# Patient Record
Sex: Male | Born: 1955 | Race: White | Hispanic: No | Marital: Single | Smoking: Never smoker
Health system: Southern US, Community
[De-identification: ages and names within clinical notes are randomized; demographics above are authoritative.]

## PROBLEM LIST (undated history)

## (undated) DIAGNOSIS — M1990 Unspecified osteoarthritis, unspecified site: Secondary | ICD-10-CM

## (undated) DIAGNOSIS — H269 Unspecified cataract: Secondary | ICD-10-CM

## (undated) DIAGNOSIS — K219 Gastro-esophageal reflux disease without esophagitis: Secondary | ICD-10-CM

## (undated) DIAGNOSIS — I10 Essential (primary) hypertension: Secondary | ICD-10-CM

## (undated) DIAGNOSIS — F419 Anxiety disorder, unspecified: Secondary | ICD-10-CM

## (undated) DIAGNOSIS — J069 Acute upper respiratory infection, unspecified: Secondary | ICD-10-CM

## (undated) DIAGNOSIS — E785 Hyperlipidemia, unspecified: Secondary | ICD-10-CM

## (undated) DIAGNOSIS — J189 Pneumonia, unspecified organism: Secondary | ICD-10-CM

## (undated) DIAGNOSIS — C801 Malignant (primary) neoplasm, unspecified: Secondary | ICD-10-CM

## (undated) HISTORY — PX: SHOULDER ARTHROSCOPY: SHX128

## (undated) HISTORY — PX: ELBOW SURGERY: SHX618

## (undated) HISTORY — DX: Hyperlipidemia, unspecified: E78.5

## (undated) HISTORY — PX: COLONOSCOPY: SHX174

## (undated) HISTORY — DX: Unspecified cataract: H26.9

## (undated) HISTORY — DX: Malignant (primary) neoplasm, unspecified: C80.1

## (undated) HISTORY — DX: Essential (primary) hypertension: I10

## (undated) HISTORY — DX: Unspecified osteoarthritis, unspecified site: M19.90

## (undated) HISTORY — PX: KNEE ARTHROSCOPY: SUR90

## (undated) HISTORY — PX: CATARACT EXTRACTION, BILATERAL: SHX1313

---

## 2000-06-26 ENCOUNTER — Encounter: Payer: Self-pay | Admitting: Orthopedic Surgery

## 2000-06-26 ENCOUNTER — Encounter: Admission: RE | Admit: 2000-06-26 | Discharge: 2000-06-26 | Payer: Self-pay | Admitting: Orthopedic Surgery

## 2000-12-01 ENCOUNTER — Encounter: Admission: RE | Admit: 2000-12-01 | Discharge: 2000-12-01 | Payer: Self-pay | Admitting: Orthopedic Surgery

## 2000-12-01 ENCOUNTER — Encounter: Payer: Self-pay | Admitting: Orthopedic Surgery

## 2003-11-01 ENCOUNTER — Encounter: Admission: RE | Admit: 2003-11-01 | Discharge: 2003-11-01 | Payer: Self-pay | Admitting: Orthopedic Surgery

## 2004-01-30 ENCOUNTER — Ambulatory Visit (HOSPITAL_COMMUNITY): Admission: RE | Admit: 2004-01-30 | Discharge: 2004-01-30 | Payer: Self-pay | Admitting: Orthopedic Surgery

## 2004-01-30 ENCOUNTER — Ambulatory Visit (HOSPITAL_BASED_OUTPATIENT_CLINIC_OR_DEPARTMENT_OTHER): Admission: RE | Admit: 2004-01-30 | Discharge: 2004-01-30 | Payer: Self-pay | Admitting: Orthopedic Surgery

## 2005-12-24 ENCOUNTER — Encounter: Admission: RE | Admit: 2005-12-24 | Discharge: 2005-12-24 | Payer: Self-pay | Admitting: Family Medicine

## 2008-12-20 ENCOUNTER — Encounter: Admission: RE | Admit: 2008-12-20 | Discharge: 2008-12-20 | Payer: Self-pay | Admitting: Occupational Medicine

## 2008-12-21 ENCOUNTER — Encounter: Admission: RE | Admit: 2008-12-21 | Discharge: 2008-12-21 | Payer: Self-pay | Admitting: Internal Medicine

## 2009-06-16 ENCOUNTER — Emergency Department (HOSPITAL_COMMUNITY): Admission: EM | Admit: 2009-06-16 | Discharge: 2009-06-16 | Payer: Self-pay | Admitting: Emergency Medicine

## 2009-06-16 ENCOUNTER — Ambulatory Visit: Payer: Self-pay | Admitting: *Deleted

## 2009-06-16 ENCOUNTER — Inpatient Hospital Stay (HOSPITAL_COMMUNITY): Admission: EM | Admit: 2009-06-16 | Discharge: 2009-06-17 | Payer: Self-pay | Admitting: *Deleted

## 2010-12-15 ENCOUNTER — Emergency Department (HOSPITAL_COMMUNITY)
Admission: EM | Admit: 2010-12-15 | Discharge: 2010-12-15 | Disposition: A | Discharge status: Home / Self Care | Payer: 59 | Attending: Emergency Medicine | Admitting: Emergency Medicine

## 2010-12-15 DIAGNOSIS — N342 Other urethritis: Secondary | ICD-10-CM | POA: Insufficient documentation

## 2010-12-15 DIAGNOSIS — R369 Urethral discharge, unspecified: Secondary | ICD-10-CM | POA: Insufficient documentation

## 2010-12-15 DIAGNOSIS — I10 Essential (primary) hypertension: Secondary | ICD-10-CM | POA: Insufficient documentation

## 2010-12-15 DIAGNOSIS — R300 Dysuria: Secondary | ICD-10-CM | POA: Insufficient documentation

## 2010-12-15 LAB — URINALYSIS, ROUTINE W REFLEX MICROSCOPIC
Hgb urine dipstick: NEGATIVE
Protein, ur: NEGATIVE mg/dL
Urobilinogen, UA: 1 mg/dL (ref 0.0–1.0)

## 2010-12-18 LAB — GC/CHLAMYDIA PROBE AMP, GENITAL: GC Probe Amp, Genital: NEGATIVE

## 2011-01-17 LAB — DIFFERENTIAL
Basophils Relative: 0 % (ref 0–1)
Eosinophils Absolute: 0.1 10*3/uL (ref 0.0–0.7)
Eosinophils Relative: 2 % (ref 0–5)
Monocytes Relative: 9 % (ref 3–12)
Neutrophils Relative %: 63 % (ref 43–77)

## 2011-01-17 LAB — RAPID URINE DRUG SCREEN, HOSP PERFORMED
Amphetamines: NOT DETECTED
Barbiturates: NOT DETECTED
Cocaine: NOT DETECTED
Opiates: NOT DETECTED
Tetrahydrocannabinol: NOT DETECTED

## 2011-01-17 LAB — POCT I-STAT, CHEM 8
Calcium, Ion: 1.15 mmol/L (ref 1.12–1.32)
Glucose, Bld: 93 mg/dL (ref 70–99)
HCT: 51 % (ref 39.0–52.0)
Hemoglobin: 17.3 g/dL — ABNORMAL HIGH (ref 13.0–17.0)
Potassium: 4 mEq/L (ref 3.5–5.1)
TCO2: 27 mmol/L (ref 0–100)

## 2011-01-17 LAB — CBC
MCHC: 34.6 g/dL (ref 30.0–36.0)
MCV: 92 fL (ref 78.0–100.0)
RBC: 5.24 MIL/uL (ref 4.22–5.81)

## 2011-01-17 LAB — URINALYSIS, ROUTINE W REFLEX MICROSCOPIC
Bilirubin Urine: NEGATIVE
Glucose, UA: NEGATIVE mg/dL
Hgb urine dipstick: NEGATIVE
Specific Gravity, Urine: 1.012 (ref 1.005–1.030)
pH: 7 (ref 5.0–8.0)

## 2011-02-28 NOTE — Op Note (Signed)
NAME:  Wayne Rhodes, ALEN NO.:  [id number]   MEDICAL RECORD NO.:  [id number]                   PATIENT TYPE:  AMB   LOCATION:  [facility]                                 FACILITY:  [facility]   PHYSICIAN:  Almedia Balls. Ranell Patrick, M.D.              DATE OF BIRTH:  24-Nov-1955   DATE OF PROCEDURE:  01/30/2004  DATE OF DISCHARGE:                                 OPERATIVE REPORT   PREOPERATIVE DIAGNOSES:  Left knee medial meniscus tear.   POSTOPERATIVE DIAGNOSES:  Left knee medial meniscus tear.   PROCEDURE:  Left knee arthroscopy with partial medial meniscectomy.   SURGEON:  Almedia Balls. Ranell Patrick, M.D.   ASSISTANT:  None.   ANESTHESIA:  Local plus Mac.   ESTIMATED BLOOD LOSS:  Minimal.   FLUIDS REPLACED:  800 mL crystalloid.   INSTRUMENT COUNT:  Correct.   COMPLICATIONS:  None.   INDICATIONS FOR PROCEDURE:  The patient is a 55 year old male who complains  of persistent medial pain after twisting injury at work.  The patient has a  MRI demonstrating possible ACL tear and meniscus tear. The patient presents  with persistent complaints despite conservative management and desires  surgical management with arthroscopy.  Informed consent was obtained.   DESCRIPTION OF PROCEDURE:  After an adequate level of anesthesia was  obtained, the patient was positioned supine on the operating table.  The  left leg was placed in an arthroscopic leg holder, right leg placed in a  well leg holder. The left leg was sterilely prepped and draped.  Diagnostic  arthroscopy was carried out through standard arthroscopic portals.  Superolateral outflow, anterolateral scope, and anteromedial working portals  all created in a similar fashion.  With infiltration of the skin with 0.25%  Marcaine with epinephrine, followed by incision with an 11 blade scalpel,  intra-articular cannula, and using blunt obturators.  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 The medial and lateral gutter was free of loose  bodies. The medial compartment was inspected, there was noted to be grade 3  changes on the femoral condyle mostly posterior deflection and some grade 2  changes on the tibial condyle.  The medial meniscus was torn, this was  complex horizontal cleavage type tear.  This was debrided mostly the tibial  portion of that tear back to stable meniscal tear. There was still femoral  sided leaf remaining intact.  That is about 20-30% of the posterior horn of  the medial meniscus had to be removed. There was some slight abrasion  chondroplasty performed removing loose flaps of cartilage on the medial  femoral condyle. The ACL was noted to be completely intact going to its  normal over the top position under normal tension.  PCL  intact, lateral compartment pristine.  At this point,  after completion of  the partial medial meniscectomy the scope was concluded.  The wounds were  closed using 4-0 Monocryl subcuticular stitches followed by sterile dressing  and Ace wrap above the knee.  The patient tolerated the surgery well.                                               Almedia Balls. Ranell Patrick, M.D.    SRN/MEDQ  D:  01/30/2004  T:  01/31/2004  Job:  [id number]

## 2011-05-09 ENCOUNTER — Other Ambulatory Visit: Payer: Self-pay | Admitting: Orthopedic Surgery

## 2011-05-09 DIAGNOSIS — M542 Cervicalgia: Secondary | ICD-10-CM

## 2011-05-11 ENCOUNTER — Ambulatory Visit
Admission: RE | Admit: 2011-05-11 | Discharge: 2011-05-11 | Disposition: A | Discharge status: Home / Self Care | Payer: 59 | Source: Ambulatory Visit | Attending: Orthopedic Surgery | Admitting: Orthopedic Surgery

## 2011-05-11 DIAGNOSIS — M542 Cervicalgia: Secondary | ICD-10-CM

## 2011-06-18 ENCOUNTER — Encounter: Payer: Self-pay | Admitting: Family Medicine

## 2011-06-18 ENCOUNTER — Ambulatory Visit (INDEPENDENT_AMBULATORY_CARE_PROVIDER_SITE_OTHER): Payer: 59 | Admitting: Family Medicine

## 2011-06-18 DIAGNOSIS — F411 Generalized anxiety disorder: Secondary | ICD-10-CM

## 2011-06-18 DIAGNOSIS — I10 Essential (primary) hypertension: Secondary | ICD-10-CM | POA: Insufficient documentation

## 2011-06-18 DIAGNOSIS — M1990 Unspecified osteoarthritis, unspecified site: Secondary | ICD-10-CM

## 2011-06-18 DIAGNOSIS — E785 Hyperlipidemia, unspecified: Secondary | ICD-10-CM

## 2011-06-18 DIAGNOSIS — F419 Anxiety disorder, unspecified: Secondary | ICD-10-CM

## 2011-06-18 DIAGNOSIS — M549 Dorsalgia, unspecified: Secondary | ICD-10-CM | POA: Insufficient documentation

## 2011-06-18 MED ORDER — ALPRAZOLAM 1 MG PO TABS: 1.0000 mg | ORAL_TABLET | Freq: Three times a day (TID) | ORAL | Status: DC | PRN

## 2011-06-18 NOTE — Progress Notes (Signed)
  Subjective:    Patient ID: Wayne Rhodes, male    DOB: [birth year], 55 y.o.   MRN: [id number]  HPI New patient to establish care. No old records for review at this time. Recently dealing with neck and low back pain. Worker's Compensation injury. Recently had what sounds like epidural injection cervical spine. Followed still by orthopedist. Using pain medication with Percocet intermittently.  Chronic problems include history of hypertension, dyslipidemia with reported hypertriglyceridemia and osteoarthritis mostly involving left knee. Previous arthroscopic surgery left knee.  Increased recent stress with health issues and divorced within the past year. Has been out of work for 18 weeks secondary to injury. History of intermittent anxiety. Previously treated with when necessary rare use of alprazolam. Denies history of depression. Denies history of illicit drug use.   Family history reviewed. Father with prostate cancer otherwise unrevealing. No known drug allergies.  Patient recently divorced. Nonsmoker. Rare alcohol use. Works with [employer] company   Review of Systems  Constitutional: Negative for fever, chills, activity change, appetite change, fatigue and unexpected weight change.  HENT: Negative for trouble swallowing.   Respiratory: Negative for cough and shortness of breath.   Cardiovascular: Negative for chest pain, palpitations and leg swelling.  Gastrointestinal: Negative for abdominal pain.  Musculoskeletal: Positive for back pain and arthralgias. Negative for myalgias.  Skin: Negative for rash.  Neurological: Negative for dizziness and syncope.  Psychiatric/Behavioral: Positive for sleep disturbance. Negative for suicidal ideas, confusion, dysphoric mood and agitation. The patient is nervous/anxious.        Objective:   Physical Exam  Constitutional: He is oriented to person, place, and time. He appears well-developed and well-nourished. No distress.  HENT:  Right  Ear: External ear normal.  Left Ear: External ear normal.  Mouth/Throat: Oropharynx is clear and moist.  Neck: Neck supple. No thyromegaly present.  Cardiovascular: Normal rate, regular rhythm and normal heart sounds.   No murmur heard. Pulmonary/Chest: Effort normal and breath sounds normal. No respiratory distress. He has no wheezes. He has no rales.  Abdominal: Soft. There is no tenderness.  Musculoskeletal: He exhibits no edema.  Lymphadenopathy:    He has no cervical adenopathy.  Neurological: He is alert and oriented to person, place, and time. No cranial nerve deficit.  Psychiatric: He has a normal mood and affect. His behavior is normal.          Assessment & Plan:  #1 hypertension. Marginal control by today's reading. Work on weight loss. Continue to monitor #2 dyslipidemia with elevated triglycerides. Continue fenofibrate. Patient states labs done recently 2 months ago. Send for old records #3 osteoarthritis mostly involving the left knee.  #4 cervical and lumbar back pain. Continue followup with orthopedist. Patient requesting refill of pain medication and we explained that we cannot refill any control pain medications until records from orthopedist and will reconsider and address at follow up in one month. #5 anxiety which appears to be more situational. Discussed limitations of chronic benzodiazepine use. limited alprazolam 1 mg #30 with no refill to use very sparingly. Get old records

## 2011-06-30 ENCOUNTER — Telehealth: Payer: Self-pay | Admitting: Family Medicine

## 2011-06-30 NOTE — Telephone Encounter (Signed)
Last filled on 06/18/11, #30 with 0 refills, pt taking TID. I spoke with pt, he has return OV scheduled for this Wed. 9/19 Pt is "having trouble with his legs"

## 2011-06-30 NOTE — Telephone Encounter (Signed)
Discuss at follow up .  Should not be taking regularly.

## 2011-06-30 NOTE — Telephone Encounter (Signed)
Pt requesting refill on ALPRAZolam (XANAX) 1 MG tablet Please send to [facility]

## 2011-06-30 NOTE — Telephone Encounter (Signed)
Pt informed

## 2011-07-02 ENCOUNTER — Encounter: Payer: Self-pay | Admitting: Family Medicine

## 2011-07-02 ENCOUNTER — Ambulatory Visit (INDEPENDENT_AMBULATORY_CARE_PROVIDER_SITE_OTHER): Payer: 59 | Admitting: Family Medicine

## 2011-07-02 VITALS — BP 128/90 | Temp 98.1°F | Wt 253.0 lb

## 2011-07-02 DIAGNOSIS — F419 Anxiety disorder, unspecified: Secondary | ICD-10-CM

## 2011-07-02 DIAGNOSIS — F411 Generalized anxiety disorder: Secondary | ICD-10-CM

## 2011-07-02 DIAGNOSIS — I10 Essential (primary) hypertension: Secondary | ICD-10-CM

## 2011-07-02 DIAGNOSIS — R252 Cramp and spasm: Secondary | ICD-10-CM

## 2011-07-02 LAB — BASIC METABOLIC PANEL
BUN: 17 mg/dL (ref 6–23)
Chloride: 102 mEq/L (ref 96–112)
Glucose, Bld: 90 mg/dL (ref 70–99)
Potassium: 4.7 mEq/L (ref 3.5–5.1)
Sodium: 142 mEq/L (ref 135–145)

## 2011-07-02 LAB — MAGNESIUM: Magnesium: 2.3 mg/dL (ref 1.5–2.5)

## 2011-07-02 MED ORDER — ALPRAZOLAM 1 MG PO TABS: 1.0000 mg | ORAL_TABLET | Freq: Three times a day (TID) | ORAL | Status: DC | PRN

## 2011-07-02 NOTE — Progress Notes (Signed)
  Subjective:    Patient ID: Wayne Rhodes, male    DOB: 08-17-1956, 55 y.o.   MRN: [id number]  HPI Patient is seen with intermittent bilateral leg pain. He had several episodes over the past month where he had muscle cramps in both calves. Monday while driving back from [place] he noticed that both forearms seemed to be tightening up and he did not have any definite weakness. He has had some chronic cervical neck problems and is scheduled to see orthopedics soon. MRI cervical spine 05/11/2011 revealed disc disease mostly C5-C6. Patient reportedly had complete physical about 3 months ago with lab workup then. No history of thyroid problems.  Patient has had some recent increased stressors. Health problems have interfered with work. Impending divorce. We wrote for limited Xanax last visit and he has used this fairly consistently though we had recommended when necessary. He denies depression. Denies history of illicit drug use.   Review of Systems  Constitutional: Positive for fatigue. Negative for fever and chills.  Respiratory: Negative for shortness of breath.   Cardiovascular: Negative for chest pain.  Genitourinary: Negative for dysuria.  Musculoskeletal: Positive for myalgias and back pain. Negative for joint swelling and arthralgias.  Skin: Negative for rash.  Neurological: Negative for dizziness and headaches.       Objective:   Physical Exam  Constitutional: He is oriented to person, place, and time. He appears well-developed and well-nourished. No distress.  Neck: Neck supple. No thyromegaly present.  Cardiovascular: Normal rate and regular rhythm.   Pulmonary/Chest: Effort normal and breath sounds normal. No respiratory distress. He has no wheezes. He has no rales.  Musculoskeletal: He exhibits no edema.       No evidence for knee joint edema or erythema. Full range of motion all joints upper and lower extremities  Lymphadenopathy:    He has no cervical adenopathy.    Neurological: He is alert and oriented to person, place, and time. He has normal reflexes. No cranial nerve deficit.       Full strength upper and lower extremities.  Psychiatric: He has a normal mood and affect. His behavior is normal.          Assessment & Plan:  #1 recurrent muscle cramps upper and lower extremities. Check basic metabolic panel and magnesium level. Increase fluid intake. #2 situational anxiety. Discussed limiting medications like alprazolam. Consider serotonin reuptake inhibitor if anxiety persists. Agreed to one refill of alprazolam 1 mg #30 with no further refills.

## 2011-07-17 ENCOUNTER — Ambulatory Visit: Payer: 59 | Admitting: Family Medicine

## 2011-07-18 ENCOUNTER — Other Ambulatory Visit: Payer: Self-pay | Admitting: *Deleted

## 2011-07-18 NOTE — Telephone Encounter (Signed)
Alprazolam refill request, last filled on 9/19, #30 with 0 refills [facility]

## 2011-07-21 ENCOUNTER — Telehealth: Payer: Self-pay | Admitting: Family Medicine

## 2011-07-21 NOTE — Telephone Encounter (Signed)
Refill Xanax 1mg  tid #90 [facility].

## 2011-07-21 NOTE — Telephone Encounter (Signed)
He should not be taking Xanax regularly as discussed.  This is very habit forming and has high abuse potential.  If he is requiring daily medication for anxiety would start Sertraline 50 mg daily and office follow up one month to reassess.  He needs to understand this will take up to 3 weeks to see good effect.

## 2011-07-21 NOTE — Telephone Encounter (Signed)
Pt last OV 10/4, last filled on 9/19, #30 with 0 refills.  Pt requesting #90 plus refills.

## 2011-07-22 MED ORDER — SERTRALINE HCL 50 MG PO TABS: 50.0000 mg | ORAL_TABLET | Freq: Every day | ORAL | Status: DC

## 2011-07-22 NOTE — Telephone Encounter (Signed)
Pt informed, will order Sertraline 50.  Instructed pt to follow-up in one month to reassess

## 2011-07-29 ENCOUNTER — Telehealth: Payer: Self-pay | Admitting: Family Medicine

## 2011-07-29 NOTE — Telephone Encounter (Signed)
Pt stated sertraline 50 mg is not working. Pt would like to go back to xanax. Please call [facility] [phone]

## 2011-07-30 NOTE — Telephone Encounter (Signed)
He should not be taking Xanax regularly as discussed.

## 2011-07-30 NOTE — Telephone Encounter (Signed)
We should titrate Sertraline to 100 mg if no improvement with 50 mg

## 2011-07-30 NOTE — Telephone Encounter (Signed)
Pt informed and agreed to try Sertraline 100mg  daily.  I will enter a Sertraline 100mg  to pt med list for refills

## 2011-07-30 NOTE — Telephone Encounter (Signed)
Addended by: Melchor Amour on: 07/30/2011 04:07 PM   Modules accepted: Orders

## 2011-08-05 ENCOUNTER — Other Ambulatory Visit: Payer: Self-pay | Admitting: *Deleted

## 2011-08-05 NOTE — Telephone Encounter (Signed)
Refill request faxed Alprazolam 1 mg, last filled 07/02/11 at new pt OV, #30 with 0 refills

## 2011-08-05 NOTE — Telephone Encounter (Signed)
Denied.  She prior phone notes.  We titrated his sertraline to 100 mg . He needs office follow up to reassess.

## 2011-08-06 NOTE — Telephone Encounter (Signed)
Rx faxed back denied

## 2011-08-12 ENCOUNTER — Telehealth: Payer: Self-pay | Admitting: *Deleted

## 2011-08-12 DIAGNOSIS — F419 Anxiety disorder, unspecified: Secondary | ICD-10-CM

## 2011-08-12 NOTE — Telephone Encounter (Signed)
Pt states the Zoloft is not working well.  He cannot sleep and his panic attacks are worse.  Any suggestions?  He feels the Xanax is the only thing that worked.

## 2011-08-12 NOTE — Telephone Encounter (Signed)
Yes.  I suggest that he see Dr Emerson Monte to explore other options.  We will set up if he agrees.

## 2011-08-13 NOTE — Telephone Encounter (Signed)
Notified pt. 

## 2011-09-08 ENCOUNTER — Ambulatory Visit: Payer: 59 | Admitting: Family Medicine

## 2011-09-10 ENCOUNTER — Ambulatory Visit (INDEPENDENT_AMBULATORY_CARE_PROVIDER_SITE_OTHER): Payer: 59 | Admitting: Family Medicine

## 2011-09-10 ENCOUNTER — Encounter: Payer: Self-pay | Admitting: Family Medicine

## 2011-09-10 VITALS — BP 130/94 | Temp 98.3°F | Wt 258.0 lb

## 2011-09-10 DIAGNOSIS — R05 Cough: Secondary | ICD-10-CM

## 2011-09-10 DIAGNOSIS — R059 Cough, unspecified: Secondary | ICD-10-CM

## 2011-09-10 DIAGNOSIS — M25512 Pain in left shoulder: Secondary | ICD-10-CM

## 2011-09-10 DIAGNOSIS — M25519 Pain in unspecified shoulder: Secondary | ICD-10-CM

## 2011-09-10 DIAGNOSIS — F419 Anxiety disorder, unspecified: Secondary | ICD-10-CM

## 2011-09-10 DIAGNOSIS — F411 Generalized anxiety disorder: Secondary | ICD-10-CM

## 2011-09-10 MED ORDER — HYDROCODONE-HOMATROPINE 5-1.5 MG/5ML PO SYRP: 5.0000 mL | ORAL_SOLUTION | Freq: Four times a day (QID) | ORAL | Status: AC | PRN

## 2011-09-10 MED ORDER — CLONAZEPAM 0.5 MG PO TABS: 0.5000 mg | ORAL_TABLET | Freq: Two times a day (BID) | ORAL | Status: DC | PRN

## 2011-09-10 NOTE — Progress Notes (Signed)
  Subjective:    Patient ID: Wayne Rhodes, male    DOB: 1956-01-22, 55 y.o.   MRN: [id number]  HPI  Patient seen for the following issues  Acute issue of a 3 to four-day history of dry cough. Cough worse at night. Poor sleep secondary to cough. No fever. No dyspnea or wheezing. Some associated nasal congestion and intermittent headache. Nonsmoker. No relief with over-the-counter cough medications such as Mucinex.  Left shoulder pain for the past few days. No injury. Denies chest pain. Worse with abduction. Started after his cough started. Denies any upper extremity weakness. Symptoms are mild to moderate.  Chronic anxiety for several years. No specific situational stressors but he has undergone divorce the past year. We tried sertraline that he did not see any results and had some side effects. Patient states he has seen psychiatrist and only counseling was given. He has daily pervasive anxiety with no clear triggers. Has previously gotten good relief with alprazolam but we explained our reluctance to maintain chronically. Denies depression this time. No history of substance abuse.  He does not plan to go back to psychologist or psychiatrist  Past Medical History  Diagnosis Date  . Hyperlipidemia   . Hypertension    Past Surgical History  Procedure Date  . Knee arthroscopy     left  . Elbow surgery     both elbow surgery    reports that he has never smoked. He does not have any smokeless tobacco history on file. His alcohol and drug histories not on file. family history includes Cancer in his father. Allergies  Allergen Reactions  . Tylenol (Acetaminophen)     GI problems      Review of Systems  Constitutional: Negative for fever, chills, appetite change and unexpected weight change.  HENT: Positive for congestion.   Respiratory: Positive for cough. Negative for shortness of breath and wheezing.   Cardiovascular: Negative for chest pain, palpitations and leg swelling.    Neurological: Negative for dizziness and syncope.  Psychiatric/Behavioral: Negative for suicidal ideas, confusion, dysphoric mood and agitation. The patient is nervous/anxious.        Objective:   Physical Exam  Constitutional: He appears well-developed and well-nourished.  HENT:  Right Ear: External ear normal.  Left Ear: External ear normal.  Mouth/Throat: Oropharynx is clear and moist.  Neck: Neck supple.  Cardiovascular: Normal rate, regular rhythm and normal heart sounds.   Pulmonary/Chest: Effort normal and breath sounds normal. No respiratory distress. He has no wheezes. He has no rales.  Musculoskeletal: He exhibits no edema.       Left shoulder reveals some pain with abduction against resistance. No reproducible tenderness to palpation. No a.c. joint tenderness.          Assessment & Plan:  #1 cough probably related to acute viral bronchitis. Hycodan for nighttime suppression. Reviewed possible side effects  #2 chronic anxiety. Previously intolerant to sertraline. Previous relief with alprazolam. We've agreed to trial of Klonopin 0.5 mg twice daily #60 with 1 refill and reassess in one month #3 left shoulder pain. Suspect rotator cuff tendinitis. Try over-the-counter anti-inflammatory short-term and touch base 2-3 weeks if not improving

## 2011-09-11 ENCOUNTER — Encounter (HOSPITAL_COMMUNITY): Payer: Self-pay | Admitting: Respiratory Therapy

## 2011-09-13 ENCOUNTER — Encounter: Payer: Self-pay | Admitting: Family Medicine

## 2011-09-13 ENCOUNTER — Ambulatory Visit (INDEPENDENT_AMBULATORY_CARE_PROVIDER_SITE_OTHER): Payer: 59 | Admitting: Family Medicine

## 2011-09-13 VITALS — BP 124/80 | HR 109 | Temp 97.6°F | Wt 255.0 lb

## 2011-09-13 DIAGNOSIS — I10 Essential (primary) hypertension: Secondary | ICD-10-CM

## 2011-09-13 DIAGNOSIS — T783XXA Angioneurotic edema, initial encounter: Secondary | ICD-10-CM

## 2011-09-13 DIAGNOSIS — F411 Generalized anxiety disorder: Secondary | ICD-10-CM

## 2011-09-13 DIAGNOSIS — F419 Anxiety disorder, unspecified: Secondary | ICD-10-CM

## 2011-09-13 MED ORDER — ALPRAZOLAM 1 MG PO TABS: 1.0000 mg | ORAL_TABLET | Freq: Three times a day (TID) | ORAL | Status: DC | PRN

## 2011-09-13 MED ORDER — LOSARTAN POTASSIUM-HCTZ 50-12.5 MG PO TABS: 1.0000 | ORAL_TABLET | Freq: Every day | ORAL | Status: DC

## 2011-09-13 NOTE — Progress Notes (Signed)
  Subjective:    Patient ID: Wayne Rhodes, male    DOB: 1956/05/20, 55 y.o.   MRN: [id number]  HPI Here for the onset of lips swelling last night. No SOB or wheezing. He took 2 Benadryls last night and today his lips are much better. He thinks this is a reaction to Clonazepam, which was given to him several days ago by Dr. Caryl Never. He has been dealing with some anxiety, and he has taken Xanax successfully in the past. However Clonazepam was tried instead. Of note he has been on Quinapril HCT for about 4 years.    Review of Systems  Constitutional: Negative.   HENT: Negative.   Eyes: Negative.   Respiratory: Negative.        Objective:   Physical Exam  Constitutional: He appears well-developed and well-nourished.  HENT:       Both lips are slightly swollen  Cardiovascular: Normal rate, regular rhythm, normal heart sounds and intact distal pulses.   Pulmonary/Chest: Effort normal and breath sounds normal.          Assessment & Plan:  I think his ACE inhibitor is the most likely cause of his angioedema, although he has been taking Clonazepam for the first time. We decided to stop both of these meds just in case. I wrote for a one month supply of Xanax for him to use until he will follow up with Dr. Caryl Never. Switch to Losartan HCT and this will be followed up in one month as well. Use Benadryl prn

## 2011-09-15 ENCOUNTER — Telehealth: Payer: Self-pay | Admitting: Family Medicine

## 2011-09-15 NOTE — Telephone Encounter (Signed)
Triage Record Num: [id number] Operator: Hillary Bow Patient Name: Wayne Rhodes Call Date & Time: 09/12/2011 8:19:23PM Patient Phone: [phone] PCP: Evelena Peat Patient Gender: Male PCP Fax : [phone] Patient DOB: [birth year] [facility] Name: [facility] Reason for Call: ER CALL. Caller: [name]/Patient; PCP: [facility] ([facility]), [facility] Fortis.; CB#: [phone]; Call Reason: Lips Swelling; Sx Onset: 09/12/2011; Home treatment(s) tried: Antihistamine, ; Did home treatment help?: No; Sx Notes: Pt calling about having Lip swelling after started Clonazepam on 09-11-11. Pt complains of intermittent CP. Advised Pt to go to ED d/t Lip swelling w/ CP. Gave Pt [facility] # for f/u appt on 12-1, Pt will call [facility] for an appt. Home care advice given. Protocol(s) Used: Allergic Reaction, Severe Recommended Outcome per Protocol: Activate EMS 911 Reason for Outcome: Signs/symptoms of anaphylaxis Care Advice: ~ 11/30/

## 2011-09-16 NOTE — H&P (Signed)
Wayne Rhodes 09/16/2011 9:36 AM Location: [facility] Patient #: [id number] DOB: 04-Dec-1955 Married / Language: [place] / Race: White Male   History of Present Illness(Toyia Jelinek J Marcelline Mates, PA-C; 09/16/2011 10:06 AM) The patient is a 55 year old male who comes in today for a preoperative History and Physical. The patient is scheduled for a ACDF C5-6 (for neck and mostly left arm pain) to be performed by Dr. Debria Garret D. Shon Baton, MD at [facility] on Thursday, September 25, 2011 at 7:30AM .    Family History(Johnnay Pleitez J [facility], PA-C; 09/16/2011 10:33 AM) Cancer. mother and father   Social History(Kaede Clendenen Dierdre Highman, PA-C; 09/16/2011 10:33 AM) Alcohol use. current drinker; drinks beer; less than 5 per week Children. 2 Current work status. retired Financial planner (Currently). no Drug/Alcohol Rehab (Previously). no Exercise. Exercises daily; does running / walking Illicit drug use. no Living situation. live alone Marital status. divorced Number of flights of stairs before winded. 4-5 Pain Contract. no Tobacco / smoke exposure. yes Tobacco use. never smoker   Past Surgical History(Remy Dia J [facility], PA-C; 09/16/2011 10:33 AM) Arthroscopy of Knee. left   Other Problems(Kalum Minner J Sigourney Portillo, PA-C; 09/16/2011 10:33 AM) Anxiety Disorder High blood pressure Hypercholesterolemia   Review of Systems(Alyzza Andringa J Jakin Pavao, PA-C; 09/16/2011 10:33 AM) General:Not Present- Chills, Fever, Night Sweats, Appetite Loss, Fatigue, Feeling sick, Weight Gain and Weight Loss. Skin:Not Present- Itching, Rash, Skin Color Changes, Ulcer, Psoriasis and Change in Hair or Nails. HEENT:Not Present- Sensitivity to light, Hearing problems, Nose Bleed and Ringing in the Ears. Neck:Not Present- Swollen Glands and Neck Mass. Respiratory:Not Present- Snoring, Chronic Cough, Bloody sputum and Dyspnea. Cardiovascular:Present- Leg Cramps. Not Present- Shortness of Breath, Chest Pain, Swelling  of Extremities and Palpitations. Gastrointestinal:Not Present- Bloody Stool, Heartburn, Abdominal Pain, Vomiting, Nausea and Incontinence of Stool. Male Genitourinary:Not Present- Blood in Urine, Frequency, Incontinence and Nocturia. Musculoskeletal:Present- Muscle Pain, Joint Stiffness and Back Pain. Not Present- Muscle Weakness, Joint Swelling and Joint Pain. Neurological:Present- Tingling and Numbness. Not Present- Burning, Tremor, Headaches and Dizziness. Psychiatric:Present- Anxiety. Not Present- Depression and Memory Loss. Endocrine:Not Present- Cold Intolerance, Heat Intolerance, Excessive hunger and Excessive Thirst. Hematology:Not Present- Abnormal Bleeding, Anemia, Blood Clots and Easy Bruising.   Vitals(Denise L Reamey; 09/16/2011 9:43 AM) 09/16/2011 9:40 AM Weight: 254 lb Height: 75 in Body Surface Area: 2.47 m Body Mass Index: 31.75 kg/m BP: 142/93 (Sitting, Left Arm, Standard)    Physical Exam(Glynn Freas J Anterio Scheel, PA-C; 09/16/2011 10:33 AM) The physical exam findings are as follows:   General General Appearance- pleasant. Not in acute distress. Orientation- Oriented X3. Build & Nutrition- Well nourished and Well developed. Posture- Normal posture. Gait- Normal. Mental Status- Alert.   Integumentary General Characteristics:Surgical Scars- no surgical scar evidence of previous cervical surgery. Cervical Spine- Skin examination of the cervical spine is without deformity, skin lesions, lacerations or abrasions.   Head and Neck Neck Global Assessment- supple. no lymphadenopathy and no nucchal rigidty.   Eye Pupil- Bilateral- Normal, Direct reaction to light normal, Equal and Regular. Motion- Bilateral- EOMI.   Chest and Lung Exam Auscultation: Breath sounds:- Clear.   Cardiovascular Auscultation:Rhythm- Regular rate and rhythm. Heart Sounds- Normal heart sounds.   Abdomen Palpation/Percussion:Palpation and Percussion  of the abdomen reveal - Non Tender, No Rebound tenderness and Soft.   Peripheral Vascular Upper Extremity: Palpation:Radial pulse- Bilateral- 2+.   Neurologic Sensation:Upper Extremity- Bilateral- sensation is intact in the upper extremity. Reflexes:Biceps Reflex- Bilateral- 1+. Brachioradialis Reflex- Bilateral- 1+. Triceps Reflex- Bilateral- 1+. Clonus- Bilateral- clonus not present. Hoffman's  Sign- Bilateral- Hoffman's sign not present.   Musculoskeletal Spine/Ribs/Pelvis Cervical Spine : Inspection and Palpation:Tenderness- no bony tenderness to palpation, generalized tenderness about the cervical spine, left upper trapezius area tender to palpation and left cervical paraspinals tender to palpation. bony/soft tissue palpation of the cervical spine and shoulders does not recreate their typical pain. Strength and Tone: Strength:Deltoid- Bilateral- 5/5. Biceps- Bilateral- 5/5. Triceps- Bilateral- 5/5. Wrist Extension- Bilateral- 5/5. Hand Grip- Bilateral- 5/5. Heel walk- Bilateral- able to heel walk without difficulty. Toe Walk- Bilateral- able to walk on toes without difficulty. Heel-Toe Walk- Bilateral- able to heel-toe walk without difficulty. ROM- Flexion- Moderately Decreased and painful. Extension- Moderately Decreased and painful. Left Lateral Flexion - Moderately Decreased and painful. Right Lateral Flexion - Moderately Decreased and painful. Left Rotation - Moderately Decreased and painful. Right Rotation - Moderately Decreased and painful. Pain:- neither flexion or extension is more painful than the other. Special Testing- axial compression test negative. Non-Anatomic Signs- No non-anatomic signs present. Upper Extremity Range of Motion:- No truesholder pain with IR/ER of the shoulders.   Assessment & Plan(Winifred Balogh J [facility], PA-C; 09/16/2011 10:35 AM) Note: unfortunately conservative measures consisting of observation, activity  modification, physical therapy, oral pain medications and injection therapy have failed to alleviate his symtpoms and because of the ongoing nature of the pain and the decrease in his quality of life, he wishes to proceed with surgical intervention. Risks/benefits/alternative treatments and expectations following surgery have been reviewed with the patient by Dr. Shon Baton.  MRI of the cervical spine dated 05/11/11 demonstrates at C5-6 ordinary spondylosis with endplate osteophytes and bulging disc material. Retrolisthesis of 2mm. Narrowing of the ventral subarachnoid space but no compression of the cord. Mild foraminal narrowing by osteophytes, not grossly compressive.   He has not yet completed his pre-op hospital requirements. He will be fitted for an ASPEN collar by therapy and knows to bring the brace with him on the day of surgery. He will be cleared medically at the time of pre-op by anesthesia.  Plan at this time, is to proceed with surgery as scheduled. All of his questions were encouraged, addressed and answered.   Signed electronically by Gwinda Maine, PA-C (09/16/2011 10:37 AM)   Wayne Rhodes 08/01/2011 10:30 AM Location: [facility] Patient #: [id number] DOB: 01/02/1956 Married / Language: Undefined / Race: Undefined Male   History of Present Illness(Laurie Lupita Raider; 08/01/2011 10:33 AM) The patient is a 55 year old male who presents today for follow up of their neck. The patient is being followed for their central neck pain. The patient presents today following ESI. The patient indicates that they have questions or concerns today regarding their progress at this point (and the fact that he is having bilateral arm pain, numbness and cramping in his hands).    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 05/23/2011   Social History(Laurie J Rochele Raring; 08/01/2011 10:33 AM) Tobacco/Smoke Exposure. None.   Assessment & Plan(DAHARI D BROOKS, MD; 08/01/2011 11:05 AM) Cervical Disc Degeneration (722.4) Current Plans l Risks of surgery include, but are not limited to: Throat pain,swallowing difficulty, hoarseness or change in voice, Death, stroke, paralysis, nerve root damage/injury, bleeding, blood clots, loss of bowel/bladder control, hardware failure, or malposition, spinal fluid leak, adjacent segment disease, non-union, need for further surgery, ongoing or worse pain, infection and recurrent disc herniation  l Started Percocet  5-325MG , 1 Tablet three times daily, as needed, #90, 08/01/2011, No Refill.  Note: patient continues to have significant pain loss of function and quality of life Only temporary relief with injections. Dr Ethelene Hal doesn't feel as though more injections will be helpful. Clinical exam - no change. continue to have C6 radicular pain, and decreased sensation to light touch Reviewed alll risks and benefits of surgery - plan on C5/6 ACDF   Signed electronically by Alvy Beal, MD (08/01/2011 4:48 PM)

## 2011-09-17 ENCOUNTER — Other Ambulatory Visit: Payer: Self-pay

## 2011-09-17 ENCOUNTER — Encounter (HOSPITAL_COMMUNITY)
Admission: RE | Admit: 2011-09-17 | Discharge: 2011-09-17 | Disposition: A | Discharge status: Home / Self Care | Payer: 59 | Source: Ambulatory Visit | Attending: Orthopedic Surgery | Admitting: Orthopedic Surgery

## 2011-09-17 ENCOUNTER — Ambulatory Visit (HOSPITAL_COMMUNITY)
Admission: RE | Admit: 2011-09-17 | Discharge: 2011-09-17 | Disposition: A | Discharge status: Home / Self Care | Payer: 59 | Source: Ambulatory Visit | Attending: Physician Assistant | Admitting: Physician Assistant

## 2011-09-17 ENCOUNTER — Encounter (HOSPITAL_COMMUNITY): Payer: Self-pay

## 2011-09-17 ENCOUNTER — Ambulatory Visit (HOSPITAL_COMMUNITY)
Admission: RE | Admit: 2011-09-17 | Discharge: 2011-09-17 | Disposition: A | Discharge status: Home / Self Care | Payer: 59 | Source: Ambulatory Visit | Attending: Anesthesiology | Admitting: Anesthesiology

## 2011-09-17 DIAGNOSIS — Z01812 Encounter for preprocedural laboratory examination: Secondary | ICD-10-CM | POA: Insufficient documentation

## 2011-09-17 DIAGNOSIS — M5030 Other cervical disc degeneration, unspecified cervical region: Secondary | ICD-10-CM | POA: Insufficient documentation

## 2011-09-17 DIAGNOSIS — Z01818 Encounter for other preprocedural examination: Secondary | ICD-10-CM | POA: Insufficient documentation

## 2011-09-17 HISTORY — DX: Pneumonia, unspecified organism: J18.9

## 2011-09-17 HISTORY — DX: Gastro-esophageal reflux disease without esophagitis: K21.9

## 2011-09-17 HISTORY — DX: Anxiety disorder, unspecified: F41.9

## 2011-09-17 HISTORY — DX: Acute upper respiratory infection, unspecified: J06.9

## 2011-09-17 LAB — CBC
HCT: 50.8 % (ref 39.0–52.0)
Hemoglobin: 17.6 g/dL — ABNORMAL HIGH (ref 13.0–17.0)
WBC: 7.5 10*3/uL (ref 4.0–10.5)

## 2011-09-17 LAB — BASIC METABOLIC PANEL
BUN: 14 mg/dL (ref 6–23)
Chloride: 104 mEq/L (ref 96–112)
GFR calc Af Amer: 76 mL/min — ABNORMAL LOW (ref >90)
Glucose, Bld: 96 mg/dL (ref 70–99)
Potassium: 4.3 mEq/L (ref 3.5–5.1)

## 2011-09-17 NOTE — Pre-Procedure Instructions (Signed)
20 Wayne Rhodes  09/17/2011   Your procedure is scheduled on:  09-25-11 Thursday  Report to [facility] at 5:30 AM.  Call this number if you have problems the morning of surgery: [phone]   Remember:   Do not eat food:After Midnight.  May have clear liquids: up to 4 Hours before arrival.  Clear liquids include soda, tea, black coffee, apple or grape juice, broth.  Take these medicines the morning of surgery with A SIP OF WATER: none   Do not wear jewelry, make-up or nail polish.  Do not wear lotions, powders, or perfumes. You may wear deodorant.  Do not shave 48 hours prior to surgery.  Do not bring valuables to the hospital.  Contacts, dentures or bridgework may not be worn into surgery.  Leave suitcase in the car. After surgery it may be brought to your room.  For patients admitted to the hospital, checkout time is 11:00 AM the day of discharge.   Patients discharged the day of surgery will not be allowed to drive home.  Name and phone number of your driver: airik goodlin - son [phone]  Special Instructions: Incentive Spirometry - Practice and bring it with you on the day of surgery. and CHG Shower Use Special Wash: 1/2 bottle night before surgery and 1/2 bottle morning of surgery.   Please read over the following fact sheets that you were given: Pain Booklet, Coughing and Deep Breathing, MRSA Information and Surgical Site Infection Prevention

## 2011-09-19 NOTE — Consult Note (Signed)
Anesthesia:  Patient is a 55 year old male for ACDF on 09/25/11.  His history includes hyperlipidemia, HTN, GERD, anxiety, prior hx of PNA.  I was asked to review his preoperative labs and EKG.  Labs are acceptable from an Anesthesia standpoint. CXR shows no active disease.  EKG shows NSR, LAD, cannot rule out prior anterior infarct.  The EKG changes appear stable when compared to his 2010 and 2005 EKGs.  No CV symptoms were reported at PAT.   Plan to proceed if no new symptoms.

## 2011-09-24 MED ORDER — CEFAZOLIN SODIUM-DEXTROSE 2-3 GM-% IV SOLR
2.0000 g | INTRAVENOUS | Status: AC
  Administered 2011-09-25: 2 g via INTRAVENOUS
  Filled 2011-09-24: qty 50

## 2011-09-24 MED ORDER — LACTATED RINGERS IV SOLN: INTRAVENOUS | Status: DC

## 2011-09-25 ENCOUNTER — Ambulatory Visit (HOSPITAL_COMMUNITY): Payer: 59

## 2011-09-25 ENCOUNTER — Encounter (HOSPITAL_COMMUNITY)
Admission: RE | Disposition: A | Discharge status: Home / Self Care | Payer: Self-pay | Source: Ambulatory Visit | Attending: Orthopedic Surgery

## 2011-09-25 ENCOUNTER — Encounter (HOSPITAL_COMMUNITY): Payer: Self-pay | Admitting: Vascular Surgery

## 2011-09-25 ENCOUNTER — Encounter (HOSPITAL_COMMUNITY): Payer: Self-pay | Admitting: *Deleted

## 2011-09-25 ENCOUNTER — Ambulatory Visit (HOSPITAL_COMMUNITY)
Admission: RE | Admit: 2011-09-25 | Discharge: 2011-09-26 | Disposition: A | Discharge status: Home / Self Care | Payer: 59 | Source: Ambulatory Visit | Attending: Orthopedic Surgery | Admitting: Orthopedic Surgery

## 2011-09-25 ENCOUNTER — Ambulatory Visit (HOSPITAL_COMMUNITY): Payer: 59 | Admitting: Vascular Surgery

## 2011-09-25 DIAGNOSIS — F411 Generalized anxiety disorder: Secondary | ICD-10-CM | POA: Insufficient documentation

## 2011-09-25 DIAGNOSIS — I10 Essential (primary) hypertension: Secondary | ICD-10-CM | POA: Insufficient documentation

## 2011-09-25 DIAGNOSIS — M47812 Spondylosis without myelopathy or radiculopathy, cervical region: Secondary | ICD-10-CM | POA: Insufficient documentation

## 2011-09-25 DIAGNOSIS — E785 Hyperlipidemia, unspecified: Secondary | ICD-10-CM | POA: Insufficient documentation

## 2011-09-25 DIAGNOSIS — M5412 Radiculopathy, cervical region: Secondary | ICD-10-CM

## 2011-09-25 DIAGNOSIS — K219 Gastro-esophageal reflux disease without esophagitis: Secondary | ICD-10-CM | POA: Insufficient documentation

## 2011-09-25 HISTORY — PX: ANTERIOR CERVICAL DECOMP/DISCECTOMY FUSION: SHX1161

## 2011-09-25 SURGERY — ANTERIOR CERVICAL DECOMPRESSION/DISCECTOMY FUSION 1 LEVEL
Anesthesia: General | Site: Neck | Wound class: Clean

## 2011-09-25 MED ORDER — MEPERIDINE HCL 25 MG/ML IJ SOLN: 6.2500 mg | INTRAMUSCULAR | Status: DC | PRN

## 2011-09-25 MED ORDER — HYDROCHLOROTHIAZIDE 12.5 MG PO CAPS
12.5000 mg | ORAL_CAPSULE | Freq: Every day | ORAL | Status: DC
  Administered 2011-09-25 – 2011-09-26 (×2): 12.5 mg via ORAL
  Filled 2011-09-25 (×2): qty 1

## 2011-09-25 MED ORDER — THROMBIN 20000 UNITS EX KIT
PACK | CUTANEOUS | Status: DC | PRN
  Administered 2011-09-25: 07:00:00 via TOPICAL

## 2011-09-25 MED ORDER — METHOCARBAMOL 500 MG PO TABS: 500.0000 mg | ORAL_TABLET | Freq: Four times a day (QID) | ORAL | Status: DC | PRN

## 2011-09-25 MED ORDER — MIDAZOLAM HCL 5 MG/5ML IJ SOLN
INTRAMUSCULAR | Status: DC | PRN
  Administered 2011-09-25: 2 mg via INTRAVENOUS

## 2011-09-25 MED ORDER — LOSARTAN POTASSIUM 50 MG PO TABS
50.0000 mg | ORAL_TABLET | Freq: Every day | ORAL | Status: DC
  Administered 2011-09-25 – 2011-09-26 (×2): 50 mg via ORAL
  Filled 2011-09-25 (×2): qty 1

## 2011-09-25 MED ORDER — ONDANSETRON HCL 4 MG/2ML IJ SOLN
4.0000 mg | INTRAMUSCULAR | Status: DC | PRN
  Administered 2011-09-25 – 2011-09-26 (×2): 4 mg via INTRAVENOUS
  Filled 2011-09-25: qty 2

## 2011-09-25 MED ORDER — QUINAPRIL-HYDROCHLOROTHIAZIDE 20-25 MG PO TABS: 1.0000 | ORAL_TABLET | Freq: Every day | ORAL | Status: DC

## 2011-09-25 MED ORDER — LOSARTAN POTASSIUM-HCTZ 50-12.5 MG PO TABS: 1.0000 | ORAL_TABLET | Freq: Every day | ORAL | Status: DC

## 2011-09-25 MED ORDER — SODIUM CHLORIDE 0.9 % IV SOLN: 250.0000 mL | INTRAVENOUS | Status: DC

## 2011-09-25 MED ORDER — MENTHOL 3 MG MT LOZG: 1.0000 | LOZENGE | OROMUCOSAL | Status: DC | PRN

## 2011-09-25 MED ORDER — SODIUM CHLORIDE 0.9 % IR SOLN
Status: DC | PRN
  Administered 2011-09-25: 1000 mL

## 2011-09-25 MED ORDER — VECURONIUM BROMIDE 10 MG IV SOLR
INTRAVENOUS | Status: DC | PRN
  Administered 2011-09-25: 2 mg via INTRAVENOUS
  Administered 2011-09-25 (×2): 1 mg via INTRAVENOUS

## 2011-09-25 MED ORDER — GLYCOPYRROLATE 0.2 MG/ML IJ SOLN
INTRAMUSCULAR | Status: DC | PRN
  Administered 2011-09-25: .8 mg via INTRAVENOUS

## 2011-09-25 MED ORDER — BUPIVACAINE-EPINEPHRINE 0.25% -1:200000 IJ SOLN
INTRAMUSCULAR | Status: DC | PRN
  Administered 2011-09-25: 1.5 mL

## 2011-09-25 MED ORDER — ONDANSETRON HCL 4 MG/2ML IJ SOLN
INTRAMUSCULAR | Status: DC | PRN
  Administered 2011-09-25: 4 mg via INTRAVENOUS

## 2011-09-25 MED ORDER — DEXAMETHASONE SODIUM PHOSPHATE 4 MG/ML IJ SOLN
4.0000 mg | Freq: Four times a day (QID) | INTRAMUSCULAR | Status: DC
  Administered 2011-09-25 – 2011-09-26 (×3): 4 mg via INTRAVENOUS
  Filled 2011-09-25 (×7): qty 1

## 2011-09-25 MED ORDER — PANTOPRAZOLE SODIUM 40 MG IV SOLR
40.0000 mg | Freq: Every day | INTRAVENOUS | Status: DC
  Administered 2011-09-25: 40 mg via INTRAVENOUS
  Filled 2011-09-25 (×2): qty 40

## 2011-09-25 MED ORDER — ACETAMINOPHEN 10 MG/ML IV SOLN
1000.0000 mg | Freq: Four times a day (QID) | INTRAVENOUS | Status: DC
  Administered 2011-09-25 – 2011-09-26 (×3): 1000 mg via INTRAVENOUS
  Filled 2011-09-25 (×3): qty 100

## 2011-09-25 MED ORDER — LACTATED RINGERS IV SOLN: INTRAVENOUS | Status: DC

## 2011-09-25 MED ORDER — OXYCODONE HCL 5 MG PO TABS
10.0000 mg | ORAL_TABLET | ORAL | Status: DC | PRN
  Administered 2011-09-25 – 2011-09-26 (×4): 10 mg via ORAL
  Filled 2011-09-25 (×4): qty 2

## 2011-09-25 MED ORDER — DEXAMETHASONE SODIUM PHOSPHATE 4 MG/ML IJ SOLN
INTRAMUSCULAR | Status: DC | PRN
  Administered 2011-09-25: 10 mg via INTRAVENOUS

## 2011-09-25 MED ORDER — SODIUM CHLORIDE 0.9 % IJ SOLN: 3.0000 mL | Freq: Two times a day (BID) | INTRAMUSCULAR | Status: DC

## 2011-09-25 MED ORDER — PROPOFOL 10 MG/ML IV EMUL
INTRAVENOUS | Status: DC | PRN
  Administered 2011-09-25: 150 mg via INTRAVENOUS

## 2011-09-25 MED ORDER — METHOCARBAMOL 100 MG/ML IJ SOLN
500.0000 mg | Freq: Four times a day (QID) | INTRAVENOUS | Status: DC | PRN
  Administered 2011-09-25: 500 mg via INTRAVENOUS
  Filled 2011-09-25: qty 5

## 2011-09-25 MED ORDER — LACTATED RINGERS IV SOLN
INTRAVENOUS | Status: DC | PRN
  Administered 2011-09-25 (×2): via INTRAVENOUS

## 2011-09-25 MED ORDER — FENTANYL CITRATE 0.05 MG/ML IJ SOLN
INTRAMUSCULAR | Status: DC | PRN
  Administered 2011-09-25: 150 ug via INTRAVENOUS
  Administered 2011-09-25: 100 ug via INTRAVENOUS

## 2011-09-25 MED ORDER — PHENOL 1.4 % MT LIQD
1.0000 | OROMUCOSAL | Status: DC | PRN
  Filled 2011-09-25: qty 177

## 2011-09-25 MED ORDER — HYDROMORPHONE HCL PF 1 MG/ML IJ SOLN
0.2500 mg | INTRAMUSCULAR | Status: DC | PRN
Start: 2011-09-25 — End: 2011-09-25
  Administered 2011-09-25 (×3): 0.5 mg via INTRAVENOUS

## 2011-09-25 MED ORDER — ALPRAZOLAM 0.5 MG PO TABS
1.0000 mg | ORAL_TABLET | Freq: Three times a day (TID) | ORAL | Status: DC | PRN
  Administered 2011-09-25: 1 mg via ORAL
  Filled 2011-09-25: qty 2

## 2011-09-25 MED ORDER — CEFAZOLIN SODIUM 1-5 GM-% IV SOLN
1.0000 g | Freq: Three times a day (TID) | INTRAVENOUS | Status: AC
  Administered 2011-09-25 (×2): 1 g via INTRAVENOUS
  Filled 2011-09-25 (×2): qty 50

## 2011-09-25 MED ORDER — ROCURONIUM BROMIDE 100 MG/10ML IV SOLN
INTRAVENOUS | Status: DC | PRN
  Administered 2011-09-25: 50 mg via INTRAVENOUS

## 2011-09-25 MED ORDER — PHENYLEPHRINE HCL 10 MG/ML IJ SOLN
10.0000 mg | INTRAVENOUS | Status: DC | PRN
  Administered 2011-09-25: 10 ug/min via INTRAVENOUS

## 2011-09-25 MED ORDER — DOCUSATE SODIUM 100 MG PO CAPS
100.0000 mg | ORAL_CAPSULE | Freq: Two times a day (BID) | ORAL | Status: DC
  Administered 2011-09-25 – 2011-09-26 (×2): 100 mg via ORAL
  Filled 2011-09-25 (×3): qty 1

## 2011-09-25 MED ORDER — PROMETHAZINE HCL 25 MG/ML IJ SOLN: 6.2500 mg | INTRAMUSCULAR | Status: DC | PRN

## 2011-09-25 MED ORDER — HETASTARCH-ELECTROLYTES 6 % IV SOLN
INTRAVENOUS | Status: DC | PRN
  Administered 2011-09-25: 08:00:00 via INTRAVENOUS

## 2011-09-25 MED ORDER — ACETAMINOPHEN 10 MG/ML IV SOLN
INTRAVENOUS | Status: DC | PRN
  Administered 2011-09-25: 1000 mg via INTRAVENOUS

## 2011-09-25 MED ORDER — MORPHINE SULFATE 4 MG/ML IJ SOLN
1.0000 mg | INTRAMUSCULAR | Status: DC | PRN
  Administered 2011-09-25: 2 mg via INTRAVENOUS
  Administered 2011-09-26: 4 mg via INTRAVENOUS
  Filled 2011-09-25 (×2): qty 1

## 2011-09-25 MED ORDER — NEOSTIGMINE METHYLSULFATE 1 MG/ML IJ SOLN
INTRAMUSCULAR | Status: DC | PRN
  Administered 2011-09-25: 4 mg via INTRAVENOUS

## 2011-09-25 MED ORDER — SODIUM CHLORIDE 0.9 % IJ SOLN: 3.0000 mL | INTRAMUSCULAR | Status: DC | PRN

## 2011-09-25 MED ORDER — DEXAMETHASONE 4 MG PO TABS
4.0000 mg | ORAL_TABLET | Freq: Four times a day (QID) | ORAL | Status: DC
  Administered 2011-09-25: 4 mg via ORAL
  Filled 2011-09-25 (×7): qty 1

## 2011-09-25 SURGICAL SUPPLY — 62 items
ADH SKN CLS APL DERMABOND .7 (GAUZE/BANDAGES/DRESSINGS) ×1
BLADE SURG ROTATE 9660 (MISCELLANEOUS) ×1 IMPLANT
BUR EGG ELITE 4.0 (BURR) IMPLANT
BUR MATCHSTICK NEURO 3.0 LAGG (BURR) IMPLANT
CANISTER SUCTION 2500CC (MISCELLANEOUS) ×1 IMPLANT
CLOSURE STERI STRIP 1/2 X4 (GAUZE/BANDAGES/DRESSINGS) ×1 IMPLANT
CLOTH BEACON ORANGE TIMEOUT ST (SAFETY) ×2 IMPLANT
CORDS BIPOLAR (ELECTRODE) ×2 IMPLANT
COVER SURGICAL LIGHT HANDLE (MISCELLANEOUS) ×3 IMPLANT
CRADLE DONUT ADULT HEAD (MISCELLANEOUS) ×2 IMPLANT
DERMABOND ADVANCED (GAUZE/BANDAGES/DRESSINGS) ×1
DERMABOND ADVANCED .7 DNX12 (GAUZE/BANDAGES/DRESSINGS) ×1 IMPLANT
DEVICE ENDSKLTN MED 6 7MM (Orthopedic Implant) IMPLANT
DRAPE C-ARM 42X72 X-RAY (DRAPES) ×2 IMPLANT
DRAPE POUCH INSTRU U-SHP 10X18 (DRAPES) ×2 IMPLANT
DRAPE SURG 17X23 STRL (DRAPES) ×2 IMPLANT
DRAPE U-SHAPE 47X51 STRL (DRAPES) ×2 IMPLANT
DRSG MEPILEX BORDER 4X4 (GAUZE/BANDAGES/DRESSINGS) ×1 IMPLANT
DURAPREP 26ML APPLICATOR (WOUND CARE) ×2 IMPLANT
ELECT COATED BLADE 2.86 ST (ELECTRODE) ×2 IMPLANT
ELECT REM PT RETURN 9FT ADLT (ELECTROSURGICAL) ×2
ELECTRODE REM PT RTRN 9FT ADLT (ELECTROSURGICAL) ×1 IMPLANT
ENDOSKELETON MED 6 7MM (Orthopedic Implant) ×2 IMPLANT
GLOVE BIOGEL PI IND STRL 6.5 (GLOVE) ×1 IMPLANT
GLOVE BIOGEL PI IND STRL 8.5 (GLOVE) ×1 IMPLANT
GLOVE BIOGEL PI INDICATOR 6.5 (GLOVE) ×2
GLOVE BIOGEL PI INDICATOR 8.5 (GLOVE) ×1
GLOVE ECLIPSE 6.0 STRL STRAW (GLOVE) ×2 IMPLANT
GLOVE ECLIPSE 8.5 STRL (GLOVE) ×2 IMPLANT
GLOVE SS BIOGEL STRL SZ 6.5 (GLOVE) IMPLANT
GLOVE SUPERSENSE BIOGEL SZ 6.5 (GLOVE) ×2
GOWN PREVENTION PLUS XXLARGE (GOWN DISPOSABLE) ×3 IMPLANT
GOWN STRL NON-REIN LRG LVL3 (GOWN DISPOSABLE) ×4 IMPLANT
KIT BASIN OR (CUSTOM PROCEDURE TRAY) ×2 IMPLANT
KIT ROOM TURNOVER OR (KITS) ×2 IMPLANT
NDL SPNL 18GX3.5 QUINCKE PK (NEEDLE) ×1 IMPLANT
NEEDLE SPNL 18GX3.5 QUINCKE PK (NEEDLE) ×2 IMPLANT
NS IRRIG 1000ML POUR BTL (IV SOLUTION) ×2 IMPLANT
PACK ORTHO CERVICAL (CUSTOM PROCEDURE TRAY) ×2 IMPLANT
PACK UNIVERSAL I (CUSTOM PROCEDURE TRAY) ×2 IMPLANT
PAD ARMBOARD 7.5X6 YLW CONV (MISCELLANEOUS) ×4 IMPLANT
PIN RETAINER PRODISC 14 MM (PIN) ×2 IMPLANT
PLATE LEVEL 1 TI VECTRA 14MM (Plate) ×1 IMPLANT
PUTTY BONE DBX 2.5 MIS (Bone Implant) ×1 IMPLANT
SCREW 4.0X14MM (Screw) ×4 IMPLANT
SCREW 4.0X16MM (Screw) ×2 IMPLANT
SCREW BN 14X4XSLF DRL VA SLF (Screw) IMPLANT
SPONGE INTESTINAL PEANUT (DISPOSABLE) ×1 IMPLANT
SPONGE SURGIFOAM ABS GEL 100 (HEMOSTASIS) ×2 IMPLANT
STRIP CLOSURE SKIN 1/2X4 (GAUZE/BANDAGES/DRESSINGS) ×1 IMPLANT
SURGIFLO TRUKIT (HEMOSTASIS) IMPLANT
SUT MNCRL AB 3-0 PS2 18 (SUTURE) ×2 IMPLANT
SUT SILK 2 0 (SUTURE) ×2
SUT SILK 2-0 18XBRD TIE 12 (SUTURE) ×1 IMPLANT
SUT VIC AB 2-0 CT1 18 (SUTURE) ×2 IMPLANT
SYR BULB IRRIGATION 50ML (SYRINGE) ×2 IMPLANT
SYR CONTROL 10ML LL (SYRINGE) ×1 IMPLANT
TAPE CLOTH 4X10 WHT NS (GAUZE/BANDAGES/DRESSINGS) ×2 IMPLANT
TAPE UMBILICAL COTTON 1/8X30 (MISCELLANEOUS) ×2 IMPLANT
TOWEL OR 17X24 6PK STRL BLUE (TOWEL DISPOSABLE) ×2 IMPLANT
TOWEL OR 17X26 10 PK STRL BLUE (TOWEL DISPOSABLE) ×2 IMPLANT
WATER STERILE IRR 1000ML POUR (IV SOLUTION) ×2 IMPLANT

## 2011-09-25 NOTE — Op Note (Signed)
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  Instrumentation system used was tightened titanium 7 mm medium lordotic cage packed with DBX mix, with the Synthes Vectra anterior cervical plate 14 mm in length.  SURGEON:  Venita Lick, MD  PHYSICIAN ASSISTANT: None   ANESTHESIA:   General  EBL: Minimal ml   BRIEF HISTORY: Wayne Rhodes is a 55 y.o. male who resented to my office with long-standing significant neck and arm pain. Despite protracted conservative management consisting consisting of activity modification, medications, injections, and physical therapy he continued to have significant debilitating neck and arm pain. As a result we elected to proceed with the aforementioned surgical procedure. All appropriate risks benefits and alternatives were discussed with the patient prior to the procedure he consented.  PROCEDURE DETAILS: Patient was brought into the operating room. After successful induction of general anesthesia and tracheal intubation a Time Out was done. This confirmed all pertinent important data. A roll of towels were placed between the shoulder blades the shoulders were taped down for visualization and the anterior cervical spine was prepped and draped in a standard fashion.  X-ray was brought into the field sterilely to identify the incision site. Incision site was marked off and then infiltrated with quarter percent Marcaine for cc.  Transverse incision was made centered over the C5-6 disc space. Sharp dissection was carried out down to and through the platysma. I then identified the sternomastoid and bluntly dissected and along the medial border of it. This allowed me to sweep the esophagus and trachea medially  with the finger in identify and protect the carotid sheath laterally with the finger was no suture the deep cervical and prevertebral fascia was palpated the anterior cervical spine. There is a significant osteophyte over the C5-6 disc space I placed a needle at this level took an x-ray and confirm that this was the appropriate level.  At this point I used a double-action Leksell rongeur to remove the large osteophyte from the anterior surface of  the disc space. I then used bipolar cautery to mobilize the longus coli muscles from the mid body of C5 to the mid body of C6. Then placed a tracking blades underneath the longus coli muscles deflated the endotracheal cuff and expanded the retractor to the appropriate width. The endotracheal cuff was then reinflated. Distraction pins were placed into the body of C5 and C6 and distracted the disc space.  An annulotomy was performed a 15 blade scalpel and then using a combination of pituitary rongeurs curettes and Kerrison rongeurs I removed all the disc space 1 and 2 mm Kerrison punches were used to remove the anterior overhanging osteophyte from the inferior aspect of the C5 vertebral body. I then used a micro-nerve hook and micro-curettes to resect the posterior annulus as well as develop a plane behind the posterior longitudinal ligament. Once I developed a plane between the posterior longitudinal ligament and the thecal sac I used a 1 mm Kerrison punch to remove the posterior longitudinal ligament. At this point I elected not to under the osteophytic ridge on that left side and released and removed. This point I could now taken nerve hook could pass a behind the vertebral vertebral joint without any significant problems.  Satisfied with the decompression I rasp  the endplates until I had bleeding subchondral bone and then irrigated the wound copiously normal saline. I then trialed with a interbody spacers and elected to use a 7 mm medium lordotic cage. This was  packed with DBX mix and malleted to the appropriate depth. I then removed the distraction pins and retractors and switched handheld retractors. I then contoured an anterior cervical plate secured with self drilling screws. 16 mm length screws were used to the body of C5 and 14 mm screws were used the body of C6.  There final x-rays were taken to hardware and graft were in good position.  I irrigated and normal saline obtain hemostasis using bipolar electrocautery and then return the trachea esophagus to the midline. The platysmas was reapproximated with interrupted 2-0 Vicryl sutures and the skin was closed with 3-0 Monocryl. Steri-Strips dry dressing an Aspen collar applied. The case all needle and sponge counts were correct there was no adverse intraoperative events. Patient was transferred hemodynamically stable to the PACU.  Venita Lick, MD 09/25/2011 9:48 AM

## 2011-09-25 NOTE — Anesthesia Procedure Notes (Addendum)
Procedure Name: Intubation Date/Time: 09/25/2011 7:41 AM Performed by: Margaree Mackintosh Pre-anesthesia Checklist: Patient identified, Emergency Drugs available, Suction available, Patient being monitored and Timeout performed Patient Re-evaluated:Patient Re-evaluated prior to inductionOxygen Delivery Method: Circle System Utilized Preoxygenation: Pre-oxygenation with 100% oxygen Intubation Type: IV induction Ventilation: Mask ventilation without difficulty and Oral airway inserted - appropriate to patient size   Date/Time: 09/25/2011 7:45 AM Performed by: Margaree Mackintosh Laryngoscope Size: Mac and 3 Grade View: Grade II Tube type: Oral Tube size: 8.0 mm Number of attempts: 1 Airway Equipment and Method: stylet Placement Confirmation: ETT inserted through vocal cords under direct vision,  breath sounds checked- equal and bilateral and positive ETCO2 Secured at: 21 cm Tube secured with: Tape Dental Injury: Teeth and Oropharynx as per pre-operative assessment

## 2011-09-25 NOTE — Progress Notes (Signed)
LUNCH RELIEF BY Devota Pace   RN. PT ON HOLD FOR 5000 BED

## 2011-09-25 NOTE — Anesthesia Preprocedure Evaluation (Addendum)
Anesthesia Evaluation  Patient identified by MRN, date of birth, ID band Patient awake    Reviewed: Allergy & Precautions, H&P , NPO status , Patient's Chart, lab work & pertinent test results, reviewed documented beta blocker date and time   Airway Mallampati: II TM Distance: >3 FB Neck ROM: full    Dental No notable dental hx. (+) Teeth Intact   Pulmonary neg pulmonary ROS,  clear to auscultation  Pulmonary exam normal       Cardiovascular hypertension, On Medications regular Normal    Neuro/Psych Negative Neurological ROS  Negative Psych ROS   GI/Hepatic Neg liver ROS, Controlled,  Endo/Other  Negative Endocrine ROS  Renal/GU negative Renal ROS  Genitourinary negative   Musculoskeletal   Abdominal   Peds  Hematology negative hematology ROS (+)   Anesthesia Other Findings   Reproductive/Obstetrics negative OB ROS                          Anesthesia Physical Anesthesia Plan  ASA: II  Anesthesia Plan: General   Post-op Pain Management:    Induction: Intravenous  Airway Management Planned: Oral ETT  Additional Equipment:   Intra-op Plan:   Post-operative Plan:   Informed Consent: I have reviewed the patients History and Physical, chart, labs and discussed the procedure including the risks, benefits and alternatives for the proposed anesthesia with the patient or authorized representative who has indicated his/her understanding and acceptance.     Plan Discussed with: CRNA  Anesthesia Plan Comments:         Anesthesia Quick Evaluation

## 2011-09-25 NOTE — Preoperative (Signed)
Beta Blockers   Reason not to administer Beta Blockers:Not Applicable. No home beta blockers 

## 2011-09-25 NOTE — Anesthesia Postprocedure Evaluation (Signed)
  Anesthesia Post-op Note  Patient: Wayne Rhodes  Procedure(s) Performed:  ANTERIOR CERVICAL DECOMPRESSION/DISCECTOMY FUSION 1 LEVEL - ACDF Cervical 5-6  Patient Location: PACU  Anesthesia Type: General  Level of Consciousness: sedated  Airway and Oxygen Therapy: Patient Spontanous Breathing and Patient connected to nasal cannula oxygen  Post-op Pain: none  Post-op Assessment: Post-op Vital signs reviewed, Patient's Cardiovascular Status Stable, Respiratory Function Stable and Patent Airway  Post-op Vital Signs: Reviewed and stable  Complications: No apparent anesthesia complications

## 2011-09-25 NOTE — Progress Notes (Signed)
CARE OF PT ASSUMED BY MASHAVER  RN 

## 2011-09-25 NOTE — Progress Notes (Signed)
Valuables locked up in security office in envelope number [id number] and locker number [phone]. Key and yellow copy of form hole punched and placed in front of chart.

## 2011-09-25 NOTE — H&P (Signed)
Patient re-examined No change in history or exam Radicular arm pain Plan: ACDF C5/6

## 2011-09-25 NOTE — Transfer of Care (Signed)
Immediate Anesthesia Transfer of Care Note  Patient: Wayne Rhodes  Procedure(s) Performed:  ANTERIOR CERVICAL DECOMPRESSION/DISCECTOMY FUSION 1 LEVEL - ACDF Cervical 5-6  Patient Location: PACU  Anesthesia Type: General  Level of Consciousness: sedated  Airway & Oxygen Therapy: Patient connected to nasal cannula oxygen  Post-op Assessment: Report given to PACU RN and Post -op Vital signs reviewed and stable  Post vital signs: stable  Complications: No apparent anesthesia complications

## 2011-09-26 ENCOUNTER — Encounter (HOSPITAL_COMMUNITY): Payer: Self-pay | Admitting: Orthopedic Surgery

## 2011-09-26 MED ORDER — OXYCODONE HCL 5 MG PO TABS: 10.0000 mg | ORAL_TABLET | ORAL | Status: DC | PRN

## 2011-09-26 MED ORDER — POLYETHYLENE GLYCOL 3350 17 G PO PACK: 17.0000 g | PACK | Freq: Every day | ORAL | Status: DC

## 2011-09-26 MED ORDER — METHOCARBAMOL 500 MG PO TABS: 500.0000 mg | ORAL_TABLET | Freq: Three times a day (TID) | ORAL | Status: DC

## 2011-09-26 MED ORDER — ONDANSETRON HCL 4 MG PO TABS: 4.0000 mg | ORAL_TABLET | Freq: Three times a day (TID) | ORAL | Status: DC | PRN

## 2011-09-26 NOTE — Plan of Care (Signed)
Problem: Discharge Progression Outcomes Goal: Steri-Strips applied Outcome: Progressing steris in place.

## 2011-09-26 NOTE — Discharge Summary (Signed)
Patient ID: Wayne Rhodes MRN: [id number] DOB/AGE: July 02, 1956 55 y.o.  Admit date: 09/25/2011 Discharge date: 09/26/2011  Admission Diagnoses:  Active Problems: Cervical spondylotic radiculopathy   Discharge Diagnoses:  Same  Past Medical History  Diagnosis Date  . Hyperlipidemia   . Hypertension   . Pneumonia     had pneumonia 4-5 months ago  . Recurrent upper respiratory infection (URI)     recent chest cold - treated with mucinex and cough medicine - now improved  . GERD (gastroesophageal reflux disease)   . Anxiety     takes xanax for anxiety    Surgeries: Procedure(s): ANTERIOR CERVICAL DECOMPRESSION/DISCECTOMY FUSION 1 LEVEL on 09/25/2011   Consultants:    Discharged Condition: Improved  Hospital Course: Wayne Rhodes is an 55 y.o. male who was admitted 09/25/2011 for operative treatment of cervical spondylotic radiculopathy. Patient has severe unremitting pain that affects sleep, daily activities, and work/hobbies. After pre-op clearance the patient was taken to the operating room on 09/25/2011 and underwent  Procedure(s): ANTERIOR CERVICAL DECOMPRESSION/DISCECTOMY FUSION 1 LEVEL.    Patient was given perioperative antibiotics: Anti-infectives     Start     Dose/Rate Route Frequency Ordered Stop   09/25/11 1530   ceFAZolin (ANCEF) IVPB 1 g/50 mL premix        1 g 100 mL/hr over 30 Minutes Intravenous 3 times per day 09/25/11 1155 09/25/11 2340   09/25/11 0600   ceFAZolin (ANCEF) IVPB 2 g/50 mL premix        2 g 100 mL/hr over 30 Minutes Intravenous 60 min pre-op 09/24/11 1425 09/25/11 0745           Patient was given sequential compression devices and early ambulation to prevent DVT.  Patient benefited maximally from hospital stay and there were no complications.    Recent vital signs: Patient Vitals for the past 24 hrs:  BP Temp Temp src Pulse Resp SpO2 Height Weight  09/26/11 0700 118/76 mmHg 97.6 F (36.4 C) Oral 76  18  94 % - -  09/25/11  2143 136/83 mmHg 98 F (36.7 C) Oral 95  18  94 % - -  09/25/11 2100 - - - - - - 6\' 3"  (1.905 m) 117.5 kg (259 lb 0.7 oz)  09/25/11 1400 148/79 mmHg 97.8 F (36.6 C) - 106  18  - - -  09/25/11 1345 148/79 mmHg 97.8 F (36.6 C) - 106  16  94 % - -  09/25/11 1232 - - - 99  6  94 % - -  09/25/11 1231 124/72 mmHg - - - - - - -  09/25/11 1205 - 98.8 F (37.1 C) - 96  13  92 % - -  09/25/11 1201 133/76 mmHg - - - - - - -  09/25/11 1150 - - - 100  12  96 % - -  09/25/11 1146 139/70 mmHg - - - - - - -  09/25/11 1135 - - - 95  13  94 % - -  09/25/11 1131 128/66 mmHg - - - - - - -  09/25/11 1130 - - - - 15  - - -  09/25/11 1120 - - - 93  12  94 % - -  09/25/11 1116 129/70 mmHg - - - - - - -  09/25/11 1105 - - - 88  14  92 % - -  09/25/11 1101 126/70 mmHg - - - - - - -  09/25/11 1050 - - -  91  10  94 % - -  09/25/11 1046 123/82 mmHg - - - - - - -  09/25/11 1036 - - - 92  3  94 % - -  09/25/11 1035 - - - 95  12  95 % - -  09/25/11 1031 125/75 mmHg - - - - - - -  09/25/11 1020 - - - 99  19  96 % - -  09/25/11 1010 - - - 97  8  95 % - -  09/25/11 1005 - - - 100  11  95 % - -  09/25/11 1001 109/86 mmHg - - - - - - -  09/25/11 1000 - 98 F (36.7 C) - - - - - -     Recent laboratory studies: No results found for this basename: WBC:2,HGB:2,HCT:2,PLT:2,NA:2,K:2,CL:2,CO2:2,BUN:2,CREATININE:2,GLUCOSE:2,PT:2,INR:2,CALCIUM,2: in the last 72 hours   Discharge Medications:  Current Discharge Medication List    START taking these medications   Details  methocarbamol (ROBAXIN) 500 MG tablet Take 1 tablet (500 mg total) by mouth 3 (three) times daily. Qty: 90 tablet, Refills: 0    ondansetron (ZOFRAN) 4 MG tablet Take 1 tablet (4 mg total) by mouth every 8 (eight) hours as needed for nausea. Qty: 20 tablet, Refills: 0    oxyCODONE (OXY IR/ROXICODONE) 5 MG immediate release tablet Take 2 tablets (10 mg total) by mouth every 4 (four) hours as needed for pain. Qty: 120 tablet, Refills: 0      polyethylene glycol (MIRALAX) packet Take 17 g by mouth daily. Qty: 14 each, Refills: 0      CONTINUE these medications which have NOT CHANGED   Details  ALPRAZolam (XANAX) 1 MG tablet Take 1 tablet (1 mg total) by mouth 3 (three) times daily as needed for sleep or anxiety. Qty: 90 tablet, Refills: 0    fenofibrate 160 MG tablet Take 160 mg by mouth daily.      losartan-hydrochlorothiazide (HYZAAR) 50-12.5 MG per tablet Take 1 tablet by mouth daily. Qty: 30 tablet, Refills: 1    sildenafil (VIAGRA) 100 MG tablet Take 100 mg by mouth daily as needed.        STOP taking these medications     clonazePAM (KLONOPIN) 0.5 MG tablet         Diagnostic Studies: Dg Chest 2 View  09/17/2011  *RADIOLOGY REPORT*  Clinical Data: Preoperative respiratory films.  CHEST - 2 VIEW  Comparison: Single view of the chest 06/16/2009.  Findings: Lungs are clear.  Heart size is normal.  No pneumothorax or pleural effusion.  IMPRESSION: No acute disease.  Original Report Authenticated By: Bernadene Bell. D'ALESSIO, M.D.   Dg Cervical Spine 2-3 Views  09/25/2011  *RADIOLOGY REPORT*  Clinical Data: Postop cervical spine  CERVICAL SPINE - 2-3 VIEW  Comparison: Earlier today at 9:39  Findings: C5-C6 fusion is again noted with normal AP and lateral alignment and no peri hardware lucency.  The disc space is maintained.  The prevertebral soft tissue stripe is within normal limits.  IMPRESSION: Stable appearance of the C5-C6 fusion with normal alignment and no evidence of hardware complication.  Original Report Authenticated By: Brandon Melnick, M.D.   Dg Cervical Spine 2-3 Views  09/25/2011  *RADIOLOGY REPORT*  Clinical Data: Status post ACDF at C5-C6  CERVICAL SPINE - 2-3 VIEW  Comparison: September 17, 2011  Findings: There is anterior fusion of C5-C6 now with normal lateral alignment and preservation of the disc space.  There is no peri hardware lucency.  IMPRESSION: C5-C6 fusion with no evidence of hardware  complication and normal alignment.  Original Report Authenticated By: Brandon Melnick, M.D.   Dg Cervical Spine 2-3 Views  09/17/2011  *RADIOLOGY REPORT*  Clinical Data: Preoperative films.  CERVICAL SPINE - 2-3 VIEW  Comparison: MRI cervical spine 05/11/2011.  Findings: Vertebral body height and alignment are maintained. There is loss of disc space height and endplate spurring at C5-6. Prevertebral soft tissues appear normal.  IMPRESSION: No acute finding.  Degenerative disc disease C5-6.  Original Report Authenticated By: Bernadene Bell. Wayne Rhodes, M.D.    Disposition: Home or Self Care  Discharge Orders    Future Appointments: Provider: Department: Dept Phone: Center:   10/15/2011 1:30 PM Elberta Fortis Bur[facility] [phone] [facility]   12/31/2011 10:15 AM Bruce W [facility] [phone] [facility]     Future Orders Please Complete By Expires   Diet - low sodium heart healthy      Call MD / Call 911      Comments:   If you experience chest pain or shortness of breath, CALL 911 and be transported to the hospital emergency room.  If you develope a fever above 101 F, pus (white drainage) or increased drainage or redness at the wound, or calf pain, call your surgeon's office.   Constipation Prevention      Comments:   Drink plenty of fluids.  Prune juice may be helpful.  You may use a stool softener, such as Colace (over the counter) 100 mg twice a day.  Use MiraLax (over the counter) for constipation as needed.   Increase activity slowly as tolerated      Weight Bearing as taught in Physical Therapy      Comments:   Use a walker or crutches as instructed.   Discharge wound care:      Comments:   Keep incision clean and dry.  Change your bandage as instructed by your health care providers.  If your bandage has been discontinued, keep your incision clean and dry.  May shower on Tuesday, pat dry after bathing.  DO NOT put lotion, ointment or powder on your incision.       Follow-up Information    Follow up with Michaelanthony Kempton D in 2 weeks.   Contact information:   [facility] [address], [address] [place] [address] [phone]         Discharge Plan:  DC to home on 09/26/11 Disposition:  STABLE AT TIME OF DC  Signed: Gwinda Maine 09/26/2011, 8:28 AM  Agree with above xrays satisfactory Ok for D/C today

## 2011-09-26 NOTE — Progress Notes (Signed)
Subjective: Procedure(s) (LRB): ANTERIOR CERVICAL DECOMPRESSION/DISCECTOMY FUSION 1 LEVEL (N/A) 1 Day Post-Op  Patient reports pain as mild.  Reports none arm pain reports incisional neck pain   Positive void Negative bowel movement Positive flatus Negative chest pain or shortness of breath  Objective: Vital signs in last 24 hours: Temp:  [97.6 F (36.4 C)-98.8 F (37.1 C)] 97.6 F (36.4 C) (12/14 0700) Pulse Rate:  [76-106] 76  (12/14 0700) Resp:  [3-19] 18  (12/14 0700) BP: (109-148)/(66-86) 118/76 mmHg (12/14 0700) SpO2:  [92 %-96 %] 94 % (12/14 0700) Weight:  [117.5 kg (259 lb 0.7 oz)] 259 lb 0.7 oz (117.5 kg) (12/13 2100)  Intake/Output from previous day: 12/13 0701 - 12/14 0700 In: 1910 [P.O.:160; I.V.:1250; IV Piggyback:500] Out: 0   No results found for this basename: WBC:2,RBC:2,HCT:2,PLT:2 in the last 72 hours No results found for this basename: NA:2,K:2,CL:2,CO2:2,BUN:2,CREATININE:2,GLUCOSE:2,CALCIUM:2 in the last 72 hours No results found for this basename: LABPT:2,INR:2 in the last 72 hours  ABD soft Neurovascular intact Sensation intact distally Intact pulses distally Incision: dressing C/D/I  Assessment/Plan: Patient reports episodes of vomiting last night with administration of morphine, no episodes since medication stopped, currently patient is stable  Mobilization with physical therapy Continue care  Discharge today after cleared by therapy  Gwinda Maine 09/26/2011, 8:03 AM

## 2011-09-26 NOTE — Progress Notes (Signed)
09/26/2011      OT order received and OT spoke with patient. Pt educated on cervical precautions and pt verbalized precautions back to OT. Pt fully dressed and expresses no further OT needs. Ot to sign off   Lucile Shutters   OTR/L Pager: [phone] Office: [phone] .

## 2011-09-26 NOTE — Progress Notes (Signed)
Order received today for SW to see patient; d/c'd home prior to SW seeing patient. No SW needs have been identified thru chart review.  Darylene Price, BSW, 09/26/2011 11:10 AM

## 2011-09-30 ENCOUNTER — Emergency Department (HOSPITAL_COMMUNITY): Payer: 59

## 2011-09-30 ENCOUNTER — Encounter (HOSPITAL_COMMUNITY): Payer: Self-pay | Admitting: Emergency Medicine

## 2011-09-30 ENCOUNTER — Emergency Department (HOSPITAL_COMMUNITY)
Admission: EM | Admit: 2011-09-30 | Discharge: 2011-10-01 | Disposition: A | Discharge status: Home / Self Care | Payer: 59 | Attending: Emergency Medicine | Admitting: Emergency Medicine

## 2011-09-30 DIAGNOSIS — Z981 Arthrodesis status: Secondary | ICD-10-CM | POA: Insufficient documentation

## 2011-09-30 DIAGNOSIS — G8918 Other acute postprocedural pain: Secondary | ICD-10-CM

## 2011-09-30 DIAGNOSIS — M542 Cervicalgia: Secondary | ICD-10-CM | POA: Insufficient documentation

## 2011-09-30 DIAGNOSIS — R0602 Shortness of breath: Secondary | ICD-10-CM | POA: Insufficient documentation

## 2011-09-30 LAB — DIFFERENTIAL
Basophils Absolute: 0.1 10*3/uL (ref 0.0–0.1)
Basophils Relative: 1 % (ref 0–1)
Monocytes Relative: 11 % (ref 3–12)
Neutro Abs: 5.3 10*3/uL (ref 1.7–7.7)
Neutrophils Relative %: 65 % (ref 43–77)

## 2011-09-30 LAB — CBC
Hemoglobin: 17 g/dL (ref 13.0–17.0)
MCHC: 35.3 g/dL (ref 30.0–36.0)
WBC: 8.2 10*3/uL (ref 4.0–10.5)

## 2011-09-30 LAB — POCT I-STAT, CHEM 8
Chloride: 105 mEq/L (ref 96–112)
Glucose, Bld: 119 mg/dL — ABNORMAL HIGH (ref 70–99)
HCT: 52 % (ref 39.0–52.0)
Potassium: 3.6 mEq/L (ref 3.5–5.1)

## 2011-09-30 MED ORDER — OXYCODONE-ACETAMINOPHEN 5-325 MG PO TABS
2.0000 | ORAL_TABLET | Freq: Once | ORAL | Status: AC
  Administered 2011-09-30: 2 via ORAL
  Filled 2011-09-30: qty 2

## 2011-09-30 MED ORDER — IOHEXOL 300 MG/ML  SOLN
100.0000 mL | Freq: Once | INTRAMUSCULAR | Status: AC | PRN
  Administered 2011-09-30: 100 mL via INTRAVENOUS

## 2011-09-30 MED ORDER — SODIUM CHLORIDE 0.9 % IV BOLUS (SEPSIS)
500.0000 mL | Freq: Once | INTRAVENOUS | Status: AC
  Administered 2011-09-30: 500 mL via INTRAVENOUS

## 2011-09-30 MED ORDER — SODIUM CHLORIDE 0.9 % IV SOLN: INTRAVENOUS | Status: DC

## 2011-09-30 MED ORDER — HYDROMORPHONE HCL PF 1 MG/ML IJ SOLN
1.0000 mg | Freq: Once | INTRAMUSCULAR | Status: DC
  Filled 2011-09-30: qty 1

## 2011-09-30 MED ORDER — OXYCODONE-ACETAMINOPHEN 10-325 MG PO TABS: 1.0000 | ORAL_TABLET | ORAL | Status: AC | PRN

## 2011-09-30 NOTE — ED Notes (Signed)
Patient returned from CT scan and Xray

## 2011-09-30 NOTE — ED Notes (Signed)
MD at bedside. 

## 2011-09-30 NOTE — ED Notes (Signed)
Pt had surgery on the anterior side of his neck to have discs repaired on Thursday and is now having pain and is having trouble swallowing. No trouble breathing except for when he bends over, he states. Heart burn as well.

## 2011-09-30 NOTE — ED Notes (Signed)
Pt states he has heartburn symptoms.

## 2011-10-01 MED ORDER — GI COCKTAIL ~~LOC~~
30.0000 mL | Freq: Once | ORAL | Status: AC
  Administered 2011-10-01: 30 mL via ORAL
  Filled 2011-10-01: qty 30

## 2011-10-01 NOTE — ED Provider Notes (Signed)
History     CSN: [id number] Arrival date & time: 09/30/2011  6:06 PM   First MD Initiated Contact with Patient 09/30/11 2206      Chief Complaint  Patient presents with  . Neck Pain    (Consider location/radiation/quality/duration/timing/severity/associated sxs/prior treatment) Patient is a 55 y.o. male presenting with neck pain. The history is provided by the patient.  Neck Pain  This is a new problem. Pertinent negatives include no chest pain, no numbness, no headaches and no weakness.   patient anterior neck surgery for disc repair on Thursday last week by Dr. Shon Baton. He started having increased pain and some difficulty breathing. Says it hurts to swallow. He has trouble breathing he bends over. he's also had some heartburn. He states the burning is in his throat. No fevers. No swelling. No drainage. He states he can followup [facility].  Past Medical History  Diagnosis Date  . Hyperlipidemia   . Hypertension   . Pneumonia     had pneumonia 4-5 months ago  . Recurrent upper respiratory infection (URI)     recent chest cold - treated with mucinex and cough medicine - now improved  . GERD (gastroesophageal reflux disease)   . Anxiety     takes xanax for anxiety    Past Surgical History  Procedure Date  . Knee arthroscopy     left  . Elbow surgery     both elbow surgery  . Anterior cervical decomp/discectomy fusion 09/25/2011    Procedure: ANTERIOR CERVICAL DECOMPRESSION/DISCECTOMY FUSION 1 LEVEL;  Surgeon: Alvy Beal;  Location: [facility] OR;  Service: Orthopedics;  Laterality: N/A;  ACDF Cervical 5-6    Family History  Problem Relation Age of Onset  . Cancer Father     prostate    History  Substance Use Topics  . Smoking status: Never Smoker   . Smokeless tobacco: Not on file  . Alcohol Use: 0.6 oz/week    1 Cans of beer per week     occasional beer      Review of Systems  Constitutional: Negative for activity change and appetite change.  HENT:  Positive for neck pain. Negative for neck stiffness.   Eyes: Negative for pain.  Respiratory: Positive for shortness of breath. Negative for chest tightness.   Cardiovascular: Negative for chest pain and leg swelling.  Gastrointestinal: Negative for nausea, vomiting, abdominal pain and diarrhea.  Genitourinary: Negative for flank pain.  Musculoskeletal: Negative for back pain.  Skin: Negative for rash.  Neurological: Negative for weakness, numbness and headaches.  Psychiatric/Behavioral: Negative for behavioral problems.    Allergies  Clonazepam; Quinapril; and Tylenol  Home Medications   Current Outpatient Rx  Name Route Sig Dispense Refill  . ALPRAZOLAM 1 MG PO TABS Oral Take 1 tablet (1 mg total) by mouth 3 (three) times daily as needed for sleep or anxiety. 90 tablet 0  . FENOFIBRATE 160 MG PO TABS Oral Take 160 mg by mouth daily.      Marland Kitchen LOSARTAN POTASSIUM-HCTZ 50-12.5 MG PO TABS Oral Take 1 tablet by mouth daily. 30 tablet 1  . METHOCARBAMOL 500 MG PO TABS Oral Take 500 mg by mouth 3 (three) times daily as needed. For pain.     . OXYCODONE HCL 5 MG PO TABS Oral Take 30-40 mg by mouth every 4 (four) hours as needed. For pain.     . OXYCODONE-ACETAMINOPHEN 10-325 MG PO TABS Oral Take 1 tablet by mouth every 4 (four) hours as needed for pain.  20 tablet 0    BP 132/91  Pulse 86  Temp(Src) 98.3 F (36.8 C) (Oral)  Resp 15  SpO2 97%  Physical Exam  Nursing note and vitals reviewed. Constitutional: He is oriented to person, place, and time. He appears well-developed and well-nourished.  HENT:  Head: Normocephalic and atraumatic.  Eyes: EOM are normal. Pupils are equal, round, and reactive to light.  Neck: Normal range of motion. Neck supple.       Horizontal postsurgical scar over anterior lower neck to the left side. Mild swelling area. No erythema. No stridor.  Cardiovascular: Normal rate, regular rhythm and normal heart sounds.   No murmur heard. Pulmonary/Chest: Effort  normal and breath sounds normal.  Abdominal: Soft. Bowel sounds are normal. He exhibits no distension and no mass. There is no tenderness. There is no rebound and no guarding.  Musculoskeletal: Normal range of motion. He exhibits no edema.  Neurological: He is alert and oriented to person, place, and time. No cranial nerve deficit.  Skin: Skin is warm and dry.  Psychiatric: He has a normal mood and affect.    ED Course  Procedures (including critical care time)  Labs Reviewed  POCT I-STAT, CHEM 8 - Abnormal; Notable for the following:    Glucose, Bld 119 (*)    Hemoglobin 17.7 (*)    All other components within normal limits  CBC  DIFFERENTIAL  I-STAT, CHEM 8   Dg Chest 2 View  09/30/2011  *RADIOLOGY REPORT*  Clinical Data: Cough  CHEST - 2 VIEW  Comparison: 09/17/2011  Findings: Lungs are clear. No pleural effusion or pneumothorax. The cardiomediastinal contours are within normal limits. The visualized bones and soft tissues are without significant appreciable abnormality.  Cervical fusion hardware partially imaged.  IMPRESSION: No acute cardiopulmonary process.  Original Report Authenticated By: Waneta Martins, M.D.   Ct Soft Tissue Neck W Contrast  09/30/2011  *RADIOLOGY REPORT*  Clinical Data: Recent surgery, increasing neck pain.  CT NECK WITH CONTRAST  Technique:  Multidetector CT imaging of the neck was performed with intravenous contrast.  Contrast: OMNIPAQUE IOHEXOL 300 MG/ML IV SOLN  Comparison: 09/25/2011 radiograph, 05/11/2011 MRI.  Findings: Status post C5-6 anterior plate and screw fixation with interbody cage.  No CT evidence for hardware complication.  No acute fracture or dislocation. There is mild prevertebral swelling/fluid at the level of the hardware left paramidline (see image 99 series 6 for example), not necessarily unexpected in the setting of recent surgery.  No loculated fluid collection to suggest abscess.  The carotid and jugular vasculature are within  normal limits.  The left vertebral artery is diminutive.  Lung apices are clear. Limited intracranial images demonstrate no acute abnormality.  Parotid and submandibular glands are symmetric.  No lymphadenopathy.  Unremarkable thyroid gland.  No acute abnormality identified within the nasopharynx, nasal cavity, oropharynx, oral cavity, or hypopharynx.  IMPRESSION: Status post C5-6 anterior fusion. Normal alignment and no evidence for hardware complication.  There is a small amount of fluid within the prevertebral/retropharyngeal space. This is nonspecific however not necessarily unexpected given the recent surgery.  Original Report Authenticated By: Waneta Martins, M.D.     1. Post-operative pain       MDM  Postoperative pain to neck after disc surgery with anterior approach. CT was done which showed a small fluid collection. Likely normal post surgery fluid. No fever. White count is not elevated. He was given Percocet for pain. He states he is not allergic Tylenol just  gives him GI problems. He is discharged home for followup with Dr. Irean Hong R. Rubin Payor, MD 10/01/11 [phone]

## 2011-10-13 ENCOUNTER — Telehealth: Payer: Self-pay | Admitting: *Deleted

## 2011-10-13 NOTE — Telephone Encounter (Signed)
Pt states he saw Dr. Clent Ridges a few weeks ago at [facility].  His anxiety medication was changed to Xanax and he was given a short term rx.  The Xanax seems to work better and pt is requesting a refill to be sent to [facility].

## 2011-10-13 NOTE — Telephone Encounter (Signed)
Pt is out of xanax °

## 2011-10-13 NOTE — Telephone Encounter (Signed)
Pt needs follow up to discuss.  We have previously explained our lactulose to use alprazolam chronically secondary to abuse potential

## 2011-10-13 NOTE — Telephone Encounter (Signed)
Refill request for Alprazolam 1 mg take 1 po tid prn 

## 2011-10-15 ENCOUNTER — Ambulatory Visit: Payer: 59 | Admitting: Family Medicine

## 2011-10-15 NOTE — Telephone Encounter (Signed)
Pt is aware waiting on MD °

## 2011-10-15 NOTE — Telephone Encounter (Signed)
I have already answered his request as per documented noted. No clear indication for chronic alprazolam use. Patient did not show for appointment today

## 2011-10-15 NOTE — Telephone Encounter (Signed)
I cannot refill this. He is Dr. Lucie Leather patient

## 2011-10-22 ENCOUNTER — Other Ambulatory Visit: Payer: Self-pay | Admitting: *Deleted

## 2011-10-22 ENCOUNTER — Encounter: Payer: Self-pay | Admitting: Family Medicine

## 2011-10-22 ENCOUNTER — Ambulatory Visit (INDEPENDENT_AMBULATORY_CARE_PROVIDER_SITE_OTHER): Payer: 59 | Admitting: Family Medicine

## 2011-10-22 DIAGNOSIS — F419 Anxiety disorder, unspecified: Secondary | ICD-10-CM

## 2011-10-22 DIAGNOSIS — T783XXA Angioneurotic edema, initial encounter: Secondary | ICD-10-CM

## 2011-10-22 DIAGNOSIS — I10 Essential (primary) hypertension: Secondary | ICD-10-CM

## 2011-10-22 DIAGNOSIS — F411 Generalized anxiety disorder: Secondary | ICD-10-CM

## 2011-10-22 MED ORDER — ALPRAZOLAM 1 MG PO TABS: 1.0000 mg | ORAL_TABLET | Freq: Three times a day (TID) | ORAL | Status: DC | PRN

## 2011-10-22 MED ORDER — LOSARTAN POTASSIUM-HCTZ 50-12.5 MG PO TABS: 1.0000 | ORAL_TABLET | Freq: Every day | ORAL | Status: DC

## 2011-10-22 NOTE — Patient Instructions (Signed)
Get back on once daily Sertraline and follow up in one month to reassess.

## 2011-10-22 NOTE — Progress Notes (Signed)
  Subjective:    Patient ID: Wayne Rhodes, male    DOB: Mar 25, 1956, 56 y.o.   MRN: [id number]  HPI  Medical followup. [facility] possible angioedema reaction. Had been on quinapril for blood pressure for several years. Felt this was possibly related to ACE inhibitor and medication changed to losartan HCTZ. No further recurrence of angioedema since then. He also had been on clonazepam for anxiety and not clear whether reaction could of been this versus ACE inhibitor. He states has taken alprazolam for years for anxiety which tends to come on mostly at night unpredictably sometimes waking from sleep. He has episodes of shortness of breath and extreme anxiety and alprazolam seems to be working. He states he had only 30 tablets filled December 1 but notation was that he had 90. He is recovering from recent cervical neck surgery.  Denies specific stressors. Denies depression. No regular alcohol use.   Review of Systems  Constitutional: Negative for fever and chills.  Respiratory: Negative for cough, shortness of breath and wheezing.   Cardiovascular: Negative for chest pain, palpitations and leg swelling.  Musculoskeletal: Positive for back pain.  Psychiatric/Behavioral: Negative for dysphoric mood. The patient is nervous/anxious.        Objective:   Physical Exam  Constitutional: He appears well-developed and well-nourished.  HENT:  Mouth/Throat: Oropharynx is clear and moist.  Neck: Neck supple. No thyromegaly present.  Cardiovascular: Normal rate and regular rhythm.   Pulmonary/Chest: Effort normal and breath sounds normal. No respiratory distress. He has no wheezes. He has no rales.  Musculoskeletal: He exhibits no edema.          Assessment & Plan:  #1 hypertension stable with recent change of medications above. Refill losartan HCTZ for one year #2 possible recent angioedema reaction with ACE inhibitor. Follow closely on angiotensin  receptor blocker #3 history of chronic anxiety and possible panic attack. Patient states he never took sertraline. Get back on sertraline 50 mg daily. Limited alprazolam 1 mg #90 with no refill to use sparingly until reassessed in one month on sertraline

## 2011-10-23 ENCOUNTER — Other Ambulatory Visit: Payer: Self-pay | Admitting: Family Medicine

## 2011-11-10 ENCOUNTER — Other Ambulatory Visit: Payer: Self-pay | Admitting: Family Medicine

## 2011-11-11 ENCOUNTER — Encounter: Payer: Self-pay | Admitting: Family

## 2011-11-11 ENCOUNTER — Telehealth: Payer: Self-pay | Admitting: *Deleted

## 2011-11-11 ENCOUNTER — Ambulatory Visit (INDEPENDENT_AMBULATORY_CARE_PROVIDER_SITE_OTHER): Payer: 59 | Admitting: Family

## 2011-11-11 VITALS — BP 144/100 | HR 88 | Temp 98.7°F | Resp 16 | Ht 76.0 in | Wt 260.0 lb

## 2011-11-11 DIAGNOSIS — F341 Dysthymic disorder: Secondary | ICD-10-CM

## 2011-11-11 DIAGNOSIS — T7840XA Allergy, unspecified, initial encounter: Secondary | ICD-10-CM

## 2011-11-11 DIAGNOSIS — I10 Essential (primary) hypertension: Secondary | ICD-10-CM

## 2011-11-11 DIAGNOSIS — Z888 Allergy status to other drugs, medicaments and biological substances status: Secondary | ICD-10-CM

## 2011-11-11 DIAGNOSIS — R51 Headache: Secondary | ICD-10-CM

## 2011-11-11 DIAGNOSIS — F329 Major depressive disorder, single episode, unspecified: Secondary | ICD-10-CM

## 2011-11-11 MED ORDER — TRAMADOL HCL 50 MG PO TABS: 50.0000 mg | ORAL_TABLET | Freq: Three times a day (TID) | ORAL | Status: AC | PRN

## 2011-11-11 MED ORDER — BUPROPION HCL ER (XL) 150 MG PO TB24: 150.0000 mg | ORAL_TABLET | Freq: Every day | ORAL | Status: DC

## 2011-11-11 MED ORDER — AMLODIPINE BESYLATE 5 MG PO TABS: 5.0000 mg | ORAL_TABLET | Freq: Every day | ORAL | Status: DC

## 2011-11-11 NOTE — Telephone Encounter (Signed)
Appt scheduled for swollen face with [facility].

## 2011-11-11 NOTE — Progress Notes (Signed)
Subjective:    Patient ID: Wayne Rhodes, male    DOB: 1956/02/25, 56 y.o.   MRN: [id number]  HPI 56 year old white male, patient of Dr. Caryl Never is in today for recheck of anxiety and depression. Patient believes that he has had facial swelling related to the Zoloft. Taken Zoloft for 3 weeks and Xanax when necessary. He reports not knowing whether the Zoloft is actually helping the symptoms, but believes that the Xanax is working well. He is out of Xanax today. Patient denies any feelings of helplessness or hopelessness, no thoughts of death or dying.  Patient has a history of hypertension, is currently taking losartan and believes he is tolerating it well. Patient denies any lightheadedness, dizziness, chest pain, palpitations, shortness of breath, or edema.  Patient complains of a headache that's been present for one day. Describes the headache as throbbing across the front of his for head. A recipient of 3/10. The intensity comes and goes. Has been taking Goody's with no relief. Denies any blurred vision or double vision.   Review of Systems  Constitutional: Negative.   HENT: Positive for facial swelling. Negative for drooling, trouble swallowing and voice change.   Eyes: Negative.   Respiratory: Negative.  Negative for apnea, cough, chest tightness and shortness of breath.   Cardiovascular: Negative.  Negative for chest pain.  Musculoskeletal: Negative.   Neurological: Negative.   Hematological: Negative.   Psychiatric/Behavioral: Negative.        Past Medical History  Diagnosis Date  . Hyperlipidemia   . Hypertension   . Pneumonia     had pneumonia 4-5 months ago  . Recurrent upper respiratory infection (URI)     recent chest cold - treated with mucinex and cough medicine - now improved  . GERD (gastroesophageal reflux disease)   . Anxiety     takes xanax for anxiety    History   Social History  . Marital Status: Single    Spouse Name: N/A    Number of Children: N/A   . Years of Education: N/A   Occupational History  . Not on file.   Social History Main Topics  . Smoking status: Never Smoker   . Smokeless tobacco: Not on file  . Alcohol Use: 0.6 oz/week    1 Cans of beer per week     occasional beer  . Drug Use: No  . Sexually Active: Yes   Other Topics Concern  . Not on file   Social History Narrative  . No narrative on file    Past Surgical History  Procedure Date  . Knee arthroscopy     left  . Elbow surgery     both elbow surgery  . Anterior cervical decomp/discectomy fusion 09/25/2011    Procedure: ANTERIOR CERVICAL DECOMPRESSION/DISCECTOMY FUSION 1 LEVEL;  Surgeon: Alvy Beal;  Location: [facility] OR;  Service: Orthopedics;  Laterality: N/A;  ACDF Cervical 5-6    Family History  Problem Relation Age of Onset  . Cancer Father     prostate    Allergies  Allergen Reactions  . Clonazepam     Angioedema   . Quinapril     Angioedema   . Tylenol (Acetaminophen)     GI problems  . Zoloft Swelling    Current Outpatient Prescriptions on File Prior to Visit  Medication Sig Dispense Refill  . ALPRAZolam (XANAX) 1 MG tablet Take 1 tablet (1 mg total) by mouth 3 (three) times daily as needed for sleep or anxiety.  90  tablet  0  . fenofibrate 160 MG tablet TAKE 1 TABLET EVERY DAY  30 tablet  11  . methocarbamol (ROBAXIN) 500 MG tablet Take 500 mg by mouth 3 (three) times daily as needed. For pain.         BP 144/100  Pulse 88  Temp 98.7 F (37.1 C)  Resp 16  Ht 6\' 4"  (1.93 m)  Wt 260 lb (117.935 kg)  BMI 31.65 kg/m2chart Objective:   Physical Exam  Constitutional: He is oriented to person, place, and time. He appears well-developed and well-nourished.  HENT:  Right Ear: External ear normal.  Left Ear: External ear normal.  Nose: Nose normal.  Mouth/Throat: Oropharynx is clear and moist.       Facial swelling noted primarily to the right side, mild. Mild lip swelling. No drooling or signs of respiratory distress.  Voice is normal.  Neck: Normal range of motion. Neck supple.  Cardiovascular: Normal rate and normal heart sounds.   Pulmonary/Chest: Effort normal and breath sounds normal.  Musculoskeletal: Normal range of motion.  Neurological: He is alert and oriented to person, place, and time.  Skin: Skin is warm and dry.  Psychiatric: He has a normal mood and affect.       Appears distant          Assessment & Plan:  Assessment: Angioedema, anxiety and depression, hypertension, headache  Plan: After review of patient's records, patient was switched off the quinapril in December for angioedema. He was started on losartan and within the last 3 weeks he's developed angioedema. Ironically, this is about the same time he started taking the Zoloft. Patient believes the angioedema is related to the Zoloft. The Zoloft is not helping her symptoms at this point, therefore we have decided to discontinue the Zoloft and discontinue losartan for fear that it may be the culprit of the angioedema. We'll start Norvasc 5 mg once daily. Wellbutrin 150 mg XL once daily. Will bring patient back for recheck with Dr. Caryl Never, his primary care physician in 3-4 weeks and sooner when necessary. Tramadol when necessary headache pain.

## 2011-11-11 NOTE — Patient Instructions (Addendum)
1. Benadryl as needed. Stop Zoloft.   Allergic Reaction, Mild to Moderate Allergies may happen from anything your body is sensitive to. This may be food, medications, pollens, chemicals, and nearly anything around you in everyday life that produces allergens. An allergen is anything that causes an allergy producing substance. Allergens cause your body to release allergic antibodies. Through a chain of events, they cause a release of histamine into the blood stream. Histamines are meant to protect you, but they also cause your discomfort. This is why antihistamines are often used for allergies. Heredity is often a factor in causing allergic reactions. This means you may have some of the same allergies as your parents. Allergies happen in all age groups. You may have some idea of what caused your reaction. There are many allergens around [place]. It may be difficult to know what caused your reaction. If this is a first time event, it may never happen again. Allergies cannot be cured but can be controlled with medications. SYMPTOMS  You may get some or all of the following problems from allergies.  Swelling and itching in and around the mouth.   Tearing, itchy eyes.   Nasal congestion and runny nose.   Sneezing and coughing.   An itchy red rash or hives.   Vomiting or diarrhea.   Difficulty breathing.  Seasonal allergies occur in all age groups. They are seasonal because they usually occur during the same season every year. They may be a reaction to molds, grass pollens, or tree pollens. Other causes of allergies are house dust mite allergens, pet dander and mold spores. These are just a common few of the thousands of allergens around [place]. All of the symptoms listed above happen when you come in contact with pollens and other allergens. Seasonal allergies are usually not life threatening. They are generally more of a nuisance that can often be handled using medications. Hay fever is a combination of all  or some of the above listed allergy problems. It may often be treated with simple over-the-counter medications such as diphenhydramine. Take medication as directed. Check with your caregiver or package insert for child dosages. TREATMENT AND HOME CARE INSTRUCTIONS If hives or rash are present:  Take medications as directed.   You may use an over-the-counter antihistamine (diphenhydramine) for hives and itching as needed. Do not drive or drink alcohol until medications used to treat the reaction have worn off. Antihistamines tend to make people sleepy.   Apply cold cloths (compresses) to the skin or take baths in cool water. This will help itching. Avoid hot baths or showers. Heat will make a rash and itching worse.   If your allergies persist and become more severe, and over the counter medications are not effective, there are many new medications your caretaker can prescribe. Immunotherapy or desensitizing injections can be used if all else fails. Follow up with your caregiver if problems continue.  SEEK MEDICAL CARE IF:   Your allergies are becoming progressively more troublesome.   You suspect a food allergy. Symptoms generally happen within 30 minutes of eating a food.   Your symptoms have not gone away within 2 days or are getting worse.   You develop new symptoms.   You want to retest yourself or your child with a food or drink you think causes an allergic reaction. Never test yourself or your child of a suspected allergy without being under the watchful eye of your caregivers. A second exposure to an allergen may be life-threatening.  SEEK IMMEDIATE MEDICAL CARE IF:  You develop difficulty breathing or wheezing, or have a tight feeling in your chest or throat.   You develop a swollen mouth, hives, swelling, or itching all over your body.  A severe reaction with any of the above problems should be considered life-threatening. If you suddenly develop difficulty breathing call for  local emergency medical help. THIS IS AN EMERGENCY. MAKE SURE YOU:   Understand these instructions.   Will watch your condition.   Will get help right away if you are not doing well or get worse.  Document Released: 07/27/2007 Document Revised: 06/11/2011 Document Reviewed: 07/27/2007 [facility] Patient Information 2012 [place], [place].   Anxiety and Panic Attacks Your caregiver has informed you that you are having an anxiety or panic attack. There may be many forms of this. Most of the time these attacks come suddenly and without warning. They come at any time of day, including periods of sleep, and at any time of life. They may be strong and unexplained. Although panic attacks are very scary, they are physically harmless. Sometimes the cause of your anxiety is not known. Anxiety is a protective mechanism of the body in its fight or flight mechanism. Most of these perceived danger situations are actually nonphysical situations (such as anxiety over losing a job). CAUSES  The causes of an anxiety or panic attack are many. Panic attacks may occur in otherwise healthy people given a certain set of circumstances. There may be a genetic cause for panic attacks. Some medications may also have anxiety as a side effect. SYMPTOMS  Some of the most common feelings are:  Intense terror.   Dizziness, feeling faint.   Hot and cold flashes.   Fear of going crazy.   Feelings that nothing is real.   Sweating.   Shaking.   Chest pain or a fast heartbeat (palpitations).   Smothering, choking sensations.   Feelings of impending doom and that death is near.   Tingling of extremities, this may be from over-breathing.   Altered reality (derealization).   Being detached from yourself (depersonalization).  Several symptoms can be present to make up anxiety or panic attacks. DIAGNOSIS  The evaluation by your caregiver will depend on the type of symptoms you are experiencing. The diagnosis of  anxiety or panic attack is made when no physical illness can be determined to be a cause of the symptoms. TREATMENT  Treatment to prevent anxiety and panic attacks may include:  Avoidance of circumstances that cause anxiety.   Reassurance and relaxation.   Regular exercise.   Relaxation therapies, such as yoga.   Psychotherapy with a psychiatrist or therapist.   Avoidance of caffeine, alcohol and illegal drugs.   Prescribed medication.  SEEK IMMEDIATE MEDICAL CARE IF:   You experience panic attack symptoms that are different than your usual symptoms.   You have any worsening or concerning symptoms.  Document Released: 09/29/2005 Document Revised: 06/11/2011 Document Reviewed: 01/31/2010 [facility] Patient Information 2012 [place], [place].

## 2011-11-18 ENCOUNTER — Ambulatory Visit (INDEPENDENT_AMBULATORY_CARE_PROVIDER_SITE_OTHER): Payer: 59 | Admitting: Family Medicine

## 2011-11-18 DIAGNOSIS — F411 Generalized anxiety disorder: Secondary | ICD-10-CM

## 2011-11-18 DIAGNOSIS — F419 Anxiety disorder, unspecified: Secondary | ICD-10-CM

## 2011-11-18 DIAGNOSIS — T783XXA Angioneurotic edema, initial encounter: Secondary | ICD-10-CM

## 2011-11-18 DIAGNOSIS — I10 Essential (primary) hypertension: Secondary | ICD-10-CM

## 2011-11-18 MED ORDER — EPINEPHRINE 0.3 MG/0.3ML IJ DEVI: 0.3000 mg | Freq: Once | INTRAMUSCULAR | Status: DC

## 2011-11-18 MED ORDER — PREDNISONE 10 MG PO TABS: ORAL_TABLET | ORAL | Status: DC

## 2011-11-18 NOTE — Progress Notes (Signed)
Subjective:    Patient ID: Wayne Rhodes, male    DOB: 05-May-1956, 56 y.o.   MRN: [id number]  HPI  Patient seen for repeat evaluation regarding some recent recurrent angioedema the lips and hypertension. History is that he been treated for several years with quinapril and presented to Saturday clinic with angioedema but no tongue edema or airway compromise. quinapril discontinued.  He was changed to losartan HCTZ and initially seemed to be doing well. Presented here last week with elevated blood pressure and recurrent edema mostly lower lip. Again, no dyspnea or tongue edema.  Patient had recently been placed on sertraline for some chronic anxiety issues. He was not seeing improvement after 3 weeks. Sertraline was discontinued and he was started on Wellbutrin XL 150 mg but never started. He does not take any aspirin or other nonsteroidals. Blood pressure medication was changed from losartan to amlodipine 5 mg. He has not had any hives. Possibly some mild feet swelling. Only other medication is fenofibrate 160 mg. Denies over-the-counter supplements. No history of food allergies. No history of allergy testing.  Past Medical History  Diagnosis Date  . Hyperlipidemia   . Hypertension   . Pneumonia     had pneumonia 4-5 months ago  . Recurrent upper respiratory infection (URI)     recent chest cold - treated with mucinex and cough medicine - now improved  . GERD (gastroesophageal reflux disease)   . Anxiety     takes xanax for anxiety   Past Surgical History  Procedure Date  . Knee arthroscopy     left  . Elbow surgery     both elbow surgery  . Anterior cervical decomp/discectomy fusion 09/25/2011    Procedure: ANTERIOR CERVICAL DECOMPRESSION/DISCECTOMY FUSION 1 LEVEL;  Surgeon: Alvy Beal;  Location: [facility] OR;  Service: Orthopedics;  Laterality: N/A;  ACDF Cervical 5-6    reports that he has never smoked. He does not have any smokeless tobacco history on file. He reports that he drinks  about .6 ounces of alcohol per week. He reports that he does not use illicit drugs. family history includes Cancer in his father. Allergies  Allergen Reactions  . Clonazepam     Angioedema   . Quinapril     Angioedema   . Tylenol (Acetaminophen)     GI problems  . Zoloft Swelling      Review of Systems  Constitutional: Negative for fever, chills, appetite change and unexpected weight change.  HENT: Negative for congestion, sore throat, trouble swallowing and voice change.   Respiratory: Negative for cough, shortness of breath, wheezing and stridor.   Cardiovascular: Negative for chest pain, palpitations and leg swelling.  Skin: Negative for rash.  Neurological: Negative for headaches.       Objective:   Physical Exam  Constitutional: He is oriented to person, place, and time. He appears well-developed and well-nourished.  HENT:  Mouth/Throat: Oropharynx is clear and moist.       Patient has mild edema of lower lip. No evidence for tongue edema. No evidence for edema posterior pharynx  Neck: Neck supple.  Cardiovascular: Normal rate and regular rhythm.   Pulmonary/Chest: Effort normal and breath sounds normal. No respiratory distress. He has no wheezes. He has no rales.  Musculoskeletal: He exhibits no edema.  Lymphadenopathy:    He has no cervical adenopathy.  Neurological: He is alert and oriented to person, place, and time.  Skin: No rash noted.  Psychiatric: He has a normal mood and affect.  His behavior is normal.          Assessment & Plan:  #1 recurrent angioedema mostly lower lip. Unclear etiology. Recent discontinuation of ACE inhibitor and angiotensin receptor blocker. We've recommended continuing Benadryl at night and Allegra or Zyrtec during the day. Prednisone taper. EpiPen prescription given. Referral to allergist to rule out any allergic component.   He knows to present immediately for medical help if he has any shortness of breath or tongue swelling #2  hypertension improved. Continue amlodipine 5 mg daily #3 chronic anxiety. Stable on alprazolam 1 mg 3 times a day. Discontinue Wellbutrin with no clear indication (no depression hx).

## 2011-11-18 NOTE — Patient Instructions (Addendum)
Take over the counter antihistamine such as Allegra or Zyrtec for lip edema. We will call you regarding appointment with allergist. Follow up immediately if you have any swelling of tongue or throat or difficulty breathing.  Angioedema Angioedema (AE) is a sudden swelling of the eyelids, lips, lobes of ears, external genitalia, skin, and other parts of the body. AE can happen by itself. It usually begins during the night and is found on awakening. It can happen with hives and other allergic reactions. Attacks can be mild and annoying, or life-threatening if the air passages swell. AE generally occurs in a short time period (over minutes to hours) and gets better in 24 to 48 hours. It usually does not cause any serious problems.  There are 2 different kinds of AE:   Allergic AE.   Nonallergic AE.   There may be an overreaction or direct stimulation of cells that are a part of the immune system (mast cells).   There may be problems with the release of chemicals made by the body that cause swelling and inflammation (kinins). AE due to kinins can be inherited from parents (hereditary), or it can develop on its own (acquired). Acquired AE either shows up before, or along with, certain diseases or is due to the body's immune system attacking parts of the body's own cells (autoimmune).  CAUSES  Allergic  AE due to allergic reactions are caused by something that causes the body to react (trigger). Common triggers include:   Foods.   Medicines.   Latex.   Direct contact with certain fruits, vegetables, or animal saliva.   Insect stings.  Nonallergic  Mast cell stimulation may be caused by:   Medicines.   Dyes used in X-rays.   The body's own immune system reactions to parts of the body (autoimmune disease).   Possibly, some virus infections.   AE due to problems with kinins can be hereditary or acquired. Attacks are triggered by:   Mild injury.   Dental work or any surgery.    Stress.   Sudden changes in temperature.   Exercise.   Medicines.   AE due to problems with kinins can also be due to certain medicines, especially blood pressure medicines like angiotensin-converting enzyme (ACE) inhibitors. African Americans are at nearly 5 times greater risk of developing AE than Caucasians from ACE inhibitors.  SYMPTOMS  Allergic symptoms:  Non-itchy swelling of the skin. Often the swelling is on the face and lips, but any area of the skin can swell. Sometimes, the swelling can be painful. If hives are present, there is intense itching.   Breathing problems if the air passages swell.  Nonallergic symptoms:  If internal organs are involved, there may be:   Nausea.   Abdominal pain.   Vomiting.   Difficulty swallowing.   Difficulty passing urine.   Breathing problems if the air passages swell.  Depending on the cause of AE, episodes may:  Only happen once (if triggers are removed or avoided).   Come back in unpredictable patterns.   Repeat for several years and then gradually fade away.  DIAGNOSIS  AE is diagnosed by:   Asking questions to find out how fast the symptoms began.   Taking a family history.   Physical exam.   Diagnostic tests. Tests could include:   Allergy skin tests to see if the problem is allergic.   Blood tests to diagnose hereditary and some acquired types of AE.   Other tests to see if there  is a hidden disease leading to the AE.  TREATMENT  Treatment depends on the type and cause (if any) of the AE. Allergic  Allergic types of AE are treated with:   Immediate removal of the trigger or medicine (if any).   Epinephrine injection.   Steroids.   Antihistamines.   Hospitalization for severe attacks.  Nonallergic  Mast cell stimulation types of AE are treated with:   Immediate removal of the trigger or medicine (if any).   Epinephrine injection.   Steroids.   Antihistamines.   Hospitalization for  severe attacks.   Hereditary AE is treated with:   Medicines to prevent and treat attacks. There is little response to antihistamines, epinephrine, or steroids.   Preventive medicines before dental work or surgery.   Removing or avoiding medicines that trigger attacks.   Hospitalization for severe attacks.   Acquired AE is treated with:   Treating underlying disease (if any).   Medicines to prevent and treat attacks.  HOME CARE INSTRUCTIONS   Always carry your emergency allergy treatment medicines with you.   Wear a medical bracelet.   Avoid known triggers.  SEEK MEDICAL CARE IF:   You get repeat attacks.   Your attacks are more frequent or more severe despite preventive measures.   You have hereditary AE and are considering having children. It is important to discuss the risks of passing this on to your children.  SEEK IMMEDIATE MEDICAL CARE IF:   You have difficulty breathing.   You have difficulty swallowing.   You experience fainting.  This condition should be treated immediately. It can be life-threatening if it involves throat swelling. Document Released: 12/08/2001 Document Revised: 06/11/2011 Document Reviewed: 09/28/2008 [facility] Patient Information 2012 [place], [place].

## 2011-11-20 ENCOUNTER — Telehealth: Payer: Self-pay | Admitting: Family Medicine

## 2011-11-20 MED ORDER — ALPRAZOLAM 1 MG PO TABS: 1.0000 mg | ORAL_TABLET | Freq: Three times a day (TID) | ORAL | Status: DC | PRN

## 2011-11-20 NOTE — Telephone Encounter (Signed)
Pt called req refill of ALPRAZolam (XANAX) 1 MG tablet to [facility]

## 2011-11-20 NOTE — Telephone Encounter (Signed)
Refill for 3 months. 

## 2011-12-02 ENCOUNTER — Ambulatory Visit: Payer: 59 | Admitting: Family Medicine

## 2011-12-02 ENCOUNTER — Telehealth: Payer: Self-pay | Admitting: Family Medicine

## 2011-12-02 NOTE — Telephone Encounter (Signed)
confidential Office Message [address] [place], [place] [address] p. [phone] f. [phone] To: Lacey Jensen Fax: [phone] From: [facility] Date/ Time: 12/01/2011 6:41 PM Taken By: Eduardo Osier, CSR Caller: [name] Facility: not collected Patient: Wayne, Rhodes DOB: Oct 20, 1955 Phone: [phone] Reason for Call: See info below Regarding Appointment: Yes Appt Date: 12/02/2011 Appt Time: 10:00:00 AM Provider: Evelena Peat Reason: Details: Pt saw Dr. Caryl Never last week and he says his appt for tomorrow was supposed to have been cancelled but it wasn't so he called back this evening to make sure that someon cancels his appt for tomorrow. Outcome: Instructed patient to call back on the next business day.

## 2011-12-14 ENCOUNTER — Observation Stay (HOSPITAL_COMMUNITY)
Admission: EM | Admit: 2011-12-14 | Discharge: 2011-12-14 | Disposition: A | Discharge status: Home / Self Care | Payer: 59 | Attending: Emergency Medicine | Admitting: Emergency Medicine

## 2011-12-14 ENCOUNTER — Encounter (HOSPITAL_COMMUNITY): Payer: Self-pay

## 2011-12-14 DIAGNOSIS — E785 Hyperlipidemia, unspecified: Secondary | ICD-10-CM | POA: Insufficient documentation

## 2011-12-14 DIAGNOSIS — T46905A Adverse effect of unspecified agents primarily affecting the cardiovascular system, initial encounter: Secondary | ICD-10-CM | POA: Insufficient documentation

## 2011-12-14 DIAGNOSIS — I10 Essential (primary) hypertension: Secondary | ICD-10-CM | POA: Insufficient documentation

## 2011-12-14 DIAGNOSIS — T7840XA Allergy, unspecified, initial encounter: Secondary | ICD-10-CM

## 2011-12-14 DIAGNOSIS — F411 Generalized anxiety disorder: Secondary | ICD-10-CM | POA: Insufficient documentation

## 2011-12-14 DIAGNOSIS — T783XXA Angioneurotic edema, initial encounter: Principal | ICD-10-CM | POA: Insufficient documentation

## 2011-12-14 DIAGNOSIS — Y92009 Unspecified place in unspecified non-institutional (private) residence as the place of occurrence of the external cause: Secondary | ICD-10-CM | POA: Insufficient documentation

## 2011-12-14 MED ORDER — METHYLPREDNISOLONE SODIUM SUCC 125 MG IJ SOLR
125.0000 mg | Freq: Once | INTRAMUSCULAR | Status: AC
  Administered 2011-12-14: 125 mg via INTRAVENOUS
  Filled 2011-12-14: qty 2

## 2011-12-14 MED ORDER — DIPHENHYDRAMINE HCL 50 MG/ML IJ SOLN: 12.5000 mg | Freq: Four times a day (QID) | INTRAMUSCULAR | Status: DC

## 2011-12-14 MED ORDER — FAMOTIDINE IN NACL 20-0.9 MG/50ML-% IV SOLN
20.0000 mg | Freq: Once | INTRAVENOUS | Status: AC
  Administered 2011-12-14: 20 mg via INTRAVENOUS
  Filled 2011-12-14: qty 50

## 2011-12-14 MED ORDER — SODIUM CHLORIDE 0.9 % IV SOLN
Freq: Once | INTRAVENOUS | Status: AC
  Administered 2011-12-14: 10 mL/h via INTRAVENOUS

## 2011-12-14 MED ORDER — DIPHENHYDRAMINE HCL 50 MG/ML IJ SOLN
25.0000 mg | Freq: Once | INTRAMUSCULAR | Status: AC
  Administered 2011-12-14: 25 mg via INTRAVENOUS
  Filled 2011-12-14: qty 1

## 2011-12-14 NOTE — Discharge Instructions (Signed)
Please look through all of your medications (or take them to your pharmacy) and dispose of the ones that have caused you reactions like the ones you had today.  Please do not take them ever again.  Please dispose of your Quinapril.  Follow up with your primary care provider.  You may return to the ER at any time for worsening condition or any new symptoms that concern you.

## 2011-12-14 NOTE — ED Provider Notes (Signed)
History     CSN: [id number]  Arrival date & time 12/14/11  0901   First MD Initiated Contact with Patient 12/14/11 [phone]      Chief Complaint  Patient presents with  . 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  States he is not sure what pills he took or when, but he did take two pills this morning that he thinks were also for blood pressure.  Also has one area of pruritic rash on his left anterior thigh.  Denies swelling in his throat, difficulty swallowing or breathing.    Patient is a 56 y.o. male presenting with allergic reaction. The history is provided by the patient.  Allergic Reaction The primary symptoms do not include wheezing or shortness of breath.    Past Medical History  Diagnosis Date  . Hyperlipidemia   . Hypertension   . Pneumonia     had pneumonia 4-5 months ago  . Recurrent upper respiratory infection (URI)     recent chest cold - treated with mucinex and cough medicine - now improved  . GERD (gastroesophageal reflux disease)   . Anxiety     takes xanax for anxiety    Past Surgical History  Procedure Date  . Knee arthroscopy     left  . Elbow surgery     both elbow surgery  . Anterior cervical decomp/discectomy fusion 09/25/2011    Procedure: ANTERIOR CERVICAL DECOMPRESSION/DISCECTOMY FUSION 1 LEVEL;  Surgeon: Alvy Beal;  Location: [facility] OR;  Service: Orthopedics;  Laterality: N/A;  ACDF Cervical 5-6    Family History  Problem Relation Age of Onset  . Cancer Father     prostate    History  Substance Use Topics  . Smoking status: Never Smoker   . Smokeless tobacco: Not on file  . Alcohol Use: 0.6 oz/week    1 Cans of beer per week     occasional beer      Review of Systems  HENT:  Negative for sore throat and trouble swallowing.   Respiratory: Negative for shortness of breath, wheezing and stridor.   Cardiovascular: Negative for chest pain.    Allergies  Clonazepam; Quinapril; Tylenol; and Zoloft  Home Medications   Current Outpatient Rx  Name Route Sig Dispense Refill  . ALPRAZOLAM 1 MG PO TABS Oral Take 1 mg by mouth 3 (three) times daily as needed. For anxiety and sleep    . AMLODIPINE BESYLATE 5 MG PO TABS Oral Take 5 mg by mouth daily.    . BUPROPION HCL ER (XL) 150 MG PO TB24 Oral Take 150 mg by mouth daily.    Marland Kitchen DIPHENHYDRAMINE HCL 25 MG PO TABS Oral Take 25 mg by mouth 2 (two) times daily as needed. For swelling    . EPINEPHRINE 0.3 MG/0.3ML IJ DEVI Intramuscular Inject 0.3 mg into the muscle as needed. For anaphylaxis    . FENOFIBRATE 160 MG PO TABS Oral Take 160 mg by mouth daily.    . OXYCODONE-ACETAMINOPHEN 5-325 MG PO TABS Oral Take 1 tablet by mouth every 4 (four) hours as needed. For pain    . ALPRAZOLAM 1 MG PO TABS Oral Take 1 tablet (1 mg total) by mouth 3 (three) times daily as needed for sleep. 30 tablet 0    BP 131/94  Pulse 101  Temp(Src) 97 F (36.1 C) (Oral)  Resp 18  SpO2 97%  Physical Exam  Nursing note and vitals reviewed. Constitutional: He is oriented to person, place, and time. He appears well-developed and well-nourished.  HENT:  Head: Normocephalic and atraumatic.    Mouth/Throat: Oropharynx is clear and moist. No oropharyngeal exudate.  Neck: Neck supple.  Cardiovascular: Normal rate and regular rhythm.   Pulmonary/Chest: Effort normal and breath sounds normal. No stridor. No respiratory distress. He has no wheezes. He has no rales.  Lymphadenopathy:    He has no cervical adenopathy.  Neurological: He is alert and oriented to person, place, and time.    ED Course  Procedures (including critical care time)  Labs Reviewed - No data to display No results found.  Discussed patient with Dr Preston Fleeting who agrees with  plan for protocol.   2:16 PM Patient reports he is feeling much better and is ready to be discharged home.  There is great improvement in the swelling of his lips and left eye.  Patient does not have any airway difficulties or shortness of breath.  1. Allergic reaction caused by a drug   2. 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  Patient states that he is not sure which of his medications.  He has taken and he has not thrown out.  The medications that have caused this reaction before.  Patient placed on CDU protocol for allergic reaction/angioedema, showed great improvement after first dose of medications and no worsening of symptoms.  Pt d/c home with instructions to dispose of his medications that cause this, pt advised to follow up with PCP and discussed medications with pharmacist.  Patient verbalizes understanding and agrees with plan.        Rise Patience, [place] 12/14/11 1420

## 2011-12-14 NOTE — ED Provider Notes (Signed)
Medical screening examination/treatment/procedure(s) were conducted as a shared visit with non-physician practitioner(s) and myself.  I personally evaluated the patient during the encounter   Dean Goldner, MD 12/14/11 1657 

## 2011-12-14 NOTE — ED Notes (Signed)
Pt c/o allergic reaction starting last night

## 2011-12-14 NOTE — ED Notes (Signed)
Pt brought a list of 2 meds w/him to ED, one of them Bupropion HCL XL 150 mg which the pt said was his BP medication, pharmacy tech called [facility] and was informed pt usually takes Norvasc which he has not filled since December, pt also reports he still has a bottle of Quinapril at his house and was dc'd from it d/t angioedema. This Clinical research associate encouraged pt to take all his bottles of prescriptions to [facility] and have the pharmacist go over them with him and discard what is not needed.

## 2011-12-14 NOTE — ED Notes (Signed)
Pt c/o allergic reaction, went to bed last night with bottom lip swelling, woke this am w/swelling to eyes and mouth, symptoms associated w/itching. Swelling noted to bilateral eyes and mouth, rr even and unlabored, airwat clear and patent, nad.

## 2011-12-14 NOTE — ED Provider Notes (Signed)
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 He arrived in the ED with findings of angioedema and has responded well to antihistamines and steroids. Swelling is down considerably. He has not vomited the pharynx. He is advised to get rid of all of the ACE inhibitor medications that are in his home so that is that accidentally take it again.  Dione Booze, MD 12/14/11 1130

## 2011-12-14 NOTE — ED Notes (Signed)
Questioned pt about his allergies and pt now sts that he's not allergic to anything because he had an allergy test last week that was negative. Pt does have documented angioedema from taking clonazepam and quinapril. Pt currently having swelling of lips and tongue and welts on his arms.

## 2011-12-14 NOTE — ED Notes (Signed)
Pt reports eating a Wayne Rhodes chicken sandwich and drank 3 beers last night, pt reports eating this before w/o any allergic reaction, pt is unsure of BP medication, pharmacy tech is calling [facility] now

## 2011-12-15 ENCOUNTER — Telehealth: Payer: Self-pay | Admitting: Family Medicine

## 2011-12-15 NOTE — Telephone Encounter (Signed)
[facility] Triage Call Report Triage Record Num: [id number] Operator: Baldomero Lamy Patient Name: Wayne Rhodes Call Date & Time: 12/14/2011 8:44:08AM Patient Phone: [phone] PCP: Evelena Peat Patient Gender: Male PCP Fax : [phone] Patient DOB: 09/12/1956 [facility] Name[facility] Reason for Call: Caller: [name]/Patient; PCP: Evelena Peat; CB#: [phone]; Call regarding Allergic Reaction to something. Eyes and lips swollen ; Pt calling regarding swelling of lips, mouth and eyes upon awakening this am. [facility] lot but they do not open until 1000. Emergent sxs of Sxs of anaphalaxis. Advised pt to call 911 to get him. Pt refused stating he was fine and already in the car. Warned pt that condition could worsen at any time. Pt still refused but is driving to ER now. Protocol(s) Used: Allergic Reaction, Severe Recommended Outcome per Protocol: Activate EMS 911 Reason for Outcome: Signs/symptoms of anaphylaxis Care Advice: ~ Place patient in position of comfort where breathing is easiest. ~ Do not give the patient anything to eat or drink. ~ An adult should stay with the patient, preferably one trained in CPR. ~ IMMEDIATE ACTION Write down provider's name. List or place the following in a bag for transport with the patient: current prescription and/or nonprescription medications; alternative treatments, therapies and medications; and street drugs. ~ If previously prescribed for these symptoms by provider for this person, administer dose of epinephrine (e.g. EpiPen) as directed. ~ 12/14/2011 8:50:59AM Page 1 of 1 [id number]

## 2011-12-19 ENCOUNTER — Ambulatory Visit (INDEPENDENT_AMBULATORY_CARE_PROVIDER_SITE_OTHER): Payer: 59 | Admitting: Family Medicine

## 2011-12-19 ENCOUNTER — Encounter: Payer: Self-pay | Admitting: Family Medicine

## 2011-12-19 VITALS — BP 130/82 | Temp 97.8°F | Wt 259.0 lb

## 2011-12-19 DIAGNOSIS — T783XXA Angioneurotic edema, initial encounter: Secondary | ICD-10-CM

## 2011-12-19 MED ORDER — PREDNISONE 10 MG PO TABS: ORAL_TABLET | ORAL | Status: DC

## 2011-12-19 NOTE — Patient Instructions (Signed)
Call for any recurrent facial swelling or any difficulty breathing.

## 2011-12-19 NOTE — Progress Notes (Signed)
  Subjective:    Patient ID: Wayne Rhodes, male    DOB: 09-27-1956, 56 y.o.   MRN: [id number]  HPI  Patient seen with some persistent facial swelling. Prior history of angioedema possibly related to ACE inhibitor. He mistakenly took what he thinks was another quinapril tablet Sunday night and woke up several hours later with swelling of the lips and eyes. No dyspnea and no wheezing. Went to emergency room. Given epinephrine and Benadryl. Discharged home and overall improved. Taking Benadryl occasionally at night. Still no difficulty breathing or swallowing. His only regular medications include amlodipine, fenofibrate, and alprazolam.  He's had previous allergy testing no clear food allergies.   Review of Systems  Constitutional: Negative for fever and chills.  HENT: Negative for sore throat, trouble swallowing and voice change.   Respiratory: Negative for cough, shortness of breath and wheezing.   Cardiovascular: Negative for chest pain and leg swelling.       Objective:   Physical Exam  Constitutional: He appears well-developed and well-nourished.  HENT:       Patient may have some very mild swelling of the upper and lower lip but no evidence for a tongue edema or posterior pharyngeal edema. No obvious facial edema otherwise. Overall he states this is greatly improved compared to 5 days ago.  Cardiovascular: Normal rate and regular rhythm.   Pulmonary/Chest: Effort normal and breath sounds normal. No respiratory distress. He has no wheezes. He has no rales.  Skin:       No urticaria          Assessment & Plan:  Recurrent angioedema type reaction possibly related to mistakenly taking another quinapril tablet. He is advised to get rid of these altogether to avoid confusion. Continue Benadryl at night. Patient has an EpiPen at home. Prednisone taper over one week

## 2011-12-22 NOTE — Progress Notes (Signed)
Observation review is complete. 

## 2011-12-31 ENCOUNTER — Ambulatory Visit: Payer: 59 | Admitting: Family Medicine

## 2012-01-01 ENCOUNTER — Ambulatory Visit (INDEPENDENT_AMBULATORY_CARE_PROVIDER_SITE_OTHER): Payer: 59 | Admitting: Family Medicine

## 2012-01-01 ENCOUNTER — Encounter: Payer: Self-pay | Admitting: Family Medicine

## 2012-01-01 VITALS — BP 144/90 | Temp 98.1°F | Wt 262.0 lb

## 2012-01-01 DIAGNOSIS — R600 Localized edema: Secondary | ICD-10-CM

## 2012-01-01 DIAGNOSIS — I10 Essential (primary) hypertension: Secondary | ICD-10-CM

## 2012-01-01 DIAGNOSIS — R609 Edema, unspecified: Secondary | ICD-10-CM

## 2012-01-01 MED ORDER — HYDROCHLOROTHIAZIDE 12.5 MG PO CAPS: 12.5000 mg | ORAL_CAPSULE | Freq: Every day | ORAL | Status: DC

## 2012-01-01 NOTE — Progress Notes (Signed)
  Subjective:    Patient ID: Wayne Rhodes, male    DOB: November 24, 1955, 56 y.o.   MRN: [id number]  HPI  Patient seen with bilateral foot and lower leg edema. Started Tuesday morning.  He sat up all night Monday playing cards for approximately 15 hours continuously. He takes amlodipine for hypertension. Possible angioedema related to ACE inhibitor previously. Has not had any recurrent angioedema symtpoms. Denies any dyspnea. No chest pain. No recent consumption of nonsteroidals. No dietary changes. No history of renal impairment.  Past Medical History  Diagnosis Date  . Hyperlipidemia   . Hypertension   . Pneumonia     had pneumonia 4-5 months ago  . Recurrent upper respiratory infection (URI)     recent chest cold - treated with mucinex and cough medicine - now improved  . GERD (gastroesophageal reflux disease)   . Anxiety     takes xanax for anxiety   Past Surgical History  Procedure Date  . Knee arthroscopy     left  . Elbow surgery     both elbow surgery  . Anterior cervical decomp/discectomy fusion 09/25/2011    Procedure: ANTERIOR CERVICAL DECOMPRESSION/DISCECTOMY FUSION 1 LEVEL;  Surgeon: Alvy Beal;  Location: [facility] OR;  Service: Orthopedics;  Laterality: N/A;  ACDF Cervical 5-6    reports that he has never smoked. He does not have any smokeless tobacco history on file. He reports that he drinks about .6 ounces of alcohol per week. He reports that he does not use illicit drugs. family history includes Cancer in his father. Allergies  Allergen Reactions  . Clonazepam     Angioedema   . Quinapril     Angioedema   . Tylenol (Acetaminophen)     GI problems  . Zoloft Swelling      Review of Systems  Constitutional: Negative for appetite change and unexpected weight change.  Respiratory: Negative for cough and shortness of breath.   Cardiovascular: Positive for leg swelling. Negative for chest pain and palpitations.  Gastrointestinal: Negative for abdominal pain.    Genitourinary: Negative for dysuria.  Neurological: Negative for dizziness and headaches.       Objective:   Physical Exam  Constitutional: He appears well-developed and well-nourished.  Cardiovascular: Normal rate and regular rhythm.   Pulmonary/Chest: Effort normal and breath sounds normal. No respiratory distress. He has no wheezes. He has no rales.  Musculoskeletal: He exhibits edema.       Patient has trace to 1+ pitting edema of feet and lower legs bilaterally . good capillary refill          Assessment & Plan:  Bilateral leg edema. Probably related to venous stasis and amlodipine with recent episode of dependency for several hours. Per patient this is improving. Continue to observe. If swelling not further improved next few days with walking and elevation consider low-dose HCTZ 12.5 mg briefly

## 2012-01-01 NOTE — Patient Instructions (Signed)
Consider HCTZ one daily if edema not improving over the next several days

## 2012-01-22 ENCOUNTER — Ambulatory Visit (INDEPENDENT_AMBULATORY_CARE_PROVIDER_SITE_OTHER): Payer: 59 | Admitting: Family Medicine

## 2012-01-22 ENCOUNTER — Encounter: Payer: Self-pay | Admitting: Family Medicine

## 2012-01-22 VITALS — BP 140/90 | HR 92 | Temp 97.9°F | Wt 263.0 lb

## 2012-01-22 DIAGNOSIS — R609 Edema, unspecified: Secondary | ICD-10-CM

## 2012-01-22 DIAGNOSIS — R600 Localized edema: Secondary | ICD-10-CM

## 2012-01-22 DIAGNOSIS — I10 Essential (primary) hypertension: Secondary | ICD-10-CM

## 2012-01-22 MED ORDER — DILTIAZEM HCL ER COATED BEADS 180 MG PO CP24: 180.0000 mg | ORAL_CAPSULE | Freq: Every day | ORAL | Status: DC

## 2012-01-22 NOTE — Progress Notes (Signed)
  Subjective:    Patient ID: Wayne Rhodes, male    DOB: 05-10-1956, 56 y.o.   MRN: [id number]  HPI  Bilateral leg edema. Worsen 2-3 days ago. No change in diet. No recent nonsteroidals. Denies any dyspnea. No orthopnea. He takes amlodipine for hypertension 5 mg daily. Was in the sunlight over the weekend but no sunburn. Denies any edema upper extremities. He is supposed to be taking HCTZ for hypertension as well but apparently not taking.  Previous questionable angioedema with ACE inhibitor  Past Medical History  Diagnosis Date  . Hyperlipidemia   . Hypertension   . Pneumonia     had pneumonia 4-5 months ago  . Recurrent upper respiratory infection (URI)     recent chest cold - treated with mucinex and cough medicine - now improved  . GERD (gastroesophageal reflux disease)   . Anxiety     takes xanax for anxiety   Past Surgical History  Procedure Date  . Knee arthroscopy     left  . Elbow surgery     both elbow surgery  . Anterior cervical decomp/discectomy fusion 09/25/2011    Procedure: ANTERIOR CERVICAL DECOMPRESSION/DISCECTOMY FUSION 1 LEVEL;  Surgeon: Alvy Beal;  Location: [facility] OR;  Service: Orthopedics;  Laterality: N/A;  ACDF Cervical 5-6    reports that he has never smoked. He does not have any smokeless tobacco history on file. He reports that he drinks about .6 ounces of alcohol per week. He reports that he does not use illicit drugs. family history includes Cancer in his father. Allergies  Allergen Reactions  . Clonazepam     Angioedema   . Quinapril     Angioedema   . Tylenol (Acetaminophen)     GI problems  . Zoloft Swelling      Review of Systems  Constitutional: Negative for fever and chills.  Eyes: Negative for visual disturbance.  Respiratory: Negative for cough, shortness of breath and wheezing.   Cardiovascular: Positive for leg swelling. Negative for chest pain and palpitations.  Neurological: Negative for dizziness and weakness.         Objective:   Physical Exam  Constitutional: He appears well-developed and well-nourished.  Neck: Neck supple. No thyromegaly present.  Cardiovascular: Normal rate and regular rhythm.   Pulmonary/Chest: Effort normal and breath sounds normal. No respiratory distress. He has no wheezes. He has no rales.  Musculoskeletal: He exhibits edema.       Trace pitting edema legs bilaterally          Assessment & Plan:  Bilateral leg edema. Suspect multifactorial and related to amlodipine as well as sunlight exposure over the weekend though no significant sunburn. Avoid nonsteroidals. Low sodium diet. Change amlodipine to Cardizem CD 180 mg. Start back HCTZ 12.5 mg daily. Reassess blood pressure one month and sooner as needed

## 2012-02-12 ENCOUNTER — Encounter: Payer: Self-pay | Admitting: Family Medicine

## 2012-02-12 ENCOUNTER — Ambulatory Visit (INDEPENDENT_AMBULATORY_CARE_PROVIDER_SITE_OTHER): Payer: 59 | Admitting: Family Medicine

## 2012-02-12 VITALS — BP 140/90 | Temp 97.9°F | Wt 267.0 lb

## 2012-02-12 DIAGNOSIS — I10 Essential (primary) hypertension: Secondary | ICD-10-CM

## 2012-02-12 DIAGNOSIS — R609 Edema, unspecified: Secondary | ICD-10-CM

## 2012-02-12 DIAGNOSIS — R600 Localized edema: Secondary | ICD-10-CM

## 2012-02-12 LAB — BASIC METABOLIC PANEL
BUN: 11 mg/dL (ref 6–23)
Calcium: 9.1 mg/dL (ref 8.4–10.5)
GFR: 70.13 mL/min (ref >60.00)
Potassium: 3.9 mEq/L (ref 3.5–5.1)
Sodium: 140 mEq/L (ref 135–145)

## 2012-02-12 LAB — POCT URINALYSIS DIPSTICK
Glucose, UA: NEGATIVE
Nitrite, UA: NEGATIVE
Protein, UA: NEGATIVE
Urobilinogen, UA: 0.2

## 2012-02-12 LAB — TSH: TSH: 2.4 u[IU]/mL (ref 0.35–5.50)

## 2012-02-12 MED ORDER — HYDROCHLOROTHIAZIDE 12.5 MG PO CAPS: 12.5000 mg | ORAL_CAPSULE | Freq: Every day | ORAL | Status: DC

## 2012-02-12 NOTE — Progress Notes (Signed)
  Subjective:    Patient ID: Wayne Rhodes, male    DOB: 1956/01/20, 56 y.o.   MRN: [id number]  HPI  Bilateral leg edema. Patient has hypertension and we switched off amlodipine last visit to diltiazem. Edema not improving and weight is up 4 pounds. He denies any orthopnea or dyspnea. No chest pain. No dietary changes. He has been on hydrochlorothiazide but apparently not taking. No recent nonsteroidal use. Currently only medications are alprazolam and diltiazem. No history of CHF.  Past Medical History  Diagnosis Date  . Hyperlipidemia   . Hypertension   . Pneumonia     had pneumonia 4-5 months ago  . Recurrent upper respiratory infection (URI)     recent chest cold - treated with mucinex and cough medicine - now improved  . GERD (gastroesophageal reflux disease)   . Anxiety     takes xanax for anxiety   Past Surgical History  Procedure Date  . Knee arthroscopy     left  . Elbow surgery     both elbow surgery  . Anterior cervical decomp/discectomy fusion 09/25/2011    Procedure: ANTERIOR CERVICAL DECOMPRESSION/DISCECTOMY FUSION 1 LEVEL;  Surgeon: Alvy Beal;  Location: [facility] OR;  Service: Orthopedics;  Laterality: N/A;  ACDF Cervical 5-6    reports that he has never smoked. He does not have any smokeless tobacco history on file. He reports that he drinks about .6 ounces of alcohol per week. He reports that he does not use illicit drugs. family history includes Cancer in his father. Allergies  Allergen Reactions  . Clonazepam     Angioedema   . Quinapril     Angioedema   . Tylenol (Acetaminophen)     GI problems  . Sertraline Hcl Swelling     Review of Systems  Constitutional: Negative for fever, chills, appetite change and unexpected weight change.  Respiratory: Negative for cough, shortness of breath and wheezing.   Cardiovascular: Positive for leg swelling. Negative for chest pain and palpitations.  Gastrointestinal: Negative for nausea, vomiting and blood in  stool.  Genitourinary: Negative for dysuria.  Skin: Negative for rash.  Neurological: Negative for dizziness.  Hematological: Negative for adenopathy.       Objective:   Physical Exam  Constitutional: He appears well-developed and well-nourished.  Neck: Neck supple. No thyromegaly present.  Cardiovascular: Normal rate and regular rhythm.   Pulmonary/Chest: Effort normal and breath sounds normal. No respiratory distress. He has no wheezes. He has no rales.  Musculoskeletal:       Patient has some nonpitting edema ankles and legs bilaterally. Good capillary refill. Good distal foot pulses          Assessment & Plan:  #1 bilateral leg edema. Not improved with change from amlodipine to diltiazem. Previous question of angioedema with ACE inhibitor. Avoid ACE inhibitors and ARBS. Add back HCTZ 12.5 mg tablet daily. Check basic metabolic panel, urine dipstick, and TSH.  #2 hypertension. Marginal control. Work on weight loss. Sodium reduction. Medication changes above.

## 2012-02-12 NOTE — Patient Instructions (Signed)
Foods Rich in Potassium Food / Potassium (mg)  Apricots, dried,  cup / 378 mg   Apricots, raw, 1 cup halves / 401 mg   Avocado,  / 487 mg   Banana, 1 large / 487 mg   Beef, lean, round, 3 oz / 202 mg   Cantaloupe, 1 cup cubes / 427 mg   Dates, medjool, 5 whole / 835 mg   Ham, cured, 3 oz / 212 mg   Lentils, dried,  cup / 458 mg   Lima beans, frozen,  cup / 258 mg   Orange, 1 large / 333 mg   Orange juice, 1 cup / 443 mg   Peaches, dried,  cup / 398 mg   Peas, split, cooked,  cup / 355 mg   Potato, boiled, 1 medium / 515 mg   Prunes, dried, uncooked,  cup / 318 mg   Raisins,  cup / 309 mg   Salmon, pink, raw, 3 oz / 275 mg   Sardines, canned , 3 oz / 338 mg   Tomato, raw, 1 medium / 292 mg   Tomato juice, 6 oz / 417 mg   Turkey, 3 oz / 349 mg  Document Released: 09/29/2005 Document Revised: 06/11/2011 Document Reviewed: 02/12/2009 [employer] Patient Information 2012 [employer]. 

## 2012-02-17 NOTE — Progress Notes (Signed)
Quick Note:  Pt informed ______ 

## 2012-02-20 ENCOUNTER — Ambulatory Visit: Payer: 59 | Admitting: Family Medicine

## 2012-02-24 ENCOUNTER — Other Ambulatory Visit: Payer: Self-pay | Admitting: *Deleted

## 2012-02-24 MED ORDER — ALPRAZOLAM 1 MG PO TABS: 1.0000 mg | ORAL_TABLET | Freq: Three times a day (TID) | ORAL | Status: DC | PRN

## 2012-02-24 NOTE — Telephone Encounter (Signed)
Alprazolam tid last filled 11-20-11, #90 with 2 refills

## 2012-02-24 NOTE — Telephone Encounter (Signed)
Refill for 3 months. 

## 2012-02-24 NOTE — Telephone Encounter (Signed)
rx called in

## 2012-02-24 NOTE — Telephone Encounter (Signed)
Patient calling back inquiring about Xanax refill

## 2012-04-09 ENCOUNTER — Other Ambulatory Visit: Payer: Self-pay | Admitting: Orthopedic Surgery

## 2012-04-09 DIAGNOSIS — M545 Low back pain: Secondary | ICD-10-CM

## 2012-04-13 ENCOUNTER — Ambulatory Visit
Admission: RE | Admit: 2012-04-13 | Discharge: 2012-04-13 | Disposition: A | Discharge status: Home / Self Care | Payer: 59 | Source: Ambulatory Visit | Attending: Orthopedic Surgery | Admitting: Orthopedic Surgery

## 2012-04-13 DIAGNOSIS — M545 Low back pain: Secondary | ICD-10-CM

## 2012-04-14 ENCOUNTER — Ambulatory Visit: Payer: 59 | Admitting: Family Medicine

## 2012-04-14 ENCOUNTER — Telehealth: Payer: Self-pay | Admitting: *Deleted

## 2012-04-14 NOTE — Telephone Encounter (Signed)
No Show for OV today, 2 month follow-up.  I did speak with pt and he "forgot".  He will reschedule, aware of no show policy

## 2012-05-10 ENCOUNTER — Emergency Department (HOSPITAL_COMMUNITY): Payer: No Typology Code available for payment source

## 2012-05-10 ENCOUNTER — Emergency Department (HOSPITAL_COMMUNITY)
Admission: EM | Admit: 2012-05-10 | Discharge: 2012-05-11 | Disposition: A | Discharge status: Home / Self Care | Payer: No Typology Code available for payment source | Attending: Emergency Medicine | Admitting: Emergency Medicine

## 2012-05-10 ENCOUNTER — Encounter (HOSPITAL_COMMUNITY): Payer: Self-pay | Admitting: *Deleted

## 2012-05-10 DIAGNOSIS — R079 Chest pain, unspecified: Secondary | ICD-10-CM | POA: Insufficient documentation

## 2012-05-10 DIAGNOSIS — S39012A Strain of muscle, fascia and tendon of lower back, initial encounter: Secondary | ICD-10-CM

## 2012-05-10 DIAGNOSIS — M542 Cervicalgia: Secondary | ICD-10-CM | POA: Insufficient documentation

## 2012-05-10 DIAGNOSIS — M549 Dorsalgia, unspecified: Secondary | ICD-10-CM | POA: Insufficient documentation

## 2012-05-10 DIAGNOSIS — IMO0002 Reserved for concepts with insufficient information to code with codable children: Secondary | ICD-10-CM | POA: Insufficient documentation

## 2012-05-10 DIAGNOSIS — I10 Essential (primary) hypertension: Secondary | ICD-10-CM | POA: Insufficient documentation

## 2012-05-10 DIAGNOSIS — M25569 Pain in unspecified knee: Secondary | ICD-10-CM | POA: Insufficient documentation

## 2012-05-10 DIAGNOSIS — M545 Low back pain, unspecified: Secondary | ICD-10-CM | POA: Insufficient documentation

## 2012-05-10 DIAGNOSIS — M25519 Pain in unspecified shoulder: Secondary | ICD-10-CM | POA: Insufficient documentation

## 2012-05-10 MED ORDER — KETOROLAC TROMETHAMINE 60 MG/2ML IM SOLN
60.0000 mg | Freq: Once | INTRAMUSCULAR | Status: AC
  Administered 2012-05-10: 60 mg via INTRAMUSCULAR
  Filled 2012-05-10: qty 2
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
## 2012-05-10 NOTE — ED Notes (Signed)
Patient returned from CT

## 2012-05-11 ENCOUNTER — Emergency Department (HOSPITAL_COMMUNITY): Payer: No Typology Code available for payment source

## 2012-05-11 MED ORDER — CYCLOBENZAPRINE HCL 10 MG PO TABS: 10.0000 mg | ORAL_TABLET | Freq: Two times a day (BID) | ORAL | Status: AC | PRN

## 2012-05-11 MED ORDER — OXYCODONE HCL 5 MG PO TABS: 5.0000 mg | ORAL_TABLET | ORAL | Status: AC | PRN

## 2012-05-12 ENCOUNTER — Telehealth: Payer: Self-pay | Admitting: Family Medicine

## 2012-05-12 ENCOUNTER — Ambulatory Visit: Payer: 59 | Admitting: Family Medicine
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
## 2012-05-17 ENCOUNTER — Encounter: Payer: Self-pay | Admitting: Family Medicine

## 2012-05-17 ENCOUNTER — Ambulatory Visit (INDEPENDENT_AMBULATORY_CARE_PROVIDER_SITE_OTHER): Payer: 59 | Admitting: Family Medicine

## 2012-05-17 ENCOUNTER — Telehealth: Payer: Self-pay | Admitting: Family Medicine

## 2012-05-17 VITALS — BP 142/98 | Temp 98.4°F | Wt 262.0 lb

## 2012-05-17 DIAGNOSIS — R0789 Other chest pain: Secondary | ICD-10-CM

## 2012-05-17 DIAGNOSIS — R071 Chest pain on breathing: Secondary | ICD-10-CM

## 2012-05-17 DIAGNOSIS — I10 Essential (primary) hypertension: Secondary | ICD-10-CM

## 2012-05-17 MED ORDER — HYDROCHLOROTHIAZIDE 12.5 MG PO CAPS: 12.5000 mg | ORAL_CAPSULE | Freq: Every day | ORAL | Status: DC
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
## 2012-05-17 NOTE — Telephone Encounter (Signed)
Pt reports he has already been taking HCTZ and it is on his history med list.  I tried to talk to pharmacist, however their phone has just rang and rang all day, no answer or VM?  It's a little white pill confirmed with pharmacist?

## 2012-05-17 NOTE — Telephone Encounter (Signed)
Pt called and said that Dr Caryl Never was suppose to have changed pts bp med, but when pt went to [facility] in [place], it was the same med that pt had before, according to pharmacist.

## 2012-05-17 NOTE — Telephone Encounter (Signed)
We ADDED HCTZ to his Diltiazem.  He should be taking BOTH.

## 2012-05-20 NOTE — Telephone Encounter (Signed)
Have him bring in meds and BP check 2-3 weeks.
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
## 2012-05-24 ENCOUNTER — Telehealth: Payer: Self-pay | Admitting: Family Medicine

## 2012-05-24 MED ORDER — ALPRAZOLAM 1 MG PO TABS: 1.0000 mg | ORAL_TABLET | Freq: Three times a day (TID) | ORAL | Status: DC | PRN

## 2012-05-24 NOTE — Telephone Encounter (Signed)
Pt called to check on status of getting Alprazolam. Pls call.

## 2012-05-24 NOTE — Telephone Encounter (Signed)
Pt requesting a refill on Xanax. Was here last week. Please send to [facility] in [place]. He is out of med. Thanks.

## 2012-05-24 NOTE — Telephone Encounter (Signed)
Refill for 3 months. 

## 2012-05-24 NOTE — Telephone Encounter (Signed)
Alprazolam TID prn last filled 02-24-12, #90 with 2 refills

## 2012-05-27 ENCOUNTER — Other Ambulatory Visit: Payer: Self-pay | Admitting: Orthopedic Surgery

## 2012-05-27 DIAGNOSIS — M545 Low back pain: Secondary | ICD-10-CM

## 2012-06-02 ENCOUNTER — Other Ambulatory Visit: Payer: Self-pay | Admitting: Family Medicine

## 2012-06-02 ENCOUNTER — Ambulatory Visit
Admission: RE | Admit: 2012-06-02 | Discharge: 2012-06-02 | Disposition: A | Discharge status: Home / Self Care | Payer: 59 | Source: Ambulatory Visit | Attending: Orthopedic Surgery | Admitting: Orthopedic Surgery

## 2012-06-02 DIAGNOSIS — M545 Low back pain: Secondary | ICD-10-CM

## 2012-06-17 ENCOUNTER — Encounter: Payer: Self-pay | Admitting: Family Medicine

## 2012-06-17 ENCOUNTER — Ambulatory Visit (INDEPENDENT_AMBULATORY_CARE_PROVIDER_SITE_OTHER): Payer: 59 | Admitting: Family Medicine

## 2012-06-17 VITALS — BP 130/94 | Temp 97.9°F | Wt 250.0 lb

## 2012-06-17 DIAGNOSIS — I10 Essential (primary) hypertension: Secondary | ICD-10-CM

## 2012-06-17 DIAGNOSIS — N529 Male erectile dysfunction, unspecified: Secondary | ICD-10-CM

## 2012-06-17 MED ORDER — TADALAFIL 5 MG PO TABS
5.0000 mg | ORAL_TABLET | Freq: Every day | ORAL | Status: DC | PRN
Start: 2012-06-17 — End: 2012-08-07
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
## 2012-06-17 NOTE — Patient Instructions (Addendum)
Continue weight loss efforts.

## 2012-06-24 ENCOUNTER — Other Ambulatory Visit: Payer: Self-pay | Admitting: Orthopedic Surgery

## 2012-06-24 DIAGNOSIS — M25562 Pain in left knee: Secondary | ICD-10-CM

## 2012-06-29 ENCOUNTER — Ambulatory Visit
Admission: RE | Admit: 2012-06-29 | Discharge: 2012-06-29 | Disposition: A | Discharge status: Home / Self Care | Payer: 59 | Source: Ambulatory Visit | Attending: Orthopedic Surgery | Admitting: Orthopedic Surgery

## 2012-06-29 DIAGNOSIS — M25562 Pain in left knee: Secondary | ICD-10-CM

## 2012-07-06 ENCOUNTER — Other Ambulatory Visit: Payer: Self-pay | Admitting: Family Medicine

## 2012-07-15 ENCOUNTER — Other Ambulatory Visit: Payer: Self-pay | Admitting: Orthopedic Surgery

## 2012-07-15 DIAGNOSIS — M25512 Pain in left shoulder: Secondary | ICD-10-CM

## 2012-07-18 ENCOUNTER — Ambulatory Visit
Admission: RE | Admit: 2012-07-18 | Discharge: 2012-07-18 | Disposition: A | Discharge status: Home / Self Care | Payer: 59 | Source: Ambulatory Visit | Attending: Orthopedic Surgery | Admitting: Orthopedic Surgery

## 2012-07-18 DIAGNOSIS — M25512 Pain in left shoulder: Secondary | ICD-10-CM

## 2012-08-07 ENCOUNTER — Encounter (HOSPITAL_COMMUNITY): Payer: Self-pay | Admitting: Physical Medicine and Rehabilitation

## 2012-08-07 ENCOUNTER — Emergency Department (HOSPITAL_COMMUNITY)
Admission: EM | Admit: 2012-08-07 | Discharge: 2012-08-07 | Disposition: A | Discharge status: Home / Self Care | Payer: 59 | Attending: Emergency Medicine | Admitting: Emergency Medicine

## 2012-08-07 DIAGNOSIS — F411 Generalized anxiety disorder: Secondary | ICD-10-CM | POA: Insufficient documentation

## 2012-08-07 DIAGNOSIS — M25519 Pain in unspecified shoulder: Secondary | ICD-10-CM | POA: Insufficient documentation

## 2012-08-07 DIAGNOSIS — K219 Gastro-esophageal reflux disease without esophagitis: Secondary | ICD-10-CM | POA: Insufficient documentation

## 2012-08-07 DIAGNOSIS — Z9889 Other specified postprocedural states: Secondary | ICD-10-CM | POA: Insufficient documentation

## 2012-08-07 DIAGNOSIS — Z8701 Personal history of pneumonia (recurrent): Secondary | ICD-10-CM | POA: Insufficient documentation

## 2012-08-07 DIAGNOSIS — Z79899 Other long term (current) drug therapy: Secondary | ICD-10-CM | POA: Insufficient documentation

## 2012-08-07 DIAGNOSIS — E785 Hyperlipidemia, unspecified: Secondary | ICD-10-CM | POA: Insufficient documentation

## 2012-08-07 DIAGNOSIS — Z8709 Personal history of other diseases of the respiratory system: Secondary | ICD-10-CM | POA: Insufficient documentation

## 2012-08-07 DIAGNOSIS — I10 Essential (primary) hypertension: Secondary | ICD-10-CM | POA: Insufficient documentation

## 2012-08-07 DIAGNOSIS — M25569 Pain in unspecified knee: Secondary | ICD-10-CM | POA: Insufficient documentation

## 2012-08-07 DIAGNOSIS — G8929 Other chronic pain: Secondary | ICD-10-CM | POA: Insufficient documentation

## 2012-08-07 MED ORDER — PREDNISONE 10 MG PO TABS: ORAL_TABLET | ORAL | Status: DC

## 2012-08-07 MED ORDER — DEXAMETHASONE SODIUM PHOSPHATE 10 MG/ML IJ SOLN
10.0000 mg | Freq: Once | INTRAMUSCULAR | Status: DC
  Filled 2012-08-07: qty 1

## 2012-08-07 NOTE — ED Notes (Signed)
Discharge instructions reviewed. Pt verbalized understanding.  
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
## 2012-08-07 NOTE — ED Provider Notes (Signed)
Medical screening examination/treatment/procedure(s) were performed by non-physician practitioner and as supervising physician I was immediately available for consultation/collaboration.   Gerhard Munch, MD 08/07/12 1323

## 2012-08-23 ENCOUNTER — Other Ambulatory Visit: Payer: Self-pay | Admitting: Family Medicine

## 2012-08-23 NOTE — Telephone Encounter (Signed)
Alprazolam TID last filled 05-24-12, #90 with 2 refills

## 2012-08-23 NOTE — Telephone Encounter (Signed)
Refill for 3 months. 

## 2012-09-16 ENCOUNTER — Telehealth: Payer: Self-pay | Admitting: Family Medicine

## 2012-09-16 NOTE — Telephone Encounter (Signed)
Yes

## 2012-09-16 NOTE — Telephone Encounter (Signed)
Pt states Dr Anne Hahn [facility]) gave him a script to have lab work done here. Pt will try to bring scipt to office. Needs to schedule.Is that ok?

## 2012-09-20 ENCOUNTER — Other Ambulatory Visit (INDEPENDENT_AMBULATORY_CARE_PROVIDER_SITE_OTHER): Payer: 59

## 2012-09-20 DIAGNOSIS — M79609 Pain in unspecified limb: Secondary | ICD-10-CM

## 2012-09-20 DIAGNOSIS — G63 Polyneuropathy in diseases classified elsewhere: Secondary | ICD-10-CM

## 2012-09-20 DIAGNOSIS — R209 Unspecified disturbances of skin sensation: Secondary | ICD-10-CM

## 2012-09-20 LAB — TSH: TSH: 2.79 u[IU]/mL (ref 0.35–5.50)

## 2012-09-20 LAB — VITAMIN B12: Vitamin B-12: 143 pg/mL — ABNORMAL LOW (ref 211–911)

## 2012-09-21 LAB — ANGIOTENSIN CONVERTING ENZYME: Angiotensin-Converting Enzyme: 15 U/L (ref 8–52)

## 2012-09-21 LAB — ANA: Anti Nuclear Antibody(ANA): NEGATIVE

## 2012-09-21 LAB — RHEUMATOID FACTOR: Rhuematoid fact SerPl-aCnc: 14 IU/mL (ref <=14)

## 2012-09-21 NOTE — Progress Notes (Signed)
Quick Note:  Pt informed, will fax labs to Dr Anne Hahn ______

## 2012-09-23 LAB — PROTEIN ELECTROPHORESIS, SERUM
Alpha-1-Globulin: 4.5 % (ref 2.9–4.9)
Beta 2: 5.2 % (ref 3.2–6.5)
Gamma Globulin: 13.8 % (ref 11.1–18.8)

## 2012-10-18 ENCOUNTER — Ambulatory Visit (INDEPENDENT_AMBULATORY_CARE_PROVIDER_SITE_OTHER): Payer: 59 | Admitting: Family Medicine

## 2012-10-18 ENCOUNTER — Encounter: Payer: Self-pay | Admitting: Family Medicine

## 2012-10-18 VITALS — BP 130/88 | Temp 98.3°F | Wt 254.0 lb

## 2012-10-18 DIAGNOSIS — F411 Generalized anxiety disorder: Secondary | ICD-10-CM

## 2012-10-18 DIAGNOSIS — F419 Anxiety disorder, unspecified: Secondary | ICD-10-CM

## 2012-10-18 DIAGNOSIS — I10 Essential (primary) hypertension: Secondary | ICD-10-CM

## 2012-10-18 DIAGNOSIS — E538 Deficiency of other specified B group vitamins: Secondary | ICD-10-CM
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
## 2012-10-18 NOTE — Patient Instructions (Addendum)

## 2012-10-30 ENCOUNTER — Other Ambulatory Visit: Payer: Self-pay | Admitting: Family Medicine

## 2012-11-08 ENCOUNTER — Telehealth: Payer: Self-pay | Admitting: Family Medicine

## 2012-11-08 NOTE — Telephone Encounter (Signed)
Patient Information:  Caller Name: Bartosz  Phone: [phone]  Patient: Wayne Rhodes, Wayne Rhodes  Gender: Male  DOB: 1956/01/21  Age: 57 Years  PCP: Evelena Peat (Family Practice)  Office Follow Up:  Does the office need to follow up with this patient?: No  Instructions For The Office: N/A  RN Note:  Per [facility] Medication Reaction protocol See Provider in 24 hrs d/t 'Persistent or recurrent symptom not responding to treatment' Symptoms  Reason For Call & Symptoms: Caller regarding dry mouth in the mornings and thinks it is related to Cialis.  No other sx to report,  Caller wants to know if that is what is causing an extreme dry mouth in the mornings.    Reviewed Health History In EMR: Yes  Reviewed Medications In EMR: Yes  Reviewed Allergies In EMR: Yes  Reviewed Surgeries / Procedures: Yes  Date of Onset of Symptoms:  07/2012  Treatments Tried: Lozenges for dry mouth  Treatments Tried Worked: No  Guideline(s) Used:  No Protocol Available - Sick Adult  Disposition Per Guideline:   See Today or Tomorrow in Office  Reason For Disposition Reached:   Nursing judgment  Advice Given:  Call Back If:  New symptoms develop  You become worse.  Appointment Scheduled:  11/09/2012 09:45:00 Appointment Scheduled Provider:  Evelena Peat [facility])

## 2012-11-09 ENCOUNTER — Encounter: Payer: Self-pay | Admitting: Family Medicine

## 2012-11-09 ENCOUNTER — Ambulatory Visit (INDEPENDENT_AMBULATORY_CARE_PROVIDER_SITE_OTHER): Payer: 59 | Admitting: Family Medicine

## 2012-11-09 VITALS — BP 130/90 | Temp 98.8°F | Wt 244.0 lb

## 2012-11-09 DIAGNOSIS — R739 Hyperglycemia, unspecified: Secondary | ICD-10-CM

## 2012-11-09 DIAGNOSIS — R634 Abnormal weight loss: Secondary | ICD-10-CM

## 2012-11-09 DIAGNOSIS — I10 Essential (primary) hypertension: Secondary | ICD-10-CM

## 2012-11-09 DIAGNOSIS — R7309 Other abnormal glucose: Secondary | ICD-10-CM

## 2012-11-09 DIAGNOSIS — R638 Other symptoms and signs concerning food and fluid intake: Secondary | ICD-10-CM

## 2012-11-09 LAB — GLUCOSE, POCT (MANUAL RESULT ENTRY): POC Glucose: 144 mg/dl — AB (ref 70–99)

## 2012-11-09 IMAGING — RF DG CERVICAL SPINE 2 OR 3 VIEWS
1 series · 2 of 2 positions shown · non-contrast
Comparison: September 17, 2011

CLINICAL DATA: Status post ACDF at C5-C6

CERVICAL SPINE - 2-3 VIEW

[Series 1: run · 2 of 2 slices shown]
[im 1/2]
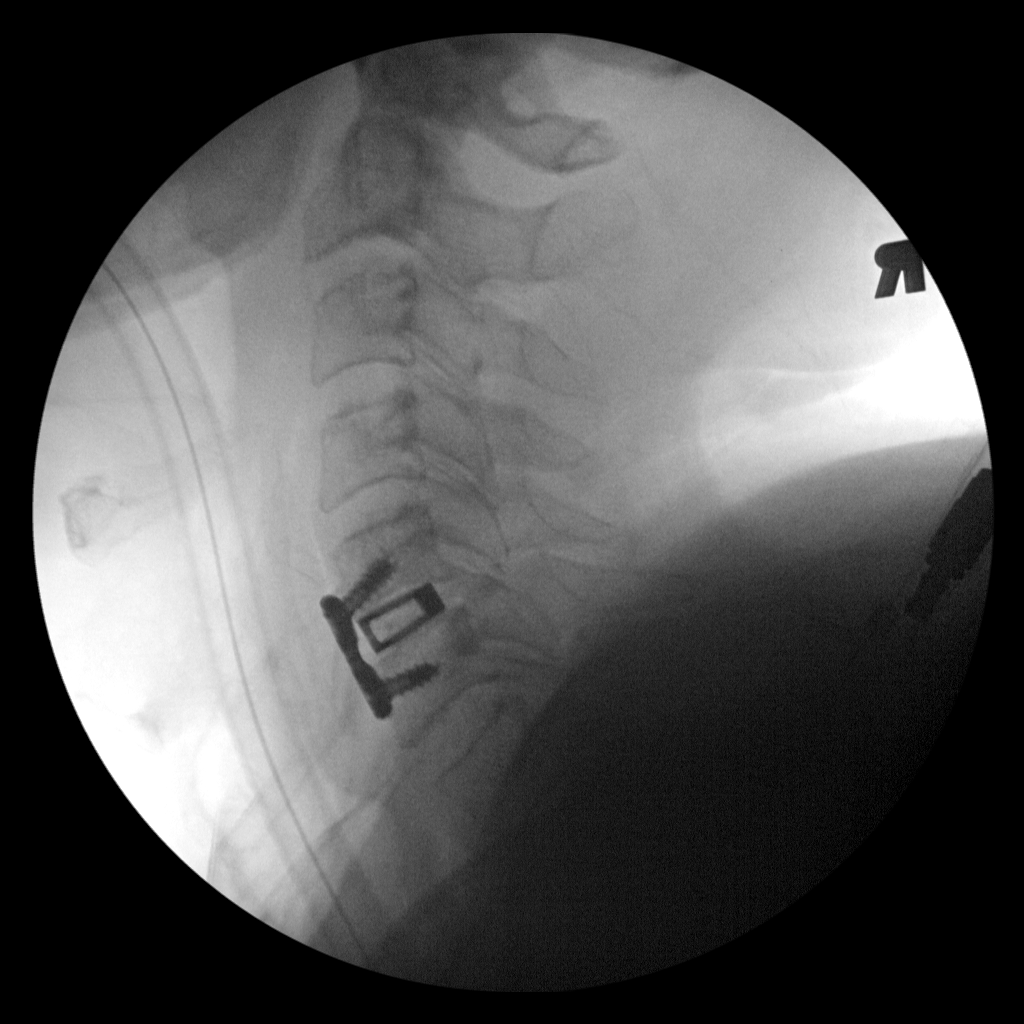
[im 2/2]
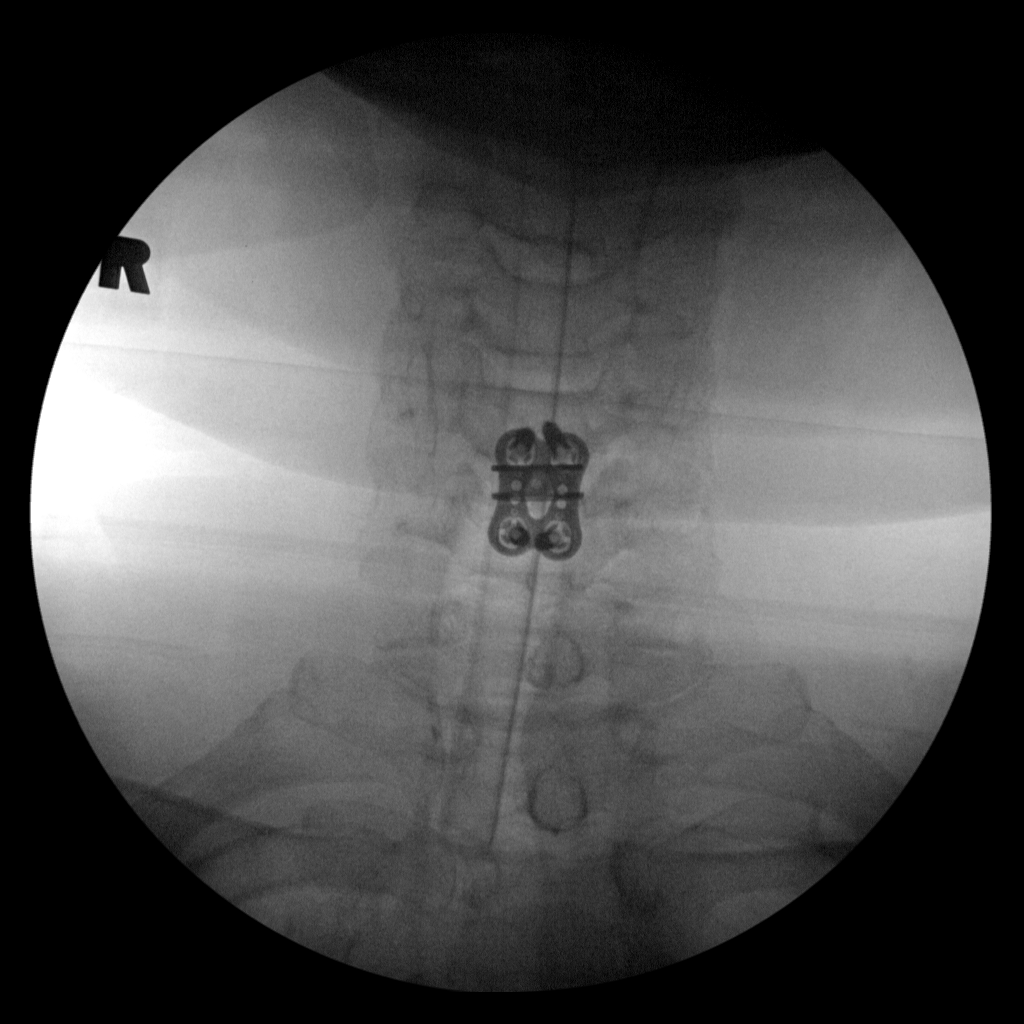

[2 of 2 positions shown; findings below may reference images not displayed]

FINDINGS: There is anterior fusion of C5-C6 now with normal lateral
alignment and preservation of the disc space.  There is no peri
hardware lucency.
IMPRESSION: C5-C6 fusion with no evidence of hardware complication and normal
alignment.

## 2012-11-09 IMAGING — CR DG CERVICAL SPINE 2 OR 3 VIEWS
2 series · 2 of 2 positions shown · non-contrast
Comparison: Earlier today at [DATE]

CLINICAL DATA: Postop cervical spine

CERVICAL SPINE - 2-3 VIEW

[view not recorded (1 of 2)]
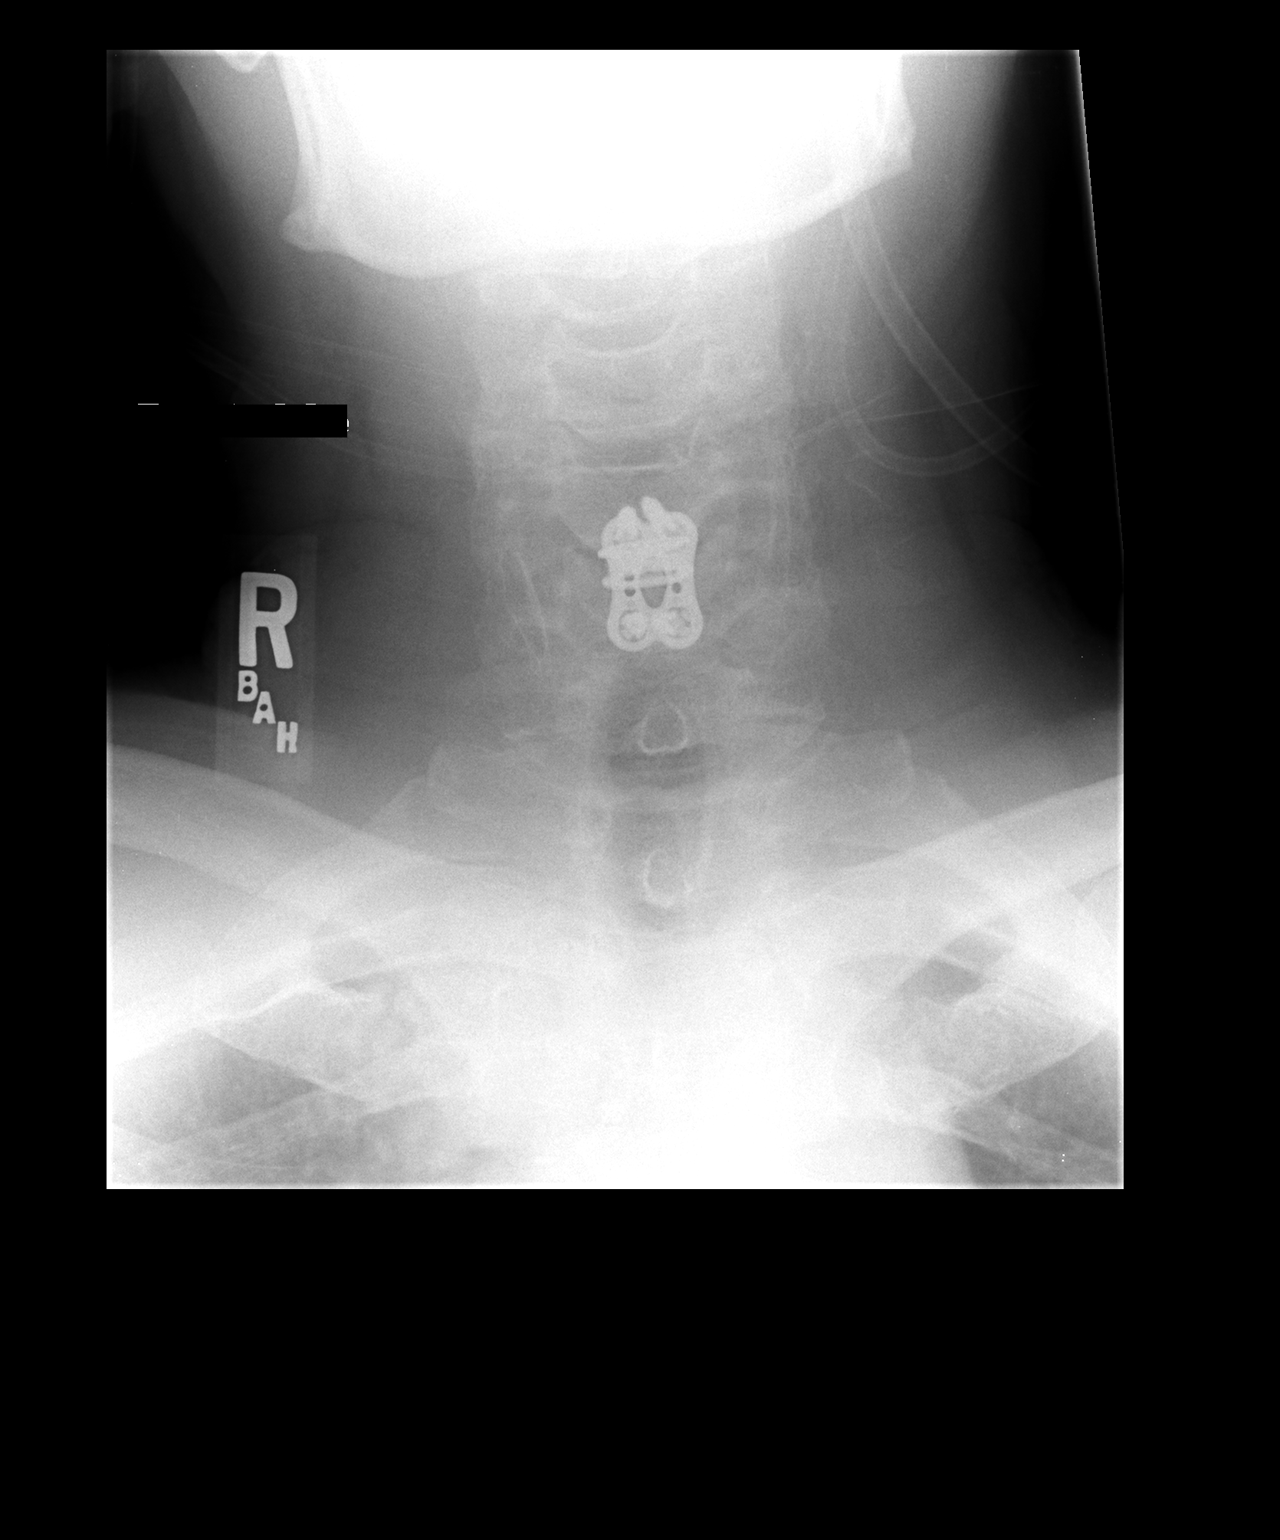

[view not recorded (2 of 2)]
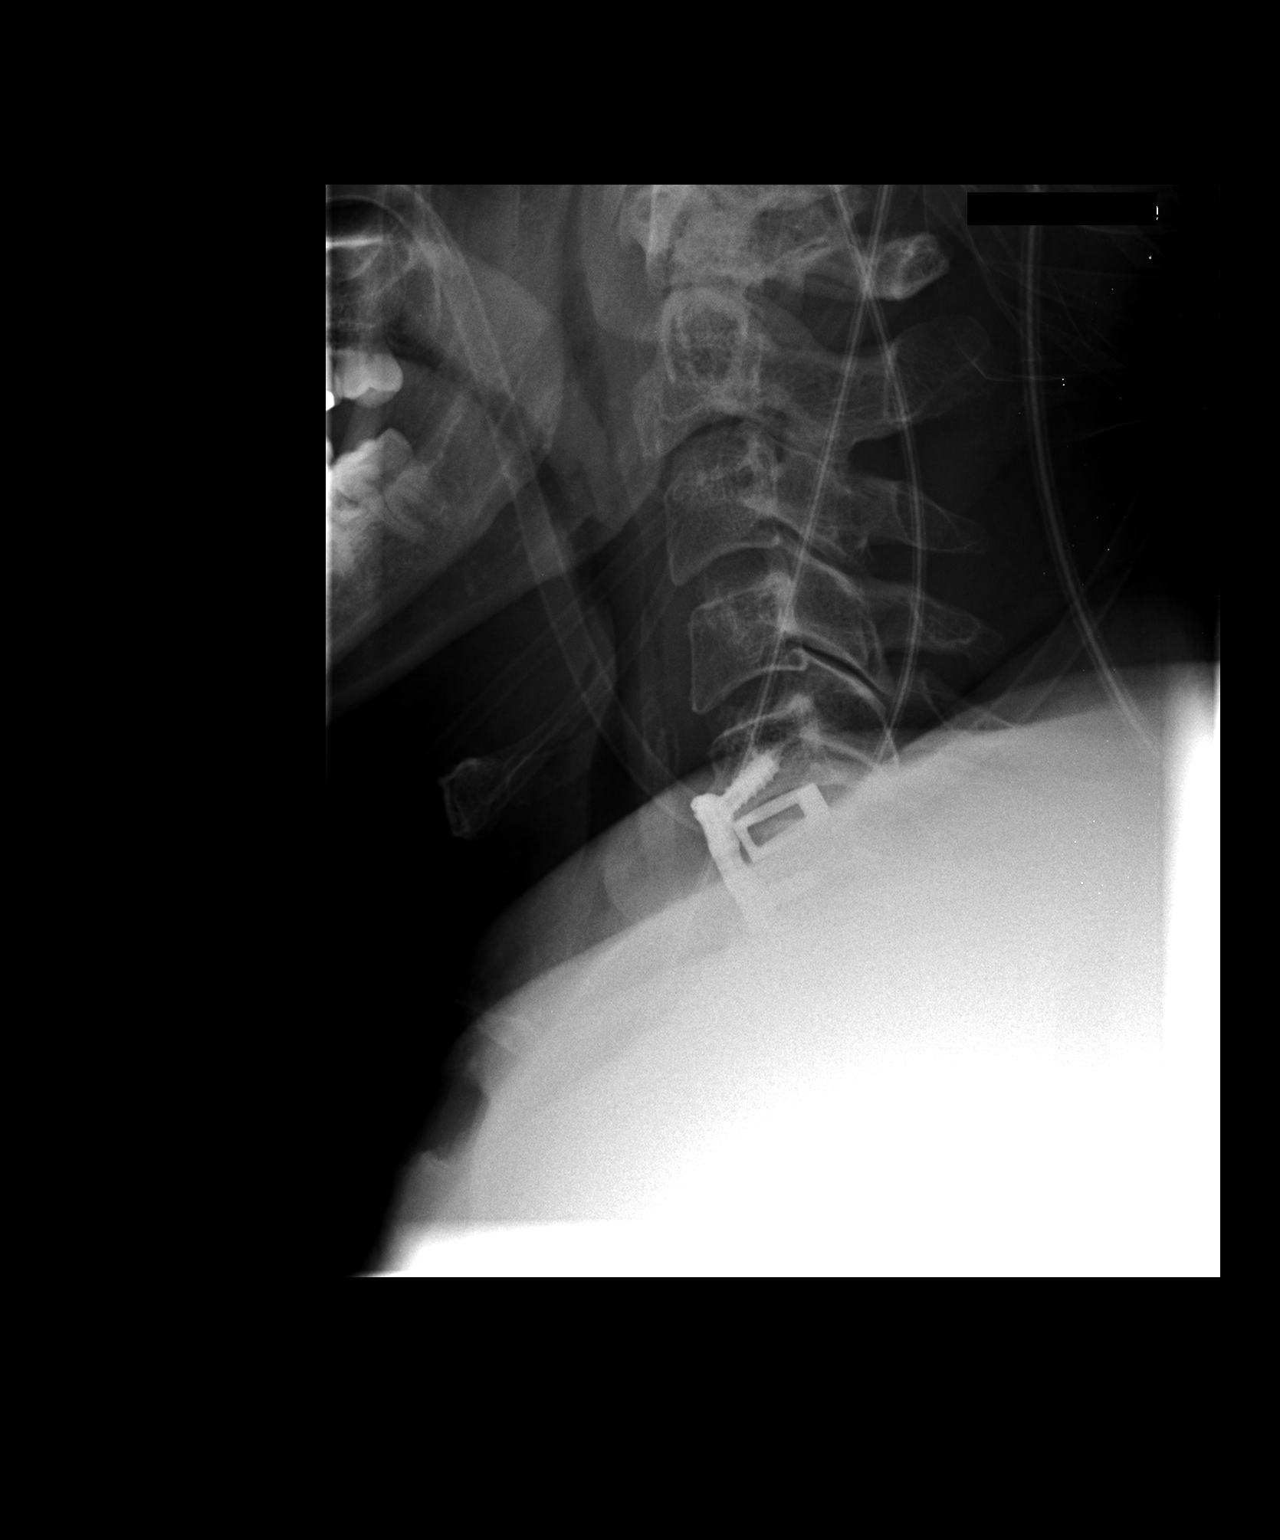

[2 of 2 positions shown; findings below may reference images not displayed]

FINDINGS: C5-C6 fusion is again noted with normal AP and lateral
alignment and no peri hardware lucency.  The disc space is
maintained.  The prevertebral soft tissue stripe is within normal
limits.
IMPRESSION: Stable appearance of the C5-C6 fusion with normal alignment and no
evidence of hardware complication.

## 2012-11-09 NOTE — Progress Notes (Signed)
  Subjective:    Patient ID: Wayne Rhodes, male    DOB: April 09, 1956, 57 y.o.   MRN: [id number]  HPI Acute visit.  Complains of increased thirst.  Duration 3-4 week. Symptoms worse in AM.  No nasal congestion. Eats out frequently-?high sodium diet. 10 pound weight loss over past month.  Good appetite No urine frequency. No dry eyes.  Increased caffeine use (chronic) No decongestants.  Recently started Cialis which would not likely cause the symptoms. No anti-cholinergic use.  Hypertension stable on 2 drug regimen.  Compliant with therapy.  Past Medical History  Diagnosis Date  . Hyperlipidemia   . Hypertension   . Pneumonia     had pneumonia 4-5 months ago  . Recurrent upper respiratory infection (URI)     recent chest cold - treated with mucinex and cough medicine - now improved  . GERD (gastroesophageal reflux disease)   . Anxiety     takes xanax for anxiety   Past Surgical History  Procedure Date  . Knee arthroscopy     left  . Elbow surgery     both elbow surgery  . Anterior cervical decomp/discectomy fusion 09/25/2011    Procedure: ANTERIOR CERVICAL DECOMPRESSION/DISCECTOMY FUSION 1 LEVEL;  Surgeon: Alvy Beal;  Location: [facility] OR;  Service: Orthopedics;  Laterality: N/A;  ACDF Cervical 5-6    reports that he has never smoked. He does not have any smokeless tobacco history on file. He reports that he drinks about .6 ounces of alcohol per week. He reports that he does not use illicit drugs. family history includes Cancer in his father. Allergies  Allergen Reactions  . Clonazepam     Angioedema   . Quinapril     Angioedema   . Sertraline Hcl Other (See Comments)    Suicidal thoughts      Review of Systems  Constitutional: Negative for appetite change and fatigue.  HENT: Negative for congestion, sore throat and trouble swallowing.   Respiratory: Negative for shortness of breath.   Cardiovascular: Negative for chest pain.  Genitourinary: Negative for  frequency.       Objective:   Physical Exam  Constitutional: He appears well-developed and well-nourished.  HENT:  Mouth/Throat: Oropharynx is clear and moist.  Neck: Neck supple. No thyromegaly present.  Cardiovascular: Normal rate and regular rhythm.   Pulmonary/Chest: Effort normal and breath sounds normal. No respiratory distress. He has no wheezes. He has no rales.  Musculoskeletal: He exhibits no edema.  Lymphadenopathy:    He has no cervical adenopathy.          Assessment & Plan:  Patient complains of increased thirst. Nonfocal exam. No urine frequency but in view of recent weight loss check blood sugar to rule out diabetes. Question if related to increased sodium consumption. No dry eyes to suggest likely Sjogren's syndrome. No anticholinergic use.  Fasting blood sugar 144.  Literature given on lifestyle management of diabetes.  He will reduce sodas and starches, try to lose some weight, and repeat fasting CBG in one month and get A1C then.

## 2012-11-09 NOTE — Patient Instructions (Addendum)

## 2012-11-18 ENCOUNTER — Other Ambulatory Visit: Payer: Self-pay | Admitting: Family Medicine

## 2012-12-10 ENCOUNTER — Ambulatory Visit (INDEPENDENT_AMBULATORY_CARE_PROVIDER_SITE_OTHER): Payer: 59 | Admitting: Family Medicine

## 2012-12-10 ENCOUNTER — Encounter: Payer: Self-pay | Admitting: Family Medicine

## 2012-12-10 VITALS — BP 120/74 | Temp 97.9°F | Wt 241.0 lb

## 2012-12-10 DIAGNOSIS — K219 Gastro-esophageal reflux disease without esophagitis: Secondary | ICD-10-CM

## 2012-12-10 DIAGNOSIS — B356 Tinea cruris: Secondary | ICD-10-CM

## 2012-12-10 DIAGNOSIS — I10 Essential (primary) hypertension: Secondary | ICD-10-CM

## 2012-12-10 LAB — HEMOGLOBIN A1C: Hgb A1c MFr Bld: 5.1 % (ref 4.6–6.5)

## 2012-12-10 LAB — GLUCOSE, POCT (MANUAL RESULT ENTRY): POC Glucose: 121 mg/dl — AB (ref 70–99)

## 2012-12-10 MED ORDER — CLOTRIMAZOLE-BETAMETHASONE 1-0.05 % EX CREA: TOPICAL_CREAM | Freq: Two times a day (BID) | CUTANEOUS | Status: DC

## 2012-12-10 NOTE — Progress Notes (Signed)
  Subjective:    Patient ID: Wayne Rhodes, male    DOB: 1956/05/07, 57 y.o.   MRN: [id number]  HPI Patient seen with several issues as follows  Recent increased GERD symptoms. Exacerbated by spicy foods and beer Patient noted burning sensation substernal at rest and worse after foods above Improved with Prilosec. No dysphagia. No appetite or weight changes.  New acute problem pruritic rash groin area bilaterally. No alleviating factors. Moderate itching. No exacerbating factors  Recent hyperglycemia with fasting blood sugar 144. He's reduce sodium intake but no other dietary changes No significant symptoms of hyperglycemia. Fasting blood sugar today 121. We planned A1c baseline  Hypertension which is been stable on HCTZ and diltiazem. Previous angioedema related to ACE inhibitor use  Past Medical History  Diagnosis Date  . Hyperlipidemia   . Hypertension   . Pneumonia     had pneumonia 4-5 months ago  . Recurrent upper respiratory infection (URI)     recent chest cold - treated with mucinex and cough medicine - now improved  . GERD (gastroesophageal reflux disease)   . Anxiety     takes xanax for anxiety   Past Surgical History  Procedure Laterality Date  . Knee arthroscopy      left  . Elbow surgery      both elbow surgery  . Anterior cervical decomp/discectomy fusion  09/25/2011    Procedure: ANTERIOR CERVICAL DECOMPRESSION/DISCECTOMY FUSION 1 LEVEL;  Surgeon: Alvy Beal;  Location: [facility] OR;  Service: Orthopedics;  Laterality: N/A;  ACDF Cervical 5-6    reports that he has never smoked. He does not have any smokeless tobacco history on file. He reports that he drinks about 0.6 ounces of alcohol per week. He reports that he does not use illicit drugs. family history includes Cancer in his father. Allergies  Allergen Reactions  . Clonazepam     Angioedema   . Quinapril     Angioedema   . Sertraline Hcl Other (See Comments)    Suicidal thoughts      Review  of Systems  Constitutional: Negative for fatigue.  Eyes: Negative for visual disturbance.  Respiratory: Negative for cough, chest tightness and shortness of breath.   Cardiovascular: Negative for chest pain, palpitations and leg swelling.  Skin: Positive for rash.  Neurological: Negative for dizziness, syncope, weakness, light-headedness and headaches.       Objective:   Physical Exam  Constitutional: He appears well-developed and well-nourished.  Neck: Neck supple. No thyromegaly present.  Cardiovascular: Normal rate and regular rhythm.   Pulmonary/Chest: Effort normal and breath sounds normal. No respiratory distress. He has no wheezes. He has no rales.  Abdominal: Soft. Bowel sounds are normal. He exhibits no distension. There is no tenderness. There is no rebound.  Musculoskeletal: He exhibits no edema.  Lymphadenopathy:    He has no cervical adenopathy.  Skin:  Erythematous well-demarcated rash groin region bilaterally          Assessment & Plan:  #1 hyperglycemia. One fasting blood sugar over 126 which was 144 last visit. Fasting blood sugar today 121. Check baseline A1c #2 tenia cruris. Keep area dry. Lotrisone cream twice daily #3 GERD. Lifestyle management discuss. Continue over-the-counter probably second touch base if not resolving next couple of weeks #4 hypertension. Stable at goal

## 2012-12-10 NOTE — Patient Instructions (Addendum)
Gastroesophageal Reflux Disease, Adult Gastroesophageal reflux disease (GERD) happens when acid from your stomach flows up into the esophagus. When acid comes in contact with the esophagus, the acid causes soreness (inflammation) in the esophagus. Over time, GERD may create small holes (ulcers) in the lining of the esophagus. CAUSES   Increased body weight. This puts pressure on the stomach, making acid rise from the stomach into the esophagus.  Smoking. This increases acid production in the stomach.  Drinking alcohol. This causes decreased pressure in the lower esophageal sphincter (valve or ring of muscle between the esophagus and stomach), allowing acid from the stomach into the esophagus.  Late evening meals and a full stomach. This increases pressure and acid production in the stomach.  A malformed lower esophageal sphincter. Sometimes, no cause is found. SYMPTOMS   Burning pain in the lower part of the mid-chest behind the breastbone and in the mid-stomach area. This may occur twice a week or more often.  Trouble swallowing.  Sore throat.  Dry cough.  Asthma-like symptoms including chest tightness, shortness of breath, or wheezing. DIAGNOSIS  Your caregiver may be able to diagnose GERD based on your symptoms. In some cases, X-rays and other tests may be done to check for complications or to check the condition of your stomach and esophagus. TREATMENT  Your caregiver may recommend over-the-counter or prescription medicines to help decrease acid production. Ask your caregiver before starting or adding any new medicines.  HOME CARE INSTRUCTIONS   Change the factors that you can control. Ask your caregiver for guidance concerning weight loss, quitting smoking, and alcohol consumption.  Avoid foods and drinks that make your symptoms worse, such as:  Caffeine or alcoholic drinks.  Chocolate.  Peppermint or mint flavorings.  Garlic and onions.  Spicy foods.  Citrus fruits,  such as oranges, lemons, or limes.  Tomato-based foods such as sauce, chili, salsa, and pizza.  Fried and fatty foods.  Avoid lying down for the 3 hours prior to your bedtime or prior to taking a nap.  Eat small, frequent meals instead of large meals.  Wear loose-fitting clothing. Do not wear anything tight around your waist that causes pressure on your stomach.  Raise the head of your bed 6 to 8 inches with wood blocks to help you sleep. Extra pillows will not help.  Only take over-the-counter or prescription medicines for pain, discomfort, or fever as directed by your caregiver.  Do not take aspirin, ibuprofen, or other nonsteroidal anti-inflammatory drugs (NSAIDs). SEEK IMMEDIATE MEDICAL CARE IF:   You have pain in your arms, neck, jaw, teeth, or back.  Your pain increases or changes in intensity or duration.  You develop nausea, vomiting, or sweating (diaphoresis).  You develop shortness of breath, or you faint.  Your vomit is green, yellow, black, or looks like coffee grounds or blood.  Your stool is red, bloody, or black. These symptoms could be signs of other problems, such as heart disease, gastric bleeding, or esophageal bleeding. MAKE SURE YOU:   Understand these instructions.  Will watch your condition.  Will get help right away if you are not doing well or get worse. Document Released: 07/09/2005 Document Revised: 12/22/2011 Document Reviewed: 04/18/2011 [facility] Patient Information 2013 [place], [place]. Jock Itch Jock itch is a fungal infection of the skin in the groin area. It is sometimes called "ringworm" even though it is not caused by a worm. A fungus is a type of germ that thrives in dark, damp places.  CAUSES  This infection may spread from:  A fungus infection elsewhere on the body (such as athlete's foot).  Sharing towels or clothing. This infection is more common in:  Hot, humid climates.  People who wear tight-fitting clothing or wet bathing  suits for long periods of time.  Athletes.  Overweight people.  People with diabetes. SYMPTOMS  Jock itch causes the following symptoms:  Red, pink or brown rash in the groin. Rash may spread to the thighs, anus, and buttocks.  Itching. DIAGNOSIS  Your caregiver may make the diagnosis by looking at the rash. Sometimes a skin scraping will be sent to test for fungus. Testing can be done either by looking under the microscope or by doing a culture (test to try to grow the fungus). A culture can take up to 2 weeks to come back. TREATMENT  Jock itch may be treated with:  Skin cream or ointment to kill fungus.  Medicine by mouth to kill fungus.  Skin cream or ointment to calm the itching.  Compresses or medicated powders to dry the infected skin. HOME CARE INSTRUCTIONS   Be sure to treat the rash completely. Follow your caregiver's instructions. It can take a couple of weeks to treat. If you do not treat the infection long enough, the rash can come back.  Wear loose-fitting clothing.  Men should wear cotton boxer shorts.  Women should wear cotton underwear.  Avoid hot baths.  Dry the groin area well after bathing. SEEK MEDICAL CARE IF:   Your rash is worse.  Your rash is spreading.  Your rash returns after treatment is finished.  Your rash is not gone in 4 weeks. Fungal infections are slow to respond to treatment. Some redness may remain for several weeks after the fungus is gone. SEEK IMMEDIATE MEDICAL CARE IF:  The area becomes red, warm, tender, and swollen.  You have a fever. Document Released: 09/19/2002 Document Revised: 12/22/2011 Document Reviewed: 08/18/2008 [facility] Patient Information 2013 [place], [place].

## 2012-12-10 NOTE — Progress Notes (Signed)
Quick Note:  Pt informed ______ 

## 2012-12-15 ENCOUNTER — Other Ambulatory Visit: Payer: Self-pay | Admitting: Family Medicine

## 2012-12-15 NOTE — Telephone Encounter (Signed)
Pt is following up on request for ALPRAZolam Prudy Feeler) 1 MG tablet. 90 tablets Last filled 11/18/12 (26 days) Pt said he is out.

## 2012-12-16 NOTE — Telephone Encounter (Signed)
Last month on 11-14-12 I filled #90 with 0 refills when Dr Caryl Never was gone. On 08-23-12 we filled #90 with 2 refills (pt takes TID)

## 2012-12-17 NOTE — Telephone Encounter (Signed)
Refill for 3 months. 

## 2012-12-30 ENCOUNTER — Telehealth: Payer: Self-pay | Admitting: Family Medicine

## 2012-12-30 NOTE — Telephone Encounter (Signed)
Patient was here 09/20/12 for labs that were sent to [facility]. Insurance is denying, stating dx codes do not support the services:  ANGIOTENSIN Converting enzyme, PROTEIN ELECTROPHORESIS, ANTI NUCLEAR AB REFLEX, RHEUMATOID FACTOR  Codes billed for all were: 729.5, 782, 357.4  Please let me know if there are any other possible codes I can add to resubmit to insurance. If not, I will let them know. Thank you.

## 2012-12-30 NOTE — Telephone Encounter (Signed)
These labs were ordered by his neurologist (Dr Anne Hahn) and we simply drew them here. He will need to check with ordering physician.

## 2012-12-31 NOTE — Telephone Encounter (Signed)
Will do!

## 2013-01-12 ENCOUNTER — Other Ambulatory Visit: Payer: Self-pay | Admitting: Family Medicine

## 2013-01-27 ENCOUNTER — Encounter: Payer: Self-pay | Admitting: Family Medicine

## 2013-01-27 ENCOUNTER — Ambulatory Visit (INDEPENDENT_AMBULATORY_CARE_PROVIDER_SITE_OTHER): Payer: 59 | Admitting: Family Medicine

## 2013-01-27 VITALS — BP 122/80 | HR 80 | Temp 98.7°F | Wt 247.0 lb

## 2013-01-27 DIAGNOSIS — Z299 Encounter for prophylactic measures, unspecified: Secondary | ICD-10-CM

## 2013-01-27 DIAGNOSIS — R7309 Other abnormal glucose: Secondary | ICD-10-CM

## 2013-01-27 DIAGNOSIS — I10 Essential (primary) hypertension: Secondary | ICD-10-CM

## 2013-01-27 DIAGNOSIS — R739 Hyperglycemia, unspecified: Secondary | ICD-10-CM

## 2013-01-27 DIAGNOSIS — Z01818 Encounter for other preprocedural examination: Secondary | ICD-10-CM

## 2013-01-27 NOTE — Progress Notes (Signed)
  Subjective:    Patient ID: Wayne Rhodes, male    DOB: 1955/11/30, 57 y.o.   MRN: [id number]  HPI Patient here for presurgical consultation. Preparing to have shoulder surgery. He has history of hypertension which has been fairly well controlled with hydrochlorothiazide and diltiazem. No cardiac history. No history of cerebrovascular disease. No chronic lung problems. Patient is nonsmoker. Prediabetes by recent labs. Recent A1c 5.1%. He denies any recent fever, chills, headaches, chest pains, or dyspnea Compliant with all medications  Past Medical History  Diagnosis Date  . Hyperlipidemia   . Hypertension   . Pneumonia     had pneumonia 4-5 months ago  . Recurrent upper respiratory infection (URI)     recent chest cold - treated with mucinex and cough medicine - now improved  . GERD (gastroesophageal reflux disease)   . Anxiety     takes xanax for anxiety   Past Surgical History  Procedure Laterality Date  . Knee arthroscopy      left  . Elbow surgery      both elbow surgery  . Anterior cervical decomp/discectomy fusion  09/25/2011    Procedure: ANTERIOR CERVICAL DECOMPRESSION/DISCECTOMY FUSION 1 LEVEL;  Surgeon: Alvy Beal;  Location: [facility] OR;  Service: Orthopedics;  Laterality: N/A;  ACDF Cervical 5-6    reports that he has never smoked. He does not have any smokeless tobacco history on file. He reports that he drinks about 0.6 ounces of alcohol per week. He reports that he does not use illicit drugs. family history includes Cancer in his father. Allergies  Allergen Reactions  . Clonazepam     Angioedema   . Quinapril     Angioedema   . Sertraline Hcl Other (See Comments)    Suicidal thoughts      Review of Systems  Constitutional: Negative for appetite change, fatigue and unexpected weight change.  Eyes: Negative for visual disturbance.  Respiratory: Negative for cough, chest tightness and shortness of breath.   Cardiovascular: Negative for chest pain,  palpitations and leg swelling.  Gastrointestinal: Negative for nausea, vomiting and abdominal pain.  Endocrine: Negative for polydipsia and polyuria.  Genitourinary: Negative for dysuria.  Neurological: Negative for dizziness, syncope, weakness, light-headedness and headaches.       Objective:   Physical Exam  Constitutional: He appears well-developed and well-nourished. No distress.  Neck: Neck supple. No thyromegaly present.  No carotid bruits  Cardiovascular: Normal rate and regular rhythm.   Pulmonary/Chest: Effort normal and breath sounds normal. No respiratory distress. He has no wheezes. He has no rales.  Abdominal: Soft. Bowel sounds are normal. He exhibits no distension and no mass. There is no tenderness. There is no rebound and no guarding.  Musculoskeletal: He exhibits no edema.          Assessment & Plan:  Preoperative assessment. EKG sinus rhythm with no acute findings. Blood pressure well controlled by today's reading. No contraindications for surgery. Forms completed. History prediabetes.  Recent blood sugars stable. Hypertension.  Well controlled.

## 2013-03-09 ENCOUNTER — Ambulatory Visit: Payer: 59 | Admitting: Family Medicine

## 2013-05-02 ENCOUNTER — Other Ambulatory Visit: Payer: Self-pay | Admitting: Family Medicine

## 2013-05-06 ENCOUNTER — Other Ambulatory Visit: Payer: Self-pay | Admitting: Family Medicine

## 2013-05-06 NOTE — Telephone Encounter (Signed)
Refill for 3 months. 

## 2013-05-06 NOTE — Telephone Encounter (Signed)
Last refill 12/15/12 #90 no refills

## 2013-05-16 ENCOUNTER — Encounter: Payer: Self-pay | Admitting: Family Medicine

## 2013-05-16 ENCOUNTER — Ambulatory Visit (INDEPENDENT_AMBULATORY_CARE_PROVIDER_SITE_OTHER): Payer: 59 | Admitting: Family Medicine

## 2013-05-16 VITALS — BP 128/90 | HR 85 | Temp 98.4°F | Wt 248.0 lb

## 2013-05-16 DIAGNOSIS — N529 Male erectile dysfunction, unspecified: Secondary | ICD-10-CM

## 2013-05-16 MED ORDER — SILDENAFIL CITRATE 100 MG PO TABS: 100.0000 mg | ORAL_TABLET | ORAL | Status: DC | PRN

## 2013-05-16 NOTE — Progress Notes (Signed)
  Subjective:    Patient ID: Wayne Rhodes, male    DOB: [birth year], 57 y.o.   MRN: [id number]  HPI Patient seen with erectile dysfunction Currently using Cialis 5 mg daily Able to achieve erection but unable to maintain. No nitroglycerin use. Good libido. Nonsmoker. No history of CAD or peripheral vascular disease.  Previously has used Viagra which he thought worked better  Past Medical History  Diagnosis Date  . Hyperlipidemia   . Hypertension   . Pneumonia     had pneumonia 4-5 months ago  . Recurrent upper respiratory infection (URI)     recent chest cold - treated with mucinex and cough medicine - now improved  . GERD (gastroesophageal reflux disease)   . Anxiety     takes xanax for anxiety   Past Surgical History  Procedure Laterality Date  . Knee arthroscopy      left  . Elbow surgery      both elbow surgery  . Anterior cervical decomp/discectomy fusion  09/25/2011    Procedure: ANTERIOR CERVICAL DECOMPRESSION/DISCECTOMY FUSION 1 LEVEL;  Surgeon: Alvy Beal;  Location: [facility] OR;  Service: Orthopedics;  Laterality: N/A;  ACDF Cervical 5-6    reports that he has never smoked. He does not have any smokeless tobacco history on file. He reports that he drinks about 0.6 ounces of alcohol per week. He reports that he does not use illicit drugs. family history includes Cancer in his father. Allergies  Allergen Reactions  . Clonazepam     Angioedema   . Quinapril     Angioedema   . Sertraline Hcl Other (See Comments)    Suicidal thoughts      Review of Systems  Constitutional: Negative for fatigue.  Eyes: Negative for visual disturbance.  Respiratory: Negative for cough, chest tightness and shortness of breath.   Cardiovascular: Negative for chest pain, palpitations and leg swelling.  Neurological: Negative for dizziness, syncope, weakness, light-headedness and headaches.       Objective:   Physical Exam  Constitutional: He appears well-developed and  well-nourished.  Cardiovascular: Normal rate and regular rhythm.   Pulmonary/Chest: Effort normal and breath sounds normal. No respiratory distress. He has no wheezes. He has no rales.  Musculoskeletal: He exhibits no edema.          Assessment & Plan:  Erectile dysfunction. Stopped Cialis. Start Viagra 100 mg one half to one tablet daily as needed.

## 2013-05-16 NOTE — Patient Instructions (Addendum)
Erectile Dysfunction Erectile dysfunction (ED) is the inability to get a good enough erection to have sexual intercourse. ED may involve:  Inability to get an erection.  Lack of enough hardness to allow penetration.  Loss of the erection before sex is finished.  Premature ejaculation.  Any combination of these problems if they occur more than 25% of the time. CAUSES  Certain drugs, such as:  Pain relievers.  Antihistamines.  Antidepressants.  Blood pressure medicines.  Water pills.  Ulcer medicines.  Muscle relaxants.  Illegal drugs.  Excessive drinking.  Psychological causes, such as:  Anxiety.  Depression.  Sadness.  Exhaustion.  Performance fear.  Stress.  Physical causes, such as:  Artery problems. This may include diabetes, smoking, liver disease, or atherosclerosis.  High blood pressure.  Hormonal problems, such as low testosterone.  Obesity.  Nerve problems. This may include back or pelvic injuries, diabetes, multiple sclerosis, Parkinson's disease, or some surgeries. SYMPTOMS  Inability to get an erection.  Lack of enough hardness to allow penetration.  Loss of the erection before sex is finished.  Premature ejaculation.  Normal erections at some times, but with frequent unsatisfactory episodes.  Orgasms that are not satisfactory in sensation or frequency.  Low sexual satisfaction in either partner because of erection problems.  A curved penis occurring with erection. The curve may cause pain or may be too curved to allow for intercourse.  Never having nighttime erections. DIAGNOSIS Your caregiver can often diagnose this condition by:  Performing a physical exam to find other diseases or specific problems with the penis.  Asking you detailed questions about the problem.  Performing blood tests to check for diabetes or to measure hormone levels.  Performing urine tests to find other underlying health  conditions.  Performing an ultrasound to check for scarring.  Performing a test to check blood flow to the penis.  Doing a sleep study at home to measure nighttime erections. TREATMENT   You may be prescribed medicines by mouth.  You may be given medicine injections into the penis.  You may be prescribed a vacuum pump with a ring.  Penile implant surgery may be performed. You may receive:  An inflatable implant.  A semi-rigid implant.  Blood vessel surgery may be performed. HOME CARE INSTRUCTIONS  Take all medicine as directed by your caregiver. Do not take any other medicines without talking to your caregiver first.  Follow your caregiver's directions for specific treatments as prescribed.  Follow up with your caregiver as directed. Document Released: 09/26/2000 Document Revised: 12/22/2011 Document Reviewed: 01/19/2011 [employer] Patient Information 2014 [employer].  

## 2013-05-31 ENCOUNTER — Other Ambulatory Visit: Payer: Self-pay | Admitting: Family Medicine

## 2013-05-31 NOTE — Telephone Encounter (Signed)
Last refill 05/06/13 #90 no refill

## 2013-05-31 NOTE — Telephone Encounter (Signed)
Not due until 8-24.  May refill then.

## 2013-06-04 ENCOUNTER — Other Ambulatory Visit: Payer: Self-pay | Admitting: Family Medicine
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
## 2013-07-06 ENCOUNTER — Other Ambulatory Visit: Payer: Self-pay | Admitting: Family Medicine

## 2013-07-06 NOTE — Telephone Encounter (Signed)
Last refill 05/31/13 #90 0 refill

## 2013-07-06 NOTE — Telephone Encounter (Signed)
Refill for 3 months. 

## 2013-08-02 ENCOUNTER — Other Ambulatory Visit: Payer: Self-pay | Admitting: Family Medicine

## 2013-08-02 NOTE — Telephone Encounter (Signed)
Last refill 07/06/13 #90 0 refill

## 2013-08-02 NOTE — Telephone Encounter (Signed)
Refill for 6 months. 

## 2013-10-23 ENCOUNTER — Other Ambulatory Visit: Payer: Self-pay | Admitting: Family Medicine

## 2013-10-30 ENCOUNTER — Other Ambulatory Visit: Payer: Self-pay | Admitting: Family Medicine

## 2013-10-31 ENCOUNTER — Telehealth: Payer: Self-pay | Admitting: Family Medicine

## 2013-10-31 NOTE — Telephone Encounter (Signed)
Pt states [facility] is stating they haven't received the re-fill request on ALPRAZolam (XANAX) 1 MG tablet. Pt said he talked to the pharmacy today.

## 2013-10-31 NOTE — Telephone Encounter (Signed)
Last visit 05/16/13 Last refill 08/02/13 #90 2 refill

## 2013-10-31 NOTE — Telephone Encounter (Signed)
RX is called into pharmacy. Informed that is should be ready soon.

## 2013-10-31 NOTE — Telephone Encounter (Signed)
Refill with 2 additional refills. 

## 2014-01-19 ENCOUNTER — Telehealth: Payer: Self-pay | Admitting: Family Medicine

## 2014-01-19 NOTE — Telephone Encounter (Signed)
Pt informed that he has not had any PSA labs recently. Pt schedule appt to talk to Dr. Elease Hashimoto.

## 2014-01-19 NOTE — Telephone Encounter (Signed)
Pt has family hx prostate CA and would like to know if he had PSA check. Please advise

## 2014-01-24 ENCOUNTER — Other Ambulatory Visit: Payer: Self-pay | Admitting: Family Medicine

## 2014-01-24 NOTE — Telephone Encounter (Signed)
Refill for 3 months.  Schedule office follow up before next refill.

## 2014-01-24 NOTE — Telephone Encounter (Signed)
Last visit 05/16/13 Last refill 10/30/13 #90 2 refill

## 2014-02-01 ENCOUNTER — Telehealth: Payer: Self-pay | Admitting: Family Medicine

## 2014-02-01 ENCOUNTER — Encounter: Payer: Self-pay | Admitting: Family Medicine

## 2014-02-01 ENCOUNTER — Ambulatory Visit (INDEPENDENT_AMBULATORY_CARE_PROVIDER_SITE_OTHER): Payer: 59 | Admitting: Family Medicine

## 2014-02-01 VITALS — BP 130/90 | HR 68 | Temp 97.4°F | Wt 255.0 lb

## 2014-02-01 DIAGNOSIS — E538 Deficiency of other specified B group vitamins: Secondary | ICD-10-CM

## 2014-02-01 DIAGNOSIS — I10 Essential (primary) hypertension: Secondary | ICD-10-CM

## 2014-02-01 DIAGNOSIS — F411 Generalized anxiety disorder: Secondary | ICD-10-CM

## 2014-02-01 DIAGNOSIS — F419 Anxiety disorder, unspecified: Secondary | ICD-10-CM

## 2014-02-01 DIAGNOSIS — Z8042 Family history of malignant neoplasm of prostate: Secondary | ICD-10-CM

## 2014-02-01 LAB — VITAMIN B12: VITAMIN B 12: 326 pg/mL (ref 211–911)

## 2014-02-01 LAB — BASIC METABOLIC PANEL
BUN: 11 mg/dL (ref 6–23)
CHLORIDE: 104 meq/L (ref 96–112)
CO2: 30 mEq/L (ref 19–32)
Calcium: 9.3 mg/dL (ref 8.4–10.5)
Creatinine, Ser: 1.2 mg/dL (ref 0.4–1.5)
GFR: 65.65 mL/min (ref >60.00)
Glucose, Bld: 84 mg/dL (ref 70–99)
POTASSIUM: 4.1 meq/L (ref 3.5–5.1)
Sodium: 142 mEq/L (ref 135–145)

## 2014-02-01 LAB — LIPID PANEL
CHOLESTEROL: 173 mg/dL (ref 0–200)
HDL: 29.6 mg/dL — AB (ref >39.00)
LDL Cholesterol: 109 mg/dL — ABNORMAL HIGH (ref 0–99)
Total CHOL/HDL Ratio: 6
Triglycerides: 172 mg/dL — ABNORMAL HIGH (ref 0.0–149.0)
VLDL: 34.4 mg/dL (ref 0.0–40.0)

## 2014-02-01 LAB — PSA: PSA: 1.9 ng/mL (ref 0.10–4.00)

## 2014-02-01 NOTE — Patient Instructions (Addendum)
Prostate-Specific Antigen The prostate-specific antigen (PSA) is a blood test. It is used to help detect early forms of prostate cancer. The test is usually used along with other tests. The test is also used to follow the course of those who already have prostate cancer or who have been treated for prostate cancer. Some factors interfere with the results of the PSA. The factors listed below will either increase or decrease the PSA levels. They are:  Prescriptions used for male baldness.  Some herbs.  Active prostate infection.  Prior instrumentation or urinary catheterization.  Ejaculation up to 2 days prior to testing.  A noncancerous enlargement of the prostate.  Inflammation of the prostate.  Active urinary tract infection. If your test results are elevated, your caregiver will discuss the results with you. Your caregiver will also let you know if more evaluation is needed. PREPARATION FOR TEST No preparation or fasting is necessary. NORMAL FINDINGS Less than 4 ng/mL or Less than 32mcg/L (SI units) Ranges for normal findings may vary among different laboratories and hospitals. You should always check with your caregiver after having lab work or other tests done to discuss the meaning of your test results and whether your values are considered within normal limits. MEANING OF TEST  A normal value means prostate cancer is less likely. The chance of having prostate cancer increases if the value is between 4 ng/mL and 10 ng/mL. However, further testing will be needed. Values above 10 ng/mL indicate that there is a much higher chance of having prostate cancer (if the above situations that raise PSA are not present). Your caregiver will go over your test results with you and discuss the importance of this test. If this value is elevated, your caregiver may recommend further testing or evaluation. OBTAINING THE TEST RESULTS It is your responsibility to obtain your test results. Ask the lab or  department performing the test when and how you will get your results. Document Released: 11/01/2004 Document Revised: 12/22/2011 Document Reviewed: 05/07/2007 [facility] Patient Information 2014 [place].   Vitamin B12 Deficiency Not having enough vitamin B12 is called a deficiency. Vitamin B12 is an important vitamin. Your body needs vitamin B12 to:   Make red blood cells.  Make DNA. This is the genetic material inside all of your cells.  Help your nerves work properly so they can carry messages from your brain to your body. CAUSES  Not eating enough foods that contain vitamin B12.  Not having enough stomach acid and digestive juices. The body needs these to absorb vitamin B12 from the food you eat.  Having certain digestive system diseases that make it hard to absorb vitamin B12. These diseases include Crohn's disease, chronic pancreatitis, and cystic fibrosis.  Having pernicious anemia, which is a condition where the body has too few red blood cells. People with this condition do not make enough of a protein called "intrinsic factor," which is needed to absorb vitamin B12.  Having a surgery in which part of the stomach or small intestine is removed.  Taking certain medicines that make it hard for the body to absorb vitamin B12. These medicines include:  Heartburn medicine (antacids and proton pump inhibitors).  A certain antibiotic medicine called neomycin, which fights infection.  Some medicines used to treat diabetes, tuberculosis, gout, and high cholesterol. RISK FACTORS Risk factors are things that make you more likely to develop a vitamin B12 deficiency. They include:  Being older than 44.  Being a vegetarian.  Being pregnant and  a vegetarian or having a poor diet.  Taking certain drugs.  Being an alcoholic. SYMPTOMS You may have a vitamin B12 deficiency with no symptoms. However, a vitamin B12 deficiency can cause health problems like anemia and nerve  damage. These health problems can lead to many possible symptoms, including:  Weakness.  Fatigue.  Loss of appetite.  Weight loss.  Numbness or tingling in your hands and feet.  Redness and burning of the tongue.  Confusion or memory problems.  Depression.  Dizziness.  Sensory problems, such as loss of taste, color blindness, and ringing in the ears.  Diarrhea or constipation.  Trouble walking. DIAGNOSIS Various types of tests can be given to help find the cause of your vitamin B12 deficiency. These tests include:  A complete blood count (CBC). This test gives your caregiver an overall picture of what makes up your blood.  A blood test to measure your B12 level.  A blood test to measure intrinsic factor.  An endoscopy. This procedure uses a thin tube with a camera on the end to look into your stomach or intestines. TREATMENT Treatment for vitamin B12 deficiency depends on what is causing it. Common options include:  Changing your eating and drinking habits, such as:  Eating more foods that contain vitamin B12.  Not drinking as much alcohol or any alcohol.  Taking vitamin B12 supplements. Your caregiver will tell you what dose is best for you.  Getting vitamin B12 injections. Some people get these a few times a week. Others get them once a month. HOME CARE INSTRUCTIONS  Take all supplements as directed by your caregiver. Follow the directions carefully.  Get any injections your caregiver prescribes. Do not miss your appointments.  Eat lots of healthy foods that contain vitamin B12. Ask your caregiver if you should work with a nutritionist. Good things to include in your diet are:  Meat.  Poultry.  Fish.  Eggs.  Fortified cereal and dairy products. This means vitamin B12 has been added to the food. Check the label on the package to be sure.  Do not abuse alcohol.  Keep all follow-up appointments. Your caregiver will need to perform blood tests to make  sure your vitamin B12 deficiency is going away. SEEK MEDICAL CARE IF:  You have any questions about your treatment.  Your symptoms come back. MAKE SURE YOU:  Understand these instructions.  Will watch your condition.  Will get help right away if you are not doing well or get worse. Document Released: 12/22/2011 Document Reviewed: 12/22/2011 [facility] Patient Information 2014 [place].  Managing Your High Blood Pressure Blood pressure is a measurement of how forceful your blood is pressing against the walls of the arteries. Arteries are muscular tubes within the circulatory system. Blood pressure does not stay the same. Blood pressure rises when you are active, excited, or nervous; and it lowers during sleep and relaxation. If the numbers measuring your blood pressure stay above normal most of the time, you are at risk for health problems. High blood pressure (hypertension) is a long-term (chronic) condition in which blood pressure is elevated. A blood pressure reading is recorded as two numbers, such as 120 over 80 (or 120/80). The first, higher number is called the systolic pressure. It is a measure of the pressure in your arteries as the heart beats. The second, lower number is called the diastolic pressure. It is a measure of the pressure in your arteries as the heart relaxes between beats.  Keeping your blood  pressure in a normal range is important to your overall health and prevention of health problems, such as heart disease and stroke. When your blood pressure is uncontrolled, your heart has to work harder than normal. High blood pressure is a very common condition in adults because blood pressure tends to rise with age. Men and women are equally likely to have hypertension but at different times in life. Before age 48, men are more likely to have hypertension. After 60 years of age, women are more likely to have it. Hypertension is especially common in African Americans. This  condition often has no signs or symptoms. The cause of the condition is usually not known. Your caregiver can help you come up with a plan to keep your blood pressure in a normal, healthy range. BLOOD PRESSURE STAGES Blood pressure is classified into four stages: normal, prehypertension, stage 1, and stage 2. Your blood pressure reading will be used to determine what type of treatment, if any, is necessary. Appropriate treatment options are tied to these four stages:  Normal  Systolic pressure (mm Hg): below 120.  Diastolic pressure (mm Hg): below 80. Prehypertension  Systolic pressure (mm Hg): 120 to 139.  Diastolic pressure (mm Hg): 80 to 89. Stage1  Systolic pressure (mm Hg): 140 to 159.  Diastolic pressure (mm Hg): 90 to 99. Stage2  Systolic pressure (mm Hg): 160 or above.  Diastolic pressure (mm Hg): 100 or above. RISKS RELATED TO HIGH BLOOD PRESSURE Managing your blood pressure is an important responsibility. Uncontrolled high blood pressure can lead to:  A heart attack.  A stroke.  A weakened blood vessel (aneurysm).  Heart failure.  Kidney damage.  Eye damage.  Metabolic syndrome.  Memory and concentration problems. HOW TO MANAGE YOUR BLOOD PRESSURE Blood pressure can be managed effectively with lifestyle changes and medicines (if needed). Your caregiver will help you come up with a plan to bring your blood pressure within a normal range. Your plan should include the following: Education  Read all information provided by your caregivers about how to control blood pressure.  Educate yourself on the latest guidelines and treatment recommendations. New research is always being done to further define the risks and treatments for high blood pressure. Lifestylechanges  Control your weight.  Avoid smoking.  Stay physically active.  Reduce the amount of salt in your diet.  Reduce stress.  Control any chronic conditions, such as high cholesterol or  diabetes.  Reduce your alcohol intake. Medicines  Several medicines (antihypertensive medicines) are available, if needed, to bring blood pressure within a normal range. Communication  Review all the medicines you take with your caregiver because there may be side effects or interactions.  Talk with your caregiver about your diet, exercise habits, and other lifestyle factors that may be contributing to high blood pressure.  See your caregiver regularly. Your caregiver can help you create and adjust your plan for managing high blood pressure. RECOMMENDATIONS FOR TREATMENT AND FOLLOW-UP  The following recommendations are based on current guidelines for managing high blood pressure in nonpregnant adults. Use these recommendations to identify the proper follow-up period or treatment option based on your blood pressure reading. You can discuss these options with your caregiver.  Systolic pressure of 371 to 062 or diastolic pressure of 80 to 89: Follow up with your caregiver as directed.  Systolic pressure of 694 to 854 or diastolic pressure of 90 to 100: Follow up with your caregiver within 2 months.  Systolic pressure above 627 or  diastolic pressure above 449: Follow up with your caregiver within 1 month.  Systolic pressure above 675 or diastolic pressure above 916: Consider antihypertensive therapy; follow up with your caregiver within 1 week.  Systolic pressure above 384 or diastolic pressure above 665: Begin antihypertensive therapy; follow up with your caregiver within 1 week. Document Released: 06/23/2012 Document Reviewed: 06/23/2012 [facility] Patient Information 2014 [place], [place].
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
## 2014-02-01 NOTE — Progress Notes (Signed)
Pre visit review using our clinic review tool, if applicable. No additional management support is needed unless otherwise documented below in the visit note. 

## 2014-02-01 NOTE — Telephone Encounter (Signed)
Relevant patient education mailed to patient.  

## 2014-02-14 ENCOUNTER — Encounter: Payer: Self-pay | Admitting: Internal Medicine

## 2014-02-14 ENCOUNTER — Telehealth: Payer: Self-pay | Admitting: Family Medicine

## 2014-02-14 NOTE — Telephone Encounter (Signed)
Patient Information:  Caller Name: Briar  Phone: [phone]  Patient: Wayne Rhodes, Wayne Rhodes  Gender: Male  DOB: [birth year]  Age: 58 Years  PCP: Carolann Littler (Family Practice)  Office Follow Up:  Does the office need to follow up with this patient?: No  Instructions For The Office: N/A  RN Note:  Triaged per [facility] Gastrointestinal Bleeding Protocol with all emergent symptoms ruled out with exception of "New change in bowel movements to black, tarry stools."  Disposition is to see provider within 4 hrs;  Appt. scheduled for 3:30;  Reviewed emergent symptoms such as chest pain, shortness of breath, faint feeling;  Advised if possible may collect stool sample to bring in.  Symptoms  Reason For Call & Symptoms: Patient currently on Augmentin rx'd by his dentist for an abscessed tooth;  He states he hasn't felt good since taking them and has noticed that over the last day or so he has been going to the bathroom every time he eats and his stools are black;  He denies loose or watery stools just and increase in frequency;  Denies faintness, chest pain, dizziness;  No bright red bleeding, no abdominal pain but he feels like he has gas, no distention in the abdomen.  Reviewed Health History In EMR: Yes  Reviewed Medications In EMR: Yes  Reviewed Allergies In EMR: Yes  Reviewed Surgeries / Procedures: Yes  Date of Onset of Symptoms: 02/13/2014  Guideline(s) Used:  Diarrhea  Rectal Bleeding  No Protocol Available - Sick Adult  Disposition Per Guideline:   Go to Office Now  Reason For Disposition Reached:   Nursing judgment  Advice Given:  N/A  Patient Will Follow Care Advice:  YES  Appointment Scheduled:  02/14/2014 15:30:00 Appointment Scheduled Provider:  Carolann Littler (Family Practice)

## 2014-02-14 NOTE — Progress Notes (Signed)
Document opened and reviewed for OV but appt  canceled same day .  

## 2014-04-17 ENCOUNTER — Encounter: Payer: Self-pay | Admitting: Family Medicine

## 2014-04-17 ENCOUNTER — Ambulatory Visit (INDEPENDENT_AMBULATORY_CARE_PROVIDER_SITE_OTHER): Payer: Medicare Other | Admitting: Family Medicine

## 2014-04-17 VITALS — BP 134/80 | HR 93 | Temp 97.8°F | Wt 261.0 lb

## 2014-04-17 DIAGNOSIS — F5232 Male orgasmic disorder: Secondary | ICD-10-CM | POA: Diagnosis not present

## 2014-04-17 MED ORDER — TADALAFIL 5 MG PO TABS: 5.0000 mg | ORAL_TABLET | Freq: Every day | ORAL | Status: DC | PRN

## 2014-04-17 NOTE — Progress Notes (Signed)
   Subjective:    Patient ID: Wayne Rhodes, male    DOB: 1956/03/18, 58 y.o.   MRN: [id number]  HPI Patient seen with complaints of anorgasmia. He has history of erectile dysfunction and has used Viagra with good success for that. He had difficulty over the weekend in achieving orgasm. He has previously been on Cialis 5 mg daily and does not recall problem with that. He does not take any serotonin reuptake inhibitors. He takes blood pressure medication and also alprazolam has been on these for many years. Libido is good.  Past Medical History  Diagnosis Date  . Hyperlipidemia   . Hypertension   . Pneumonia     had pneumonia 4-5 months ago  . Recurrent upper respiratory infection (URI)     recent chest cold - treated with mucinex and cough medicine - now improved  . GERD (gastroesophageal reflux disease)   . Anxiety     takes xanax for anxiety   Past Surgical History  Procedure Laterality Date  . Knee arthroscopy      left  . Elbow surgery      both elbow surgery  . Anterior cervical decomp/discectomy fusion  09/25/2011    Procedure: ANTERIOR CERVICAL DECOMPRESSION/DISCECTOMY FUSION 1 LEVEL;  Surgeon: Dahlia Bailiff;  Location: [place];  Service: Orthopedics;  Laterality: N/A;  ACDF Cervical 5-6    reports that he has never smoked. He does not have any smokeless tobacco history on file. He reports that he drinks about .6 ounces of alcohol per week. He reports that he does not use illicit drugs. family history includes Cancer in his father. Allergies  Allergen Reactions  . Clonazepam     Angioedema   . Quinapril     Angioedema   . Sertraline Hcl Other (See Comments)    Suicidal thoughts      Review of Systems  Constitutional: Negative for fatigue.  Eyes: Negative for visual disturbance.  Respiratory: Negative for cough, chest tightness and shortness of breath.   Cardiovascular: Negative for chest pain, palpitations and leg swelling.  Endocrine: Negative for polydipsia  and polyuria.  Neurological: Negative for dizziness, syncope, weakness, light-headedness and headaches.       Objective:   Physical Exam  Constitutional: He is oriented to person, place, and time. He appears well-developed and well-nourished.  HENT:  Right Ear: External ear normal.  Left Ear: External ear normal.  Mouth/Throat: Oropharynx is clear and moist.  Eyes: Pupils are equal, round, and reactive to light.  Neck: Neck supple. No thyromegaly present.  Cardiovascular: Normal rate and regular rhythm.   Pulmonary/Chest: Effort normal and breath sounds normal. No respiratory distress. He has no wheezes. He has no rales.  Musculoskeletal: He exhibits no edema.  Neurological: He is alert and oriented to person, place, and time.          Assessment & Plan:  Anorgasmia. He does not take any medications that would likely be contributing. No serotonin reuptake inhibitors. We'll try changing back to Cialis 5 mg daily which he thinks worked well for him in the past without similar problem.

## 2014-04-17 NOTE — Progress Notes (Signed)
Pre visit review using our clinic review tool, if applicable. No additional management support is needed unless otherwise documented below in the visit note. 

## 2014-04-19 ENCOUNTER — Other Ambulatory Visit: Payer: Self-pay | Admitting: Family Medicine

## 2014-04-19 NOTE — Telephone Encounter (Signed)
Due July 15th.Marland Kitchen  He should not be exceeding recommended use interval.

## 2014-04-19 NOTE — Telephone Encounter (Signed)
Last visit 04/17/14 Last refill 01/25/14 #90 2 refill

## 2014-04-26 ENCOUNTER — Telehealth: Payer: Self-pay | Admitting: Family Medicine

## 2014-04-26 DIAGNOSIS — M545 Low back pain, unspecified: Secondary | ICD-10-CM | POA: Diagnosis not present

## 2014-04-26 NOTE — Telephone Encounter (Signed)
Pt called back to see if his rx can be filled  ALPRAZolam (XANAX) 1 MG tablet

## 2014-04-27 NOTE — Telephone Encounter (Signed)
Rx called in to pharmacy. 

## 2014-04-28 ENCOUNTER — Other Ambulatory Visit: Payer: Self-pay | Admitting: Physical Medicine and Rehabilitation

## 2014-04-28 DIAGNOSIS — M545 Low back pain, unspecified: Secondary | ICD-10-CM

## 2014-05-06 ENCOUNTER — Other Ambulatory Visit: Payer: Medicare Other

## 2014-05-10 ENCOUNTER — Ambulatory Visit
Admission: RE | Admit: 2014-05-10 | Discharge: 2014-05-10 | Disposition: A | Discharge status: Home / Self Care | Payer: Medicare Other | Source: Ambulatory Visit | Attending: Physical Medicine and Rehabilitation | Admitting: Physical Medicine and Rehabilitation

## 2014-05-10 DIAGNOSIS — M47817 Spondylosis without myelopathy or radiculopathy, lumbosacral region: Secondary | ICD-10-CM | POA: Diagnosis not present

## 2014-05-10 DIAGNOSIS — M545 Low back pain, unspecified: Secondary | ICD-10-CM

## 2014-05-10 DIAGNOSIS — M5126 Other intervertebral disc displacement, lumbar region: Secondary | ICD-10-CM | POA: Diagnosis not present

## 2014-05-13 ENCOUNTER — Other Ambulatory Visit: Payer: Self-pay | Admitting: Family Medicine

## 2014-05-15 ENCOUNTER — Other Ambulatory Visit: Payer: Self-pay | Admitting: Family Medicine

## 2014-05-18 DIAGNOSIS — M5430 Sciatica, unspecified side: Secondary | ICD-10-CM | POA: Diagnosis not present

## 2014-05-24 ENCOUNTER — Other Ambulatory Visit: Payer: Self-pay | Admitting: Family Medicine

## 2014-05-24 NOTE — Telephone Encounter (Signed)
Refill for 3 months. 

## 2014-05-24 NOTE — Telephone Encounter (Signed)
Last visit 04/17/14 Last refill 04/26/14 #90 0 refill

## 2014-06-13 DIAGNOSIS — Z79899 Other long term (current) drug therapy: Secondary | ICD-10-CM | POA: Diagnosis not present

## 2014-06-13 DIAGNOSIS — G894 Chronic pain syndrome: Secondary | ICD-10-CM | POA: Diagnosis not present

## 2014-08-03 ENCOUNTER — Ambulatory Visit (INDEPENDENT_AMBULATORY_CARE_PROVIDER_SITE_OTHER): Payer: Medicare Other | Admitting: Family Medicine

## 2014-08-03 ENCOUNTER — Encounter: Payer: Self-pay | Admitting: Family Medicine

## 2014-08-03 VITALS — BP 130/84 | HR 66 | Temp 97.8°F | Wt 259.0 lb

## 2014-08-03 DIAGNOSIS — K219 Gastro-esophageal reflux disease without esophagitis: Secondary | ICD-10-CM | POA: Diagnosis not present

## 2014-08-03 DIAGNOSIS — J069 Acute upper respiratory infection, unspecified: Secondary | ICD-10-CM

## 2014-08-03 DIAGNOSIS — B9789 Other viral agents as the cause of diseases classified elsewhere: Secondary | ICD-10-CM

## 2014-08-03 DIAGNOSIS — L819 Disorder of pigmentation, unspecified: Secondary | ICD-10-CM

## 2014-08-03 DIAGNOSIS — I10 Essential (primary) hypertension: Secondary | ICD-10-CM | POA: Diagnosis not present

## 2014-08-03 NOTE — Progress Notes (Signed)
Pre visit review using our clinic review tool, if applicable. No additional management support is needed unless otherwise documented below in the visit note. 

## 2014-08-03 NOTE — Patient Instructions (Signed)
Gastroesophageal Reflux Disease, Adult Gastroesophageal reflux disease (GERD) happens when acid from your stomach flows up into the esophagus. When acid comes in contact with the esophagus, the acid causes soreness (inflammation) in the esophagus. Over time, GERD may create small holes (ulcers) in the lining of the esophagus. CAUSES   Increased body weight. This puts pressure on the stomach, making acid rise from the stomach into the esophagus.  Smoking. This increases acid production in the stomach.  Drinking alcohol. This causes decreased pressure in the lower esophageal sphincter (valve or ring of muscle between the esophagus and stomach), allowing acid from the stomach into the esophagus.  Late evening meals and a full stomach. This increases pressure and acid production in the stomach.  A malformed lower esophageal sphincter. Sometimes, no cause is found. SYMPTOMS   Burning pain in the lower part of the mid-chest behind the breastbone and in the mid-stomach area. This may occur twice a week or more often.  Trouble swallowing.  Sore throat.  Dry cough.  Asthma-like symptoms including chest tightness, shortness of breath, or wheezing. DIAGNOSIS  Your caregiver may be able to diagnose GERD based on your symptoms. In some cases, X-rays and other tests may be done to check for complications or to check the condition of your stomach and esophagus. TREATMENT  Your caregiver may recommend over-the-counter or prescription medicines to help decrease acid production. Ask your caregiver before starting or adding any new medicines.  HOME CARE INSTRUCTIONS   Change the factors that you can control. Ask your caregiver for guidance concerning weight loss, quitting smoking, and alcohol consumption.  Avoid foods and drinks that make your symptoms worse, such as:  Caffeine or alcoholic drinks.  Chocolate.  Peppermint or mint flavorings.  Garlic and onions.  Spicy foods.  Citrus fruits,  such as oranges, lemons, or limes.  Tomato-based foods such as sauce, chili, salsa, and pizza.  Fried and fatty foods.  Avoid lying down for the 3 hours prior to your bedtime or prior to taking a nap.  Eat small, frequent meals instead of large meals.  Wear loose-fitting clothing. Do not wear anything tight around your waist that causes pressure on your stomach.  Raise the head of your bed 6 to 8 inches with wood blocks to help you sleep. Extra pillows will not help.  Only take over-the-counter or prescription medicines for pain, discomfort, or fever as directed by your caregiver.  Do not take aspirin, ibuprofen, or other nonsteroidal anti-inflammatory drugs (NSAIDs). SEEK IMMEDIATE MEDICAL CARE IF:   You have pain in your arms, neck, jaw, teeth, or back.  Your pain increases or changes in intensity or duration.  You develop nausea, vomiting, or sweating (diaphoresis).  You develop shortness of breath, or you faint.  Your vomit is green, yellow, black, or looks like coffee grounds or blood.  Your stool is red, bloody, or black. These symptoms could be signs of other problems, such as heart disease, gastric bleeding, or esophageal bleeding. MAKE SURE YOU:   Understand these instructions.  Will watch your condition.  Will get help right away if you are not doing well or get worse. Document Released: 07/09/2005 Document Revised: 12/22/2011 Document Reviewed: 04/18/2011 ExitCare Patient Information 2015 [employer]. This information is not intended to replace advice given to you by your health care provider. Make sure you discuss any questions you have with your health care provider.  

## 2014-08-03 NOTE — Progress Notes (Signed)
Subjective:    Patient ID: Wayne Rhodes, male    DOB: 1955/11/11, 58 y.o.   MRN: [id number]  HPI 58 year old male presents today for a 6 month follow up.  He states he has been having cough and congestion as well as more reflux recently.  The cough is nonproductive.  Denies headaches, rhinorrhea, postnasal drip, otalgia, fevers, SOB, chest pain or myalgias. Denies smoking.  Reflux has been happening more often and especially when drinking soda or eating pizza.  He tried zantac but gets no relief.  Denies vomiting, belching and chest pain.  PMH pertinent for HTN and dyslipidemia.  He takes  Diltiazem and HCTZ for BP.  He wishes to only take one medication for BP.  He takes no medication for dyslipidemia, but wishes to just watch diet and exercise.  He refuses a flu shot today.  He is up to date with colonoscopy.    Review of Systems  Constitutional: Negative for fever, activity change, appetite change and fatigue.  HENT: Positive for congestion, dental problem and sore throat. Negative for ear discharge, ear pain, facial swelling, postnasal drip, rhinorrhea, sinus pressure and sneezing.        Recently had a dental procedure to remove a tooth.  He is wearing a "flapper" until January when the replacement tooth is implanted.  Denies medications for the oral procedure to cause a sore throat; was only placed on pain medication.  Eyes: Negative for pain and visual disturbance.  Respiratory: Positive for cough. Negative for chest tightness, shortness of breath and wheezing.        Non-productive.  Occasionally coughing up colorless sputum.  Cardiovascular: Negative for chest pain and palpitations.  Gastrointestinal: Negative for nausea, vomiting, abdominal pain, diarrhea and constipation.  Endocrine: Negative.   Musculoskeletal:       Chronic back pain.  He has had an MRI completed in the past.  Neurological: Negative for dizziness, weakness, numbness and headaches.   Past Medical History    Diagnosis Date  . Hyperlipidemia   . Hypertension   . Pneumonia     had pneumonia 4-5 months ago  . Recurrent upper respiratory infection (URI)     recent chest cold - treated with mucinex and cough medicine - now improved  . GERD (gastroesophageal reflux disease)   . Anxiety     takes xanax for anxiety   Past Surgical History  Procedure Laterality Date  . Knee arthroscopy      left  . Elbow surgery      both elbow surgery  . Anterior cervical decomp/discectomy fusion  09/25/2011    Procedure: ANTERIOR CERVICAL DECOMPRESSION/DISCECTOMY FUSION 1 LEVEL;  Surgeon: Dahlia Bailiff;  Location: [place];  Service: Orthopedics;  Laterality: N/A;  ACDF Cervical 5-6   Current Outpatient Prescriptions on File Prior to Visit  Medication Sig Dispense Refill  . ALPRAZolam (XANAX) 1 MG tablet TAKE 1 TABLET BY MOUTH 3 TIMES A DAY AS NEEDED  90 tablet  2  . diltiazem (CARDIZEM CD) 180 MG 24 hr capsule TAKE 1 CAPSULE BY MOUTH DAILY  90 capsule  1  . EPINEPHrine (EPI-PEN) 0.3 mg/0.3 mL DEVI Inject 0.3 mg into the muscle as needed. For anaphylaxis      . hydrochlorothiazide (MICROZIDE) 12.5 MG capsule TAKE ONE CAPSULE BY MOUTH DAILY  30 capsule  5  . tadalafil (CIALIS) 5 MG tablet Take 1 tablet (5 mg total) by mouth daily as needed for erectile dysfunction.  30 tablet  11  . [DISCONTINUED] buPROPion (WELLBUTRIN XL) 150 MG 24 hr tablet Take 150 mg by mouth daily.      . [DISCONTINUED] diphenhydrAMINE (BENADRYL) 25 MG tablet Take 25 mg by mouth 2 (two) times daily as needed. For swelling       No current facility-administered medications on file prior to visit.   Allergies  Allergen Reactions  . Clonazepam     Angioedema   . Quinapril     Angioedema   . Sertraline Hcl Other (See Comments)    Suicidal thoughts   Family History  Problem Relation Age of Onset  . Cancer Father     prostate    History   Social History  . Marital Status: Single    Spouse Name: N/A    Number of Children: N/A   . Years of Education: N/A   Occupational History  . Not on file.   Social History Main Topics  . Smoking status: Never Smoker   . Smokeless tobacco: Not on file  . Alcohol Use: 0.6 oz/week    1 Cans of beer per week     Comment: occasional beer  . Drug Use: No  . Sexual Activity: Yes   Other Topics Concern  . Not on file   Social History Narrative  . No narrative on file      Objective:   Physical Exam  Nursing note and vitals reviewed. Constitutional: He is oriented to person, place, and time. He appears well-developed and well-nourished. No distress.  HENT:  Head: Normocephalic and atraumatic.  Right Ear: External ear normal.  Left Ear: External ear normal.  Nose: Nose normal.  Mouth/Throat: Oropharynx is clear and moist. No oropharyngeal exudate.  Eyes: Conjunctivae and EOM are normal.  Neck: Normal range of motion. Neck supple. No tracheal deviation present. No thyromegaly present.  Cardiovascular: Normal rate, regular rhythm and normal heart sounds.  Exam reveals no gallop and no friction rub.   No murmur heard. Pulmonary/Chest: Effort normal and breath sounds normal. No respiratory distress. He has no wheezes. He has no rales.  Lymphadenopathy:    He has no cervical adenopathy.  Neurological: He is alert and oriented to person, place, and time.  Skin: Skin is warm and dry. He is not diaphoretic. No erythema.  Uniformly light brown hyperpigmented macule on tip of nose x 6 months.  Not raised.  Slight irregular border.          Assessment & Plan:  1. HTN- controlled at 130/ 84 on diltiazem and HCTZ.  Recommend not to decrease to one medication.  He is not yet well controlled, and if a medication is removed he may become more hypertensive.  Patient agreed to try lifestyle modifications including diet and exercise.  If he can make improvements in his lifestyle, he agrees to decrease medications then.  Patient was advised that he should decrease the amount of soda  and sweet tea that he consumes.    2. GERD- continue to use zantac.  If that continues to have little relief, he can try OTC prilosec or nexium.  He was advised to decrease his diet of fatty pizza and soda.   3. Solar lentigo- Differential to include pigmented actinic keratosis and lentigo maligna.  Patient states that this spot is new in the last 6-8 months.  He has seen dermatology in the past, and is willing to make an appointment for a second opinion.  Dermatology can perform a biopsy if necessary.  4. Viral  URI- patient was assured that the cough should resolve in a few weeks.  If it persists, he will return to clinic.  He will try OTC remedies such as mucinex, claritin or allegra for symptom relief.      Joseph Art PA-S  As above.  Macular pigmented skin lesion suspected solar lentigo.  He has established relationship with dermatologist and pt assures [place] he will set up follow up.   We have recommended that he schedule CPE later this year.  Carolann Littler, MD

## 2014-08-11 DIAGNOSIS — D485 Neoplasm of uncertain behavior of skin: Secondary | ICD-10-CM | POA: Diagnosis not present

## 2014-08-11 DIAGNOSIS — L0100 Impetigo, unspecified: Secondary | ICD-10-CM | POA: Diagnosis not present

## 2014-08-17 ENCOUNTER — Telehealth: Payer: Self-pay | Admitting: Family Medicine

## 2014-08-17 NOTE — Telephone Encounter (Signed)
Last visit 08/03/14 Last refill 05/24/14 #90 2 refill

## 2014-08-17 NOTE — Telephone Encounter (Signed)
Not due until Nov 12.

## 2014-08-18 ENCOUNTER — Telehealth: Payer: Self-pay | Admitting: Family Medicine

## 2014-08-18 NOTE — Telephone Encounter (Signed)
Pt request refill of the following: ALPRAZolam (XANAX) 1 MG tablet   Pt said he got the above rx filled on 08/18/14 and that he is about to be out of his med.    Phamacy:

## 2014-08-21 NOTE — Telephone Encounter (Signed)
Pt aware not due till 11/12

## 2014-08-21 NOTE — Telephone Encounter (Signed)
Last visit 08/03/14 Last refill 05/24/14 #90 2 refill

## 2014-08-23 DIAGNOSIS — M5126 Other intervertebral disc displacement, lumbar region: Secondary | ICD-10-CM | POA: Diagnosis not present

## 2014-09-11 DIAGNOSIS — M47816 Spondylosis without myelopathy or radiculopathy, lumbar region: Secondary | ICD-10-CM | POA: Diagnosis not present

## 2014-09-11 DIAGNOSIS — G894 Chronic pain syndrome: Secondary | ICD-10-CM | POA: Diagnosis not present

## 2014-09-11 DIAGNOSIS — Z79891 Long term (current) use of opiate analgesic: Secondary | ICD-10-CM | POA: Diagnosis not present

## 2014-09-21 ENCOUNTER — Other Ambulatory Visit: Payer: Self-pay | Admitting: Family Medicine

## 2014-09-21 NOTE — Telephone Encounter (Signed)
Last visit 08/03/14 Last refill 08/24/14 #90 0 refill

## 2014-09-22 NOTE — Telephone Encounter (Signed)
Refill for 3 months. 

## 2014-10-29 ENCOUNTER — Other Ambulatory Visit: Payer: Self-pay | Admitting: Family Medicine

## 2014-11-05 ENCOUNTER — Other Ambulatory Visit: Payer: Self-pay | Admitting: Family Medicine

## 2014-11-14 ENCOUNTER — Telehealth: Payer: Self-pay | Admitting: Family Medicine

## 2014-11-14 NOTE — Telephone Encounter (Signed)
Let pt know.  He can take this up with his insurance.

## 2014-11-14 NOTE — Telephone Encounter (Signed)
PA for Cialis was denied.  Medications for sexual dysfunction are excluded from patient's plan.

## 2014-11-15 NOTE — Telephone Encounter (Signed)
Left message for patient to return call.

## 2014-12-15 ENCOUNTER — Other Ambulatory Visit: Payer: Self-pay | Admitting: Family Medicine

## 2014-12-15 NOTE — Telephone Encounter (Signed)
Last visit 08/03/14 Last refill 09/22/14 #90 2 refill

## 2014-12-17 NOTE — Telephone Encounter (Signed)
May refill by March 10 for 3 months

## 2014-12-19 NOTE — Telephone Encounter (Signed)
Pt is aware that Rx will be called in on 12/21/14

## 2014-12-20 DIAGNOSIS — M47816 Spondylosis without myelopathy or radiculopathy, lumbar region: Secondary | ICD-10-CM | POA: Diagnosis not present

## 2014-12-20 DIAGNOSIS — M545 Low back pain: Secondary | ICD-10-CM | POA: Diagnosis not present

## 2015-01-03 ENCOUNTER — Ambulatory Visit (INDEPENDENT_AMBULATORY_CARE_PROVIDER_SITE_OTHER): Payer: Medicare Other | Admitting: Family Medicine

## 2015-01-03 ENCOUNTER — Encounter: Payer: Self-pay | Admitting: Family Medicine

## 2015-01-03 VITALS — BP 128/86 | HR 82 | Temp 97.5°F | Wt 254.0 lb

## 2015-01-03 DIAGNOSIS — Z8042 Family history of malignant neoplasm of prostate: Secondary | ICD-10-CM

## 2015-01-03 DIAGNOSIS — J069 Acute upper respiratory infection, unspecified: Secondary | ICD-10-CM | POA: Diagnosis not present

## 2015-01-03 DIAGNOSIS — E785 Hyperlipidemia, unspecified: Secondary | ICD-10-CM | POA: Diagnosis not present

## 2015-01-03 DIAGNOSIS — B9789 Other viral agents as the cause of diseases classified elsewhere: Secondary | ICD-10-CM

## 2015-01-03 DIAGNOSIS — N528 Other male erectile dysfunction: Secondary | ICD-10-CM

## 2015-01-03 DIAGNOSIS — M47816 Spondylosis without myelopathy or radiculopathy, lumbar region: Secondary | ICD-10-CM | POA: Diagnosis not present

## 2015-01-03 DIAGNOSIS — M5441 Lumbago with sciatica, right side: Secondary | ICD-10-CM | POA: Diagnosis not present

## 2015-01-03 DIAGNOSIS — Z79891 Long term (current) use of opiate analgesic: Secondary | ICD-10-CM | POA: Diagnosis not present

## 2015-01-03 DIAGNOSIS — G894 Chronic pain syndrome: Secondary | ICD-10-CM | POA: Diagnosis not present

## 2015-01-03 DIAGNOSIS — I10 Essential (primary) hypertension: Secondary | ICD-10-CM

## 2015-01-03 LAB — BASIC METABOLIC PANEL
BUN: 18 mg/dL (ref 6–23)
CALCIUM: 9.3 mg/dL (ref 8.4–10.5)
CO2: 30 mEq/L (ref 19–32)
CREATININE: 1.18 mg/dL (ref 0.40–1.50)
Chloride: 101 mEq/L (ref 96–112)
GFR: 66.73 mL/min (ref >60.00)
Glucose, Bld: 106 mg/dL — ABNORMAL HIGH (ref 70–99)
Potassium: 4.6 mEq/L (ref 3.5–5.1)
SODIUM: 139 meq/L (ref 135–145)

## 2015-01-03 LAB — LIPID PANEL
CHOL/HDL RATIO: 7
Cholesterol: 206 mg/dL — ABNORMAL HIGH (ref 0–200)
HDL: 31.4 mg/dL — ABNORMAL LOW (ref >39.00)
LDL Cholesterol: 144 mg/dL — ABNORMAL HIGH (ref 0–99)
NONHDL: 174.6
Triglycerides: 155 mg/dL — ABNORMAL HIGH (ref 0.0–149.0)
VLDL: 31 mg/dL (ref 0.0–40.0)

## 2015-01-03 LAB — PSA: PSA: 1.28 ng/mL (ref 0.10–4.00)

## 2015-01-03 MED ORDER — TADALAFIL 5 MG PO TABS: 5.0000 mg | ORAL_TABLET | Freq: Every day | ORAL | Status: DC | PRN

## 2015-01-03 NOTE — Progress Notes (Signed)
Pre visit review using our clinic review tool, if applicable. No additional management support is needed unless otherwise documented below in the visit note. 

## 2015-01-03 NOTE — Progress Notes (Signed)
   Subjective:    Patient ID: Wayne Rhodes, male    DOB: March 28, 1956, 59 y.o.   MRN: [id number]  HPI  Patient here for several issues as follows  Typical cold-like symptoms about 2 weeks ago. Rhinorrhea and congestion. No fever. Mild headache. Mild sinus pressure. Symptoms gradually improving. Still has occasional nonproductive cough.  Erectile dysfunction. Has taken Cialis. Has cost issues. Requesting refill. No history of BPH. Works well for him.  Hypertension treated with diltiazem and HCTZ. Compliant with therapy. Blood pressure well controlled. No headaches or dizziness. No chest pains.  History of dyslipidemia. Slightly elevated triglycerides and low HDL. Patient also has family history of prostate cancer in her brother. He has not had any obstructive urinary symptoms recently. PSA year ago 1.9.  Past Medical History  Diagnosis Date  . Hyperlipidemia   . Hypertension   . Pneumonia     had pneumonia 4-5 months ago  . Recurrent upper respiratory infection (URI)     recent chest cold - treated with mucinex and cough medicine - now improved  . GERD (gastroesophageal reflux disease)   . Anxiety     takes xanax for anxiety   Past Surgical History  Procedure Laterality Date  . Knee arthroscopy      left  . Elbow surgery      both elbow surgery  . Anterior cervical decomp/discectomy fusion  09/25/2011    Procedure: ANTERIOR CERVICAL DECOMPRESSION/DISCECTOMY FUSION 1 LEVEL;  Surgeon: Dahlia Bailiff;  Location: [place];  Service: Orthopedics;  Laterality: N/A;  ACDF Cervical 5-6    reports that he has never smoked. He does not have any smokeless tobacco history on file. He reports that he drinks about 0.6 oz of alcohol per week. He reports that he does not use illicit drugs. family history includes Cancer in his father. Allergies  Allergen Reactions  . Clonazepam     Angioedema   . Quinapril     Angioedema   . Sertraline Hcl Other (See Comments)    Suicidal thoughts     Review of Systems  Constitutional: Negative for fever, chills and fatigue.  HENT: Positive for congestion.   Eyes: Negative for visual disturbance.  Respiratory: Positive for cough. Negative for chest tightness and shortness of breath.   Cardiovascular: Negative for chest pain, palpitations and leg swelling.  Gastrointestinal: Negative for abdominal pain.  Endocrine: Negative for polydipsia and polyuria.  Musculoskeletal: Positive for back pain.  Neurological: Negative for dizziness, syncope, weakness, light-headedness and headaches.       Objective:   Physical Exam  Constitutional: He appears well-developed and well-nourished.  HENT:  Right Ear: External ear normal.  Left Ear: External ear normal.  Mouth/Throat: Oropharynx is clear and moist.  Neck: Neck supple. No thyromegaly present.  Cardiovascular: Normal rate and regular rhythm.   Pulmonary/Chest: Effort normal and breath sounds normal. No respiratory distress. He has no wheezes. He has no rales.  Musculoskeletal: He exhibits no edema.  Lymphadenopathy:    He has no cervical adenopathy.          Assessment & Plan:  #1 viral URI with cough. Non-focal exam. Reassurance. #2 erectile dysfunction. Prescription for Cialis 5 mg daily as needed. #3 hypertension stable and well controlled. Check basic metabolic panel #4 history of dyslipidemia. Check lipid panel #5 family history of prostate cancer. Recheck PSA.

## 2015-01-04 ENCOUNTER — Telehealth: Payer: Self-pay | Admitting: Family Medicine

## 2015-01-04 NOTE — Telephone Encounter (Signed)
emmi mailed  °

## 2015-01-22 ENCOUNTER — Telehealth: Payer: Self-pay | Admitting: Family Medicine

## 2015-01-22 NOTE — Telephone Encounter (Signed)
Pt needs prior on tadalafil (CIALIS) 5 MG tablet Pt spoke w/ [facility] and they told him they would approve if dr office will call. [phone]  [facility]/ [place]

## 2015-01-23 NOTE — Telephone Encounter (Signed)
PA for this request was submitted on 11/13/14 and again today, 01/23/15. It was denied both times stating medications for treatment of sexual dysfunction are PLAN EXCLUSIONS.

## 2015-01-23 NOTE — Telephone Encounter (Signed)
PA has been submitted. #[id number]

## 2015-01-23 NOTE — Telephone Encounter (Signed)
Called to notify patient of the denial.  He verbalized understanding and hung the phone up on me.

## 2015-02-19 ENCOUNTER — Telehealth: Payer: Self-pay | Admitting: Family Medicine

## 2015-02-19 MED ORDER — SILDENAFIL CITRATE 100 MG PO TABS: 100.0000 mg | ORAL_TABLET | Freq: Every day | ORAL | Status: DC | PRN

## 2015-02-19 NOTE — Telephone Encounter (Signed)
Rx sent to pharmacy   

## 2015-02-19 NOTE — Telephone Encounter (Signed)
Pt states tadalafil (CIALIS) 5 MG tablet no longer covered by his insurance and would like [facility] [address]

## 2015-02-19 NOTE — Telephone Encounter (Signed)
viagra 100 mg one daily as needed.  #6 with prn refills.

## 2015-02-21 ENCOUNTER — Telehealth: Payer: Self-pay | Admitting: Family Medicine

## 2015-02-21 NOTE — Telephone Encounter (Signed)
Pt call to say the following meds are to expensive and his insurance will not pay sildenafil (VIAGRA) 100 MG tablet , CIALIS   Pt is asking if there is something else he can try

## 2015-02-21 NOTE — Telephone Encounter (Signed)
The only thing that MIGHT be less expensive is generic Sildenafil 20 mg take 2 to 5 tablets once daily prn sexual activity #30 with 3 refills.

## 2015-02-22 MED ORDER — SILDENAFIL CITRATE 20 MG PO TABS: ORAL_TABLET | ORAL | Status: DC

## 2015-02-22 NOTE — Telephone Encounter (Signed)
Rx sent to pharmacy   

## 2015-03-01 ENCOUNTER — Telehealth: Payer: Self-pay | Admitting: Family Medicine

## 2015-03-01 NOTE — Telephone Encounter (Signed)
[facility] denied the Rx for Sildenafil. Medicare does not cover drugs used for the treatment of erectile dysfunction.

## 2015-03-01 NOTE — Telephone Encounter (Signed)
Let pt know

## 2015-03-01 NOTE — Telephone Encounter (Signed)
Pt informed

## 2015-03-17 ENCOUNTER — Other Ambulatory Visit: Payer: Self-pay | Admitting: Family Medicine

## 2015-03-19 NOTE — Telephone Encounter (Signed)
Last visit 01/03/15 Last refill 12/21/14 #90 2 refill

## 2015-03-19 NOTE — Telephone Encounter (Signed)
May refill by June 9th with 2 additional refills.

## 2015-03-21 ENCOUNTER — Ambulatory Visit (INDEPENDENT_AMBULATORY_CARE_PROVIDER_SITE_OTHER): Payer: Medicare Other | Admitting: Family Medicine

## 2015-03-21 ENCOUNTER — Encounter: Payer: Self-pay | Admitting: Family Medicine

## 2015-03-21 VITALS — BP 130/90 | HR 80 | Temp 98.0°F | Wt 251.0 lb

## 2015-03-21 DIAGNOSIS — F419 Anxiety disorder, unspecified: Secondary | ICD-10-CM | POA: Diagnosis not present

## 2015-03-21 DIAGNOSIS — I10 Essential (primary) hypertension: Secondary | ICD-10-CM

## 2015-03-21 DIAGNOSIS — N529 Male erectile dysfunction, unspecified: Secondary | ICD-10-CM

## 2015-03-21 MED ORDER — ALPRAZOLAM 1 MG PO TABS: 1.0000 mg | ORAL_TABLET | Freq: Three times a day (TID) | ORAL | Status: DC | PRN

## 2015-03-21 NOTE — Progress Notes (Signed)
Pre visit review using our clinic review tool, if applicable. No additional management support is needed unless otherwise documented below in the visit note. 

## 2015-03-21 NOTE — Progress Notes (Signed)
   Subjective:    Patient ID: Wayne Rhodes, male    DOB: December 15, 1955, 59 y.o.   MRN: [id number]  HPI Patient here to discuss medications. He has history of hypertension. Previously had angioedema on ACE inhibitor. Currently on or thiazide and diltiazem and blood pressure relatively stable. Patient is concerned because of erectile dysfunction issues and is concerned his blood pressure medications may be contributing. We explained that he is not a good candidate for angiotensin receptor blocker because his angioedema with ACE inhibitor. We explained that basically any other classes of blood pressure medications could be associated with erectile dysfunction.  Long-standing history of anxiety. Had tried multiple anti-anxiety medications including SSRIs but had intolerance. He takes alprazolam and requesting refills.  Past Medical History  Diagnosis Date  . Hyperlipidemia   . Hypertension   . Pneumonia     had pneumonia 4-5 months ago  . Recurrent upper respiratory infection (URI)     recent chest cold - treated with mucinex and cough medicine - now improved  . GERD (gastroesophageal reflux disease)   . Anxiety     takes xanax for anxiety   Past Surgical History  Procedure Laterality Date  . Knee arthroscopy      left  . Elbow surgery      both elbow surgery  . Anterior cervical decomp/discectomy fusion  09/25/2011    Procedure: ANTERIOR CERVICAL DECOMPRESSION/DISCECTOMY FUSION 1 LEVEL;  Surgeon: Dahlia Bailiff;  Location: [facility];  Service: Orthopedics;  Laterality: N/A;  ACDF Cervical 5-6    reports that he has never smoked. He does not have any smokeless tobacco history on file. He reports that he drinks about 0.6 oz of alcohol per week. He reports that he does not use illicit drugs. family history includes Cancer in his father. Allergies  Allergen Reactions  . Clonazepam     Angioedema   . Quinapril     Angioedema   . Sertraline Hcl Other (See Comments)    Suicidal thoughts       Review of Systems  Constitutional: Negative for fatigue.  Eyes: Negative for visual disturbance.  Respiratory: Negative for cough, chest tightness and shortness of breath.   Cardiovascular: Negative for chest pain, palpitations and leg swelling.  Neurological: Negative for dizziness, syncope, weakness, light-headedness and headaches.       Objective:   Physical Exam  Constitutional: He is oriented to person, place, and time. He appears well-developed and well-nourished.  HENT:  Right Ear: External ear normal.  Left Ear: External ear normal.  Mouth/Throat: Oropharynx is clear and moist.  Eyes: Pupils are equal, round, and reactive to light.  Neck: Neck supple. No thyromegaly present.  Cardiovascular: Normal rate and regular rhythm.   Pulmonary/Chest: Effort normal and breath sounds normal. No respiratory distress. He has no wheezes. He has no rales.  Musculoskeletal: He exhibits no edema.  Neurological: He is alert and oriented to person, place, and time.          Assessment & Plan:  #1 hypertension. Fair control. Recommend weight loss. He is not a candidate for angiotensin receptor blocker because of previous angioedema with ACE inhibitor. He is interested in making some possible blood pressure medicine changes and we explained that changing to any other classes would also have risk of ED #2 chronic anxiety. Refill alprazolam for 3 months #3 erectile dysfunction. Coupon for Viagra given.

## 2015-04-21 ENCOUNTER — Other Ambulatory Visit: Payer: Self-pay | Admitting: Family Medicine

## 2015-05-02 ENCOUNTER — Other Ambulatory Visit: Payer: Self-pay | Admitting: Family Medicine

## 2015-05-02 DIAGNOSIS — M545 Low back pain: Secondary | ICD-10-CM | POA: Diagnosis not present

## 2015-05-02 DIAGNOSIS — M47816 Spondylosis without myelopathy or radiculopathy, lumbar region: Secondary | ICD-10-CM | POA: Diagnosis not present

## 2015-05-21 DIAGNOSIS — G894 Chronic pain syndrome: Secondary | ICD-10-CM | POA: Diagnosis not present

## 2015-05-21 DIAGNOSIS — Z79899 Other long term (current) drug therapy: Secondary | ICD-10-CM | POA: Diagnosis not present

## 2015-05-21 DIAGNOSIS — M47816 Spondylosis without myelopathy or radiculopathy, lumbar region: Secondary | ICD-10-CM | POA: Diagnosis not present

## 2015-05-21 DIAGNOSIS — M5441 Lumbago with sciatica, right side: Secondary | ICD-10-CM | POA: Diagnosis not present

## 2015-05-21 DIAGNOSIS — Z79891 Long term (current) use of opiate analgesic: Secondary | ICD-10-CM | POA: Diagnosis not present

## 2015-06-20 ENCOUNTER — Other Ambulatory Visit: Payer: Self-pay | Admitting: Family Medicine

## 2015-06-20 NOTE — Telephone Encounter (Signed)
Last visit 03/21/15 Last refill 03/21/15 #90 2 refill

## 2015-06-20 NOTE — Telephone Encounter (Signed)
Refill for 3 months. 

## 2015-07-30 ENCOUNTER — Encounter: Payer: Self-pay | Admitting: Family Medicine

## 2015-07-30 ENCOUNTER — Ambulatory Visit (INDEPENDENT_AMBULATORY_CARE_PROVIDER_SITE_OTHER): Payer: Medicare Other | Admitting: Family Medicine

## 2015-07-30 VITALS — BP 120/90 | HR 81 | Temp 97.9°F | Wt 255.7 lb

## 2015-07-30 DIAGNOSIS — K219 Gastro-esophageal reflux disease without esophagitis: Secondary | ICD-10-CM

## 2015-07-30 MED ORDER — PANTOPRAZOLE SODIUM 40 MG PO TBEC: 40.0000 mg | DELAYED_RELEASE_TABLET | Freq: Every day | ORAL | Status: DC

## 2015-07-30 NOTE — Patient Instructions (Signed)
Food Choices for Gastroesophageal Reflux Disease, Adult When you have gastroesophageal reflux disease (GERD), the foods you eat and your eating habits are very important. Choosing the right foods can help ease the discomfort of GERD. WHAT GENERAL GUIDELINES DO I NEED TO FOLLOW?  Choose fruits, vegetables, whole grains, low-fat dairy products, and low-fat meat, fish, and poultry.  Limit fats such as oils, salad dressings, butter, nuts, and avocado.  Keep a food diary to identify foods that cause symptoms.  Avoid foods that cause reflux. These may be different for different people.  Eat frequent small meals instead of three large meals each day.  Eat your meals slowly, in a relaxed setting.  Limit fried foods.  Cook foods using methods other than frying.  Avoid drinking alcohol.  Avoid drinking large amounts of liquids with your meals.  Avoid bending over or lying down until 2-3 hours after eating. WHAT FOODS ARE NOT RECOMMENDED? The following are some foods and drinks that may worsen your symptoms: Vegetables Tomatoes. Tomato juice. Tomato and spaghetti sauce. Chili peppers. Onion and garlic. Horseradish. Fruits Oranges, grapefruit, and lemon (fruit and juice). Meats High-fat meats, fish, and poultry. This includes hot dogs, ribs, ham, sausage, salami, and bacon. Dairy Whole milk and chocolate milk. Sour cream. Cream. Butter. Ice cream. Cream cheese.  Beverages Coffee and tea, with or without caffeine. Carbonated beverages or energy drinks. Condiments Hot sauce. Barbecue sauce.  Sweets/Desserts Chocolate and cocoa. Donuts. Peppermint and spearmint. Fats and Oils High-fat foods, including [place] fries and potato chips. Other Vinegar. Strong spices, such as black pepper, white pepper, red pepper, cayenne, curry powder, cloves, ginger, and chili powder. The items listed above may not be a complete list of foods and beverages to avoid. Contact your dietitian for more  information.   This information is not intended to replace advice given to you by your health care provider. Make sure you discuss any questions you have with your health care provider.   Document Released: 09/29/2005 Document Revised: 10/20/2014 Document Reviewed: 08/03/2013 Elsevier Interactive Patient Education 2016 [employer].  Avoid eating within 2-3 hours of bedtime Consider elevate head of bed 6-8 inches.

## 2015-07-30 NOTE — Progress Notes (Signed)
   Subjective:    Patient ID: Wayne Rhodes, male    DOB: 16-Mar-1956, 59 y.o.   MRN: [id number]  HPI Patient seen with heartburn symptoms for the past few weeks. He has always daily symptoms of epigastric burning worsened by certain foods such as fatty foods. He's tried over-the-counter Zantac which is helped slightly but still has frequent symptoms. He drinks lots of tea. No alcohol use. Nonsmoker. He has good appetite with no recent weight change. No dysphagia. Frequently eats right before going to bed.  Past Medical History  Diagnosis Date  . Hyperlipidemia   . Hypertension   . Pneumonia     had pneumonia 4-5 months ago  . Recurrent upper respiratory infection (URI)     recent chest cold - treated with mucinex and cough medicine - now improved  . GERD (gastroesophageal reflux disease)   . Anxiety     takes xanax for anxiety   Past Surgical History  Procedure Laterality Date  . Knee arthroscopy      left  . Elbow surgery      both elbow surgery  . Anterior cervical decomp/discectomy fusion  09/25/2011    Procedure: ANTERIOR CERVICAL DECOMPRESSION/DISCECTOMY FUSION 1 LEVEL;  Surgeon: Dahlia Bailiff;  Location: [facility];  Service: Orthopedics;  Laterality: N/A;  ACDF Cervical 5-6    reports that he has never smoked. He does not have any smokeless tobacco history on file. He reports that he drinks about 0.6 oz of alcohol per week. He reports that he does not use illicit drugs. family history includes Cancer in his father. Allergies  Allergen Reactions  . Clonazepam     Angioedema   . Quinapril     Angioedema   . Sertraline Hcl Other (See Comments)    Suicidal thoughts      Review of Systems  Constitutional: Negative for appetite change and unexpected weight change.  Respiratory: Negative for shortness of breath.   Cardiovascular: Negative for chest pain.  Gastrointestinal: Positive for abdominal pain. Negative for nausea, vomiting, diarrhea, constipation, blood in stool  and abdominal distention.       Objective:   Physical Exam  Constitutional: He appears well-developed and well-nourished.  Cardiovascular: Normal rate and regular rhythm.   Pulmonary/Chest: Effort normal and breath sounds normal. No respiratory distress. He has no wheezes. He has no rales.  Abdominal: Soft. Bowel sounds are normal. He exhibits no distension and no mass. There is no tenderness. There is no rebound and no guarding.          Assessment & Plan:  GERD. He does not have any red flag symptoms such as appetite or weight changes. He's tried Zantac without much improvement. Gradually reduce caffeine intake. Dietary factors discussed. Consider elevate head of bed 6-8 inches. Avoid eating within 2-3 hours of bedtime. Start Protonix 40 mg once daily. Touch base couple weeks if symptoms not improving

## 2015-07-30 NOTE — Progress Notes (Signed)
Pre visit review using our clinic review tool, if applicable. No additional management support is needed unless otherwise documented below in the visit note. 

## 2015-09-15 ENCOUNTER — Other Ambulatory Visit: Payer: Self-pay | Admitting: Family Medicine

## 2015-09-17 ENCOUNTER — Telehealth: Payer: Self-pay | Admitting: Family Medicine

## 2015-09-17 NOTE — Telephone Encounter (Signed)
Pt needs new refill on alprazolam. [facility] [address]

## 2015-09-18 DIAGNOSIS — G894 Chronic pain syndrome: Secondary | ICD-10-CM | POA: Diagnosis not present

## 2015-09-18 DIAGNOSIS — M5441 Lumbago with sciatica, right side: Secondary | ICD-10-CM | POA: Diagnosis not present

## 2015-09-18 DIAGNOSIS — G8929 Other chronic pain: Secondary | ICD-10-CM | POA: Diagnosis not present

## 2015-09-18 DIAGNOSIS — M47816 Spondylosis without myelopathy or radiculopathy, lumbar region: Secondary | ICD-10-CM | POA: Diagnosis not present

## 2015-09-18 NOTE — Telephone Encounter (Signed)
Medication: Alprazolam (Xanax) 1 mg # 90 with 2 refills  Last seen: 07/30/2015 Last refill: 06/22/2015  Okay to refill?

## 2015-09-18 NOTE — Telephone Encounter (Signed)
Closing duplicate note. Please reference Rx request encounter.

## 2015-09-20 NOTE — Telephone Encounter (Signed)
Refill OK

## 2015-09-20 NOTE — Telephone Encounter (Signed)
Rx called in 

## 2015-09-27 ENCOUNTER — Other Ambulatory Visit: Payer: Self-pay | Admitting: Family Medicine

## 2015-10-18 ENCOUNTER — Ambulatory Visit (INDEPENDENT_AMBULATORY_CARE_PROVIDER_SITE_OTHER): Payer: Medicare Other | Admitting: Family Medicine

## 2015-10-18 ENCOUNTER — Encounter: Payer: Self-pay | Admitting: Family Medicine

## 2015-10-18 VITALS — BP 110/88 | Temp 98.3°F | Resp 16

## 2015-10-18 DIAGNOSIS — I10 Essential (primary) hypertension: Secondary | ICD-10-CM

## 2015-10-18 DIAGNOSIS — R351 Nocturia: Secondary | ICD-10-CM

## 2015-10-18 DIAGNOSIS — N529 Male erectile dysfunction, unspecified: Secondary | ICD-10-CM | POA: Diagnosis not present

## 2015-10-18 MED ORDER — TADALAFIL 5 MG PO TABS: 5.0000 mg | ORAL_TABLET | Freq: Every day | ORAL | Status: DC

## 2015-10-18 NOTE — Progress Notes (Signed)
   Subjective:    Patient ID: Wayne Rhodes, male    DOB: [birth year], 60 y.o.   MRN: [id number]  HPI Patient seen for the following issues  Erectile dysfunction. He has taken Viagra in the past but felt like Cialis worked better. He also is concerned about possible BPH symptoms. He had PSA 1.28 back March 16. He has frequent nocturia 2-4 times per night with some recent slow stream. No burning with urination. Denies any urinary urgency. No major fatigue issues. No regular alcohol use.  No nitroglycerin use. Nonsmoker. Does have history of hypertension currently treated with HCTZ and diltiazem. He had angioedema type reaction with ACE inhibitor so we are avoiding ACE inhibitor and angiotensin receptor blockers. No recent headaches. No chest pains.  Past Medical History  Diagnosis Date  . Hyperlipidemia   . Hypertension   . Pneumonia     had pneumonia 4-5 months ago  . Recurrent upper respiratory infection (URI)     recent chest cold - treated with mucinex and cough medicine - now improved  . GERD (gastroesophageal reflux disease)   . Anxiety     takes xanax for anxiety   Past Surgical History  Procedure Laterality Date  . Knee arthroscopy      left  . Elbow surgery      both elbow surgery  . Anterior cervical decomp/discectomy fusion  09/25/2011    Procedure: ANTERIOR CERVICAL DECOMPRESSION/DISCECTOMY FUSION 1 LEVEL;  Surgeon: Dahlia Bailiff;  Location: [facility];  Service: Orthopedics;  Laterality: N/A;  ACDF Cervical 5-6    reports that he has never smoked. He does not have any smokeless tobacco history on file. He reports that he drinks about 0.6 oz of alcohol per week. He reports that he does not use illicit drugs. family history includes Cancer in his father. Allergies  Allergen Reactions  . Clonazepam     Angioedema   . Quinapril     Angioedema   . Sertraline Hcl Other (See Comments)    Suicidal thoughts      Review of Systems  Constitutional: Negative for fatigue  and unexpected weight change.  Eyes: Negative for visual disturbance.  Respiratory: Negative for cough, chest tightness and shortness of breath.   Cardiovascular: Negative for chest pain, palpitations and leg swelling.  Genitourinary: Positive for decreased urine volume (occasional slow stream). Negative for dysuria and hematuria.  Neurological: Negative for dizziness, syncope, weakness, light-headedness and headaches.       Objective:   Physical Exam  Constitutional: He is oriented to person, place, and time. He appears well-developed and well-nourished. No distress.  Neck: Neck supple. No JVD present. No thyromegaly present.  Cardiovascular: Normal rate and regular rhythm.   Pulmonary/Chest: Effort normal and breath sounds normal. No respiratory distress. He has no wheezes. He has no rales.  Musculoskeletal: He exhibits no edema.  Neurological: He is alert and oriented to person, place, and time.          Assessment & Plan:  #1 hypertension. Borderline control. Repeat right arm seated 142/90. He is encouraged to lose some weight. We discussed additional medications at this point he is reluctant. Will reassess blood pressure 3 months and if not better controlled at that point consider additional medication. Avoiding Ace inhibitors and angiotensin receptor blockers given prior history of angioedema with ACE inhibitor  #2 erectile dysfunction. He also likely has concomitant BPH. Cialis 5 mg once daily.

## 2015-10-18 NOTE — Progress Notes (Signed)
Pre visit review using our clinic review tool, if applicable. No additional management support is needed unless otherwise documented below in the visit note. 

## 2015-10-18 NOTE — Patient Instructions (Signed)
Try to lose some weight Let's plan follow up for your BP in 3 months.

## 2015-11-02 ENCOUNTER — Telehealth: Payer: Self-pay | Admitting: Family Medicine

## 2015-11-02 NOTE — Telephone Encounter (Signed)
Pt states [facility] called pt this am to advise they denied his tadalafil (CIALIS) 5 MG tablet and he needs to contact our office.

## 2015-11-02 NOTE — Telephone Encounter (Signed)
I don't see that we have any recourse since he does not have any known BPH.

## 2015-11-02 NOTE — Telephone Encounter (Signed)
[place] has denied prior authorization for tadalafil (CIALIS) 5 MG tablet stating:  Drugs when used for the treatment of sexual dysfunction are excluded from coverage under Medicare rules.  This Drug is not covered under Medicare Part D.

## 2015-11-05 NOTE — Telephone Encounter (Signed)
Called and spoke with pt and pt is aware.  

## 2015-11-06 ENCOUNTER — Other Ambulatory Visit: Payer: Self-pay | Admitting: Family Medicine

## 2015-12-04 ENCOUNTER — Other Ambulatory Visit: Payer: Self-pay | Admitting: Family Medicine

## 2015-12-04 MED ORDER — HYDROCHLOROTHIAZIDE 12.5 MG PO CAPS: 12.5000 mg | ORAL_CAPSULE | Freq: Every day | ORAL | Status: DC

## 2015-12-14 ENCOUNTER — Other Ambulatory Visit: Payer: Self-pay | Admitting: Family Medicine

## 2015-12-18 NOTE — Telephone Encounter (Signed)
Pt last visit 10/18/15 Pt last refill 06/22/15 #90 with 2 refills

## 2015-12-18 NOTE — Telephone Encounter (Signed)
Pt states he called Friday and is requesting his  ALPRAZolam (XANAX) 1 MG tablet  1/3 x /day [facility] [place]

## 2015-12-19 NOTE — Telephone Encounter (Signed)
Refill with 2 additional refills. 

## 2016-01-08 DIAGNOSIS — M47816 Spondylosis without myelopathy or radiculopathy, lumbar region: Secondary | ICD-10-CM | POA: Diagnosis not present

## 2016-01-08 DIAGNOSIS — M545 Low back pain: Secondary | ICD-10-CM | POA: Diagnosis not present

## 2016-01-14 ENCOUNTER — Ambulatory Visit (INDEPENDENT_AMBULATORY_CARE_PROVIDER_SITE_OTHER)
Admission: RE | Admit: 2016-01-14 | Discharge: 2016-01-14 | Disposition: A | Discharge status: Home / Self Care | Payer: Medicare Other | Source: Ambulatory Visit | Attending: Family Medicine | Admitting: Family Medicine

## 2016-01-14 ENCOUNTER — Encounter: Payer: Self-pay | Admitting: Family Medicine

## 2016-01-14 ENCOUNTER — Ambulatory Visit (INDEPENDENT_AMBULATORY_CARE_PROVIDER_SITE_OTHER): Payer: Medicare Other | Admitting: Family Medicine

## 2016-01-14 VITALS — BP 118/84 | HR 80 | Temp 97.9°F | Ht 76.0 in | Wt 254.7 lb

## 2016-01-14 DIAGNOSIS — S4991XA Unspecified injury of right shoulder and upper arm, initial encounter: Secondary | ICD-10-CM | POA: Diagnosis not present

## 2016-01-14 DIAGNOSIS — M25551 Pain in right hip: Secondary | ICD-10-CM

## 2016-01-14 DIAGNOSIS — M25511 Pain in right shoulder: Secondary | ICD-10-CM

## 2016-01-14 MED ORDER — PREDNISONE 20 MG PO TABS: ORAL_TABLET | ORAL | Status: DC

## 2016-01-14 NOTE — Progress Notes (Signed)
Subjective:    Patient ID: Wayne Rhodes, male    DOB: July 08, 1956, 60 y.o.   MRN: [id number]  HPI Acute Visit  Patient seen today for right shoulder and right hip pain following a fall at [employer] on 01/04/16. Reports while attempting to wash hands, slipped and fell on outstretched right arm and right hip. Denies head injury. No hand or wrist pain.  #1 Right shoulder pain-Describes pain at aching, increases with abduction, adduction, and extension.  Reports increasing stiffness and pain with movement. Denies tenderness to touch or bruising. He is followed by [facility] for back pain and has taken his prescribed Oxycodone with minimal relief. Pain is currently 6/10.  #2 Right hip pain-Reports pain worsens with ambulation. Radiates down leg with standing and walking. He describes pain as shooting down leg, sharp, and rates 6/10 with sitting. Pain increases with walking. Denies foot lag or loosing balance while walking.     Past Medical History  Diagnosis Date  . Hyperlipidemia   . Hypertension   . Pneumonia     had pneumonia 4-5 months ago  . Recurrent upper respiratory infection (URI)     recent chest cold - treated with mucinex and cough medicine - now improved  . GERD (gastroesophageal reflux disease)   . Anxiety     takes xanax for anxiety   Past Surgical History  Procedure Laterality Date  . Knee arthroscopy      left  . Elbow surgery      both elbow surgery  . Anterior cervical decomp/discectomy fusion  09/25/2011    Procedure: ANTERIOR CERVICAL DECOMPRESSION/DISCECTOMY FUSION 1 LEVEL;  Surgeon: Dahlia Bailiff;  Location: [facility];  Service: Orthopedics;  Laterality: N/A;  ACDF Cervical 5-6    reports that he has never smoked. He does not have any smokeless tobacco history on file. He reports that he drinks about 0.6 oz of alcohol per week. He reports that he does not use illicit drugs. family history includes Cancer in his father. Allergies    Allergen Reactions  . Clonazepam     Angioedema   . Quinapril     Angioedema   . Sertraline Hcl Other (See Comments)    Suicidal thoughts      Review of Systems  Respiratory: Negative.   Cardiovascular: Negative.   Musculoskeletal:       See HPI  Skin: Negative.   Allergic/Immunologic: Negative.   Neurological: Positive for weakness (right shoulder vs pain limiting movement.). Negative for syncope.  Hematological: Negative.        Objective:   Physical Exam  Constitutional: He is oriented to person, place, and time. He appears well-developed and well-nourished.  HENT:  Head: Normocephalic and atraumatic.  Eyes: Conjunctivae and EOM are normal. Pupils are equal, round, and reactive to light.  Cardiovascular: Normal rate and regular rhythm.   Pulmonary/Chest: Effort normal and breath sounds normal.  Musculoskeletal: He exhibits tenderness. He exhibits no edema.  Right shoulder-negative for pain with palpation  Limited ROM with adduction, abduction,and internal rotation secondary to pain.. Hand grips moderate strength. No navicular tenderness. Wrist nontender and full range of motion.  Right hip and leg pain- ROM intact . Low back pain elicited with adduction, abduction, extension, and flexion.    Neurological: He is alert and oriented to person, place, and time. Coordination normal.  Patellar reflex-sluggish bilaterally Achilles reflex +2 bilaterally Biceps and triceps reflex +2 symmetrically   Skin: Skin is warm and dry.  Right shoulder and hip negative of ecchymosis           Assessment & Plan:  #1  Right Shoulder Pain suspect rotator cuff injury vs tendonitis following fall. Plan: X-ray (given trauma hx)  and Prednisone 20 mg twice daily for 7 days.  #2  Right Hip Pain with radiation to right leg, likely radiculopathy.  nonfocal neuro exam  Plan: Prednisone 20 mg twice daily.  Follow-up as needed.

## 2016-01-14 NOTE — Progress Notes (Signed)
Pre visit review using our clinic review tool, if applicable. No additional management support is needed unless otherwise documented below in the visit note. 

## 2016-01-28 DIAGNOSIS — D485 Neoplasm of uncertain behavior of skin: Secondary | ICD-10-CM | POA: Diagnosis not present

## 2016-01-28 DIAGNOSIS — L812 Freckles: Secondary | ICD-10-CM | POA: Diagnosis not present

## 2016-01-28 DIAGNOSIS — L304 Erythema intertrigo: Secondary | ICD-10-CM | POA: Diagnosis not present

## 2016-01-29 DIAGNOSIS — M47816 Spondylosis without myelopathy or radiculopathy, lumbar region: Secondary | ICD-10-CM | POA: Diagnosis not present

## 2016-01-29 DIAGNOSIS — M5441 Lumbago with sciatica, right side: Secondary | ICD-10-CM | POA: Diagnosis not present

## 2016-01-29 DIAGNOSIS — G894 Chronic pain syndrome: Secondary | ICD-10-CM | POA: Diagnosis not present

## 2016-01-29 DIAGNOSIS — G8929 Other chronic pain: Secondary | ICD-10-CM | POA: Diagnosis not present

## 2016-03-15 ENCOUNTER — Other Ambulatory Visit: Payer: Self-pay | Admitting: Family Medicine

## 2016-03-18 ENCOUNTER — Telehealth: Payer: Self-pay | Admitting: Family Medicine

## 2016-03-18 ENCOUNTER — Telehealth: Payer: Self-pay

## 2016-03-18 MED ORDER — ALPRAZOLAM 1 MG PO TABS: 1.0000 mg | ORAL_TABLET | Freq: Three times a day (TID) | ORAL | Status: DC | PRN

## 2016-03-18 NOTE — Telephone Encounter (Signed)
Due on 03/20/2016.

## 2016-03-18 NOTE — Telephone Encounter (Signed)
[facility] Night - Client TELEPHONE [facility] Patient Name: Wayne Rhodes Gender: Male DOB: 1956/03/31 Age: 60 Y 29 D Return Phone Number: [phone] (Primary) Address: City/State/Zip: [place] [address] Client [facility] Night - Client Client Site [facility] - Night Physician Carolann Littler - MD Contact Type Call Who Is Calling Patient / Member / Family / Caregiver Call Type Triage / Clinical Caller Name Ulises Bells Relationship To Patient Self Return Phone Number [phone] (Primary) Chief Complaint Prescription Refill or Medication Request (non symptomatic) Reason for Call Medication Question Initial Comment [facility]er states [facility] and it was denied by the doctor, but he was due for a refill. Translation No No Triage Reason Other Nurse Assessment Nurse: Wynetta Emery, RN, Baker Janus Date/Time Eilene Ghazi Time): 03/17/2016 5:28:31 PM Confirm and document reason for call. If symptomatic, describe symptoms. You must click the next button to save text entered. ---[name] is on alpraxolam and [facility] -- they received a form back stating MD will not refill it. Has the patient traveled out of the country within the last 30 days? ---No Does the patient have any new or worsening symptoms? ---Yes Will a triage be completed? ---No Select reason for no triage. ---Other Please document clinical information provided and list any resource used. ---Nurse called patient back and advised that he will need to make appt with MD and discuss the need for this medication if MD feels it is warranted, he will submit to insurance for approval. Guidelines Guideline Title Affirmed Question Affirmed Notes Nurse Date/Time ([place] Time) Disp. Time Eilene Ghazi Time) Disposition Final User 03/17/2016 5:33:44 PM Clinical Call Yes Wynetta Emery, RN, Baker Janus

## 2016-03-18 NOTE — Telephone Encounter (Signed)
Pt need new Rx Xanax.  Pharm: [facility] [place]

## 2016-03-18 NOTE — Telephone Encounter (Signed)
Verbally called in with a start date of 03/20/2016.

## 2016-03-18 NOTE — Telephone Encounter (Signed)
Due 03/20/2016--will call in then.

## 2016-03-26 DIAGNOSIS — D0339 Melanoma in situ of other parts of face: Secondary | ICD-10-CM | POA: Diagnosis not present

## 2016-05-06 ENCOUNTER — Other Ambulatory Visit: Payer: Self-pay | Admitting: Emergency Medicine

## 2016-05-06 MED ORDER — DILTIAZEM HCL ER COATED BEADS 180 MG PO CP24: 180.0000 mg | ORAL_CAPSULE | Freq: Every day | ORAL | 0 refills | Status: DC

## 2016-05-13 ENCOUNTER — Encounter: Payer: Self-pay | Admitting: Family Medicine

## 2016-05-13 ENCOUNTER — Ambulatory Visit (INDEPENDENT_AMBULATORY_CARE_PROVIDER_SITE_OTHER): Payer: Medicare Other | Admitting: Family Medicine

## 2016-05-13 VITALS — BP 100/80 | HR 91 | Temp 97.7°F | Ht 76.0 in | Wt 266.3 lb

## 2016-05-13 DIAGNOSIS — F419 Anxiety disorder, unspecified: Secondary | ICD-10-CM | POA: Diagnosis not present

## 2016-05-13 DIAGNOSIS — I10 Essential (primary) hypertension: Secondary | ICD-10-CM | POA: Diagnosis not present

## 2016-05-13 LAB — BASIC METABOLIC PANEL
BUN: 16 mg/dL (ref 6–23)
CALCIUM: 9.5 mg/dL (ref 8.4–10.5)
CO2: 25 mEq/L (ref 19–32)
CREATININE: 1.15 mg/dL (ref 0.40–1.50)
Chloride: 103 mEq/L (ref 96–112)
GFR: 68.43 mL/min (ref >60.00)
Glucose, Bld: 99 mg/dL (ref 70–99)
Potassium: 4.1 mEq/L (ref 3.5–5.1)
Sodium: 138 mEq/L (ref 135–145)

## 2016-05-13 NOTE — Progress Notes (Signed)
Pre visit review using our clinic review tool, if applicable. No additional management support is needed unless otherwise documented below in the visit note. 

## 2016-05-13 NOTE — Patient Instructions (Signed)
Consider physical at some point later this year.

## 2016-05-13 NOTE — Progress Notes (Signed)
Subjective:     Patient ID: Wayne Rhodes, male   DOB: [birth year], 60 y.o.   MRN: [id number]  HPI Patient seen for follow-up regarding hypertension and chronic anxiety. He was tried on several SSRIs but did not tolerate any. He has been for several years on alprazolam 1 mg 3 times a day. He has hypertension treated with diltiazem and HCTZ. No dizziness. No chest pains. Compliant with therapy.  Chronic low back pain followed by orthopedics and maintained on oxycodone. He states his back pain is stable  Past Medical History:  Diagnosis Date  . Anxiety    takes xanax for anxiety  . GERD (gastroesophageal reflux disease)   . Hyperlipidemia   . Hypertension   . Pneumonia    had pneumonia 4-5 months ago  . Recurrent upper respiratory infection (URI)    recent chest cold - treated with mucinex and cough medicine - now improved   Past Surgical History:  Procedure Laterality Date  . ANTERIOR CERVICAL DECOMP/DISCECTOMY FUSION  09/25/2011   Procedure: ANTERIOR CERVICAL DECOMPRESSION/DISCECTOMY FUSION 1 LEVEL;  Surgeon: Dahlia Bailiff;  Location: [facility];  Service: Orthopedics;  Laterality: N/A;  ACDF Cervical 5-6  . ELBOW SURGERY     both elbow surgery  . KNEE ARTHROSCOPY     left    reports that he has never smoked. He does not have any smokeless tobacco history on file. He reports that he drinks about 0.6 oz of alcohol per week . He reports that he does not use drugs. family history includes Cancer in his father. Allergies  Allergen Reactions  . Clonazepam     Angioedema   . Quinapril     Angioedema   . Sertraline Hcl Other (See Comments)    Suicidal thoughts     Review of Systems  Constitutional: Negative for appetite change and unexpected weight change.  Eyes: Negative for visual disturbance.  Respiratory: Negative for cough, chest tightness and shortness of breath.   Cardiovascular: Negative for chest pain, palpitations and leg swelling.  Musculoskeletal: Positive for back  pain.  Neurological: Negative for dizziness, syncope, weakness, light-headedness and headaches.       Objective:   Physical Exam  Constitutional: He is oriented to person, place, and time. He appears well-developed and well-nourished.  HENT:  Right Ear: External ear normal.  Left Ear: External ear normal.  Mouth/Throat: Oropharynx is clear and moist.  Eyes: Pupils are equal, round, and reactive to light.  Neck: Neck supple. No thyromegaly present.  Cardiovascular: Normal rate and regular rhythm.   Pulmonary/Chest: Effort normal and breath sounds normal. No respiratory distress. He has no wheezes. He has no rales.  Musculoskeletal: He exhibits no edema.  Neurological: He is alert and oriented to person, place, and time.       Assessment:     #1 hypertension stable and at goal  #2 chronic anxiety state- stable    Plan:     -check basic metabolic panel -Continue current medications -Long discussion with patient regarding appropriate use of medications. We specifically cautioned him about benzodiazepine and chronic opioid use and to be aware not to take any other sedative hypnotics or mix with alcohol. Check urine drug screen  Eulas Post MD [facility] at [facility]

## 2016-05-16 ENCOUNTER — Other Ambulatory Visit: Payer: Self-pay | Admitting: Family Medicine

## 2016-05-19 ENCOUNTER — Other Ambulatory Visit: Payer: Self-pay | Admitting: Family Medicine

## 2016-05-19 NOTE — Telephone Encounter (Signed)
We sent him over for urine drug screen last week.  Confirm if that was done. Refill once pending that test.

## 2016-05-19 NOTE — Telephone Encounter (Signed)
Pt requesting Alprazolam 1mg   Last filled 03/20/16 #90 refill: 1   Last seen in office 05/13/16   Okay to refill?

## 2016-05-20 NOTE — Telephone Encounter (Signed)
He will need to comply with screening labs as ordered or we will not be able to write this any longer.  Would refill one week only but needs drug screen that was ordered.  Did he refuse screen?

## 2016-05-20 NOTE — Telephone Encounter (Signed)
° °  Pt call asking if the following rx is going to be refill    ALPRAZolam (XANAX) 1 MG tablet

## 2016-05-20 NOTE — Telephone Encounter (Signed)
Pt never went to lab for urine drug screen.

## 2016-05-21 DIAGNOSIS — Z79899 Other long term (current) drug therapy: Secondary | ICD-10-CM | POA: Diagnosis not present

## 2016-05-21 NOTE — Telephone Encounter (Signed)
Pt is on his way now for the urine screen.  Pt states he did not know he had to do the urine screen.  Wants to know if we will refill after that today.

## 2016-05-21 NOTE — Telephone Encounter (Signed)
See other message on this pt.

## 2016-05-21 NOTE — Telephone Encounter (Signed)
Pt came in today for the drug screening. Never said while he did not have it done in the first place. Its been 8 days since he was told he was going to have one? He want to know if you will go ahead with a small amount of Alprazolam until then.

## 2016-05-22 NOTE — Telephone Encounter (Signed)
I called the pt and informed him of the message below.  He stated he was not aware he needed a drug screen at his visit and he did come to the office yesterday and gave a sample to the lab.  Also stated he sees Dr Nelva Bush for pain management and he does occasional drug screens also.  Message forwarded to Dr Elease Hashimoto and the pt is aware to expect a call on Friday when Dr Elease Hashimoto returns to the office.

## 2016-05-22 NOTE — Telephone Encounter (Signed)
We need to clarify from lab whether they informed him on 05-13-16 of need for urine drug screen.  He took the form with him to the lab

## 2016-05-22 NOTE — Telephone Encounter (Signed)
Since this patient did not go for urine drug screen as recommended, I would give him the following options: #1- I will be happy to refer him to psychiatry to manage his anxiety symptoms or #2- We will work with him to taper off Xanax over the next couple of weeks.  If he refuses, we will no longer prescribe it.        If he chooses #2 option would have him taper to 0.5 mg TID for one week and then decrease by one dose/day every 3 days until off (ie take one po bid for 3 days and then one daily for 3 days and then d/c)  (we could give him Xanax 0.5 mg #30 tablets) if he agrees with the later.

## 2016-05-23 NOTE — Telephone Encounter (Signed)
See previous message before Joann---pt came in Wednesday for the screening.

## 2016-05-26 NOTE — Telephone Encounter (Signed)
Pt following up on rx request.  Pt will wait to hear from [place] or check with pharmacy later today.

## 2016-05-26 NOTE — Telephone Encounter (Signed)
They did not know to screen pt. So it looks like it fell through the cracks.

## 2016-05-26 NOTE — Telephone Encounter (Signed)
Refill once- pending urine drug screen

## 2016-05-27 NOTE — Telephone Encounter (Signed)
Wayne Rhodes should pt come back for urine drug screen

## 2016-05-27 NOTE — Telephone Encounter (Signed)
Pt is aware that script was called in.

## 2016-05-29 DIAGNOSIS — M47816 Spondylosis without myelopathy or radiculopathy, lumbar region: Secondary | ICD-10-CM | POA: Diagnosis not present

## 2016-05-29 DIAGNOSIS — Z79891 Long term (current) use of opiate analgesic: Secondary | ICD-10-CM | POA: Diagnosis not present

## 2016-05-29 DIAGNOSIS — G894 Chronic pain syndrome: Secondary | ICD-10-CM | POA: Diagnosis not present

## 2016-06-05 ENCOUNTER — Encounter: Payer: Self-pay | Admitting: Family Medicine

## 2016-06-08 ENCOUNTER — Other Ambulatory Visit: Payer: Self-pay | Admitting: Family Medicine

## 2016-06-09 NOTE — Telephone Encounter (Signed)
Rx refill sent to pharmacy. 

## 2016-06-24 ENCOUNTER — Other Ambulatory Visit: Payer: Self-pay | Admitting: Family Medicine

## 2016-07-15 DIAGNOSIS — M5441 Lumbago with sciatica, right side: Secondary | ICD-10-CM | POA: Diagnosis not present

## 2016-07-15 DIAGNOSIS — M47816 Spondylosis without myelopathy or radiculopathy, lumbar region: Secondary | ICD-10-CM | POA: Diagnosis not present

## 2016-08-03 ENCOUNTER — Other Ambulatory Visit: Payer: Self-pay | Admitting: Family Medicine

## 2016-08-04 DIAGNOSIS — M5441 Lumbago with sciatica, right side: Secondary | ICD-10-CM | POA: Diagnosis not present

## 2016-09-15 ENCOUNTER — Ambulatory Visit (INDEPENDENT_AMBULATORY_CARE_PROVIDER_SITE_OTHER): Payer: Medicare Other | Admitting: Family Medicine

## 2016-09-15 ENCOUNTER — Encounter: Payer: Self-pay | Admitting: Family Medicine

## 2016-09-15 VITALS — BP 130/90 | HR 91 | Temp 97.6°F | Ht 76.0 in | Wt 272.9 lb

## 2016-09-15 DIAGNOSIS — H9311 Tinnitus, right ear: Secondary | ICD-10-CM

## 2016-09-15 NOTE — Patient Instructions (Signed)
Tinnitus  Tinnitus refers to hearing a sound when there is no actual source for that sound. This is often described as ringing in the ears. However, people with this condition may hear a variety of noises. A person may hear the sound in one ear or in both ears.  The sounds of tinnitus can be soft, loud, or somewhere in between. Tinnitus can last for a few seconds or can be constant for days. It may go away without treatment and come back at various times. When tinnitus is constant or happens often, it can lead to other problems, such as trouble sleeping and trouble concentrating.  Almost everyone experiences tinnitus at some point. Tinnitus that is long-lasting (chronic) or comes back often is a problem that may require medical attention.  What are the causes?  The cause of tinnitus is often not known. In some cases, it can result from other problems or conditions, including:  · Exposure to loud noises from machinery, music, or other sources.  · Hearing loss.  · Ear or sinus infections.  · Earwax buildup.  · A foreign object in the ear.  · Use of certain medicines.  · Use of alcohol and caffeine.  · High blood pressure.  · Heart diseases.  · Anemia.  · Allergies.  · Meniere disease.  · Thyroid problems.  · Tumors.  · An enlarged part of a weakened blood vessel (aneurysm).    What are the signs or symptoms?  The main symptom of tinnitus is hearing a sound when there is no source for that sound. It may sound like:  · Buzzing.  · Roaring.  · Ringing.  · Blowing air, similar to the sound heard when you listen to a seashell.  · Hissing.  · Whistling.  · Sizzling.  · Humming.  · Running water.  · A sustained musical note.    How is this diagnosed?  Tinnitus is diagnosed based on your symptoms. Your health care provider will do a physical exam. A comprehensive hearing exam (audiologic exam) will be done if your tinnitus:  · Affects only one ear (unilateral).  · Causes hearing difficulties.  · Lasts 6 months or  longer.    You may also need to see a health care provider who specializes in hearing disorders (audiologist). You may be asked to complete a questionnaire to determine the severity of your tinnitus. Tests may be done to help determine the cause and to rule out other conditions. These can include:  · Imaging studies of your head and brain, such as:  ? A CT scan.  ? An MRI.  · An imaging study of your blood vessels (angiogram).    How is this treated?  Treating an underlying medical condition can sometimes make tinnitus go away. If your tinnitus continues, other treatments may include:  · Medicines, such as certain antidepressants or sleeping aids.  · Sound generators to mask the tinnitus. These include:  ? Tabletop sound machines that play relaxing sounds to help you fall asleep.  ? Wearable devices that fit in your ear and play sounds or music.  ? A small device that uses headphones to deliver a signal embedded in music (acoustic neural stimulation). In time, this may change the pathways of your brain and make you less sensitive to tinnitus. This device is used for very severe cases when no other treatment is working.  · Therapy and counseling to help you manage the stress of living with tinnitus.  · Using   hearing aids or cochlear implants, if your tinnitus is related to hearing loss.    Follow these instructions at home:  · When possible, avoid being in loud places and being exposed to loud sounds.  · Wear hearing protection, such as earplugs, when you are exposed to loud noises.  · Do not take stimulants, such as nicotine, alcohol, or caffeine.  · Practice techniques for reducing stress, such as meditation, yoga, or deep breathing.  · Use a white noise machine, a humidifier, or other devices to mask the sound of tinnitus.  · Sleep with your head slightly raised. This may reduce the impact of tinnitus.  · Try to get plenty of rest each night.  Contact a health care provider if:  · You have tinnitus in just one  ear.  · Your tinnitus continues for 3 weeks or longer without stopping.  · Home care measures are not helping.  · You have tinnitus after a head injury.  · You have tinnitus along with any of the following:  ? Dizziness.  ? Loss of balance.  ? Nausea and vomiting.  This information is not intended to replace advice given to you by your health care provider. Make sure you discuss any questions you have with your health care provider.  Document Released: 09/29/2005 Document Revised: 06/01/2016 Document Reviewed: 03/01/2014  Elsevier Interactive Patient Education © 2017 [employer] Inc[employer]

## 2016-09-15 NOTE — Progress Notes (Signed)
Subjective:     Patient ID: Wayne Rhodes, male   DOB: [birth year], 60 y.o.   MRN: [id number]  HPI Patient seen with chief complaint of ringing in the right ear for the past couple weeks. Symptoms have been relatively continuous. 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 No aspirin use. Denies any sudden hearing change. No headaches. Denies any pulsatile tinnitus. No vertigo. Symptoms seem to be worse at night. Denies any recent audiology assessment  Past Medical History:  Diagnosis Date  . Anxiety    takes xanax for anxiety  . GERD (gastroesophageal reflux disease)   . Hyperlipidemia   . Hypertension   . Pneumonia    had pneumonia 4-5 months ago  . Recurrent upper respiratory infection (URI)    recent chest cold - treated with mucinex and cough medicine - now improved   Past Surgical History:  Procedure Laterality Date  . ANTERIOR CERVICAL DECOMP/DISCECTOMY FUSION  09/25/2011   Procedure: ANTERIOR CERVICAL DECOMPRESSION/DISCECTOMY FUSION 1 LEVEL;  Surgeon: Dahlia Bailiff;  Location: [facility];  Service: Orthopedics;  Laterality: N/A;  ACDF Cervical 5-6  . ELBOW SURGERY     both elbow surgery  . KNEE ARTHROSCOPY     left    reports that he has never smoked. He does not have any smokeless tobacco history on file. He reports that he drinks about 0.6 oz of alcohol per week . He reports that he does not use drugs. family history includes Cancer in his father. Allergies  Allergen Reactions  . Clonazepam     Angioedema   . Quinapril     Angioedema   . Sertraline Hcl Other (See Comments)    Suicidal thoughts     Review of Systems  Constitutional: Negative for chills and fever.  HENT: Negative for congestion, ear discharge, ear pain and hearing loss.   Eyes: Negative for visual disturbance.  Respiratory: Negative for shortness of breath.   Cardiovascular: Negative for chest pain.  Neurological: Negative for headaches.        Objective:   Physical Exam  Constitutional: He is oriented to person, place, and time. He appears well-developed and well-nourished.  HENT:  Right Ear: External ear normal.  Left Ear: External ear normal.  Mouth/Throat: Oropharynx is clear and moist.  Neck: Neck supple.  No carotid bruits  Cardiovascular: Normal rate and regular rhythm.   Pulmonary/Chest: Effort normal and breath sounds normal. No respiratory distress. He has no wheezes. He has no rales.  Lymphadenopathy:    He has no cervical adenopathy.  Neurological: He is alert and oriented to person, place, and time. No cranial nerve deficit.       Assessment:     Tinnitus involving the right ear. He does not have any obvious explanation such as cerumen. He does not describe any pulsatile tinnitus or hearing changes    Plan:     -We recommended starting off with audiology assessment. He will set this up. We gave him names of some local audiologists. Follow-up immediately for a hearing loss or any pulsatile tinnitus or other new red flags  Eulas Post MD [facility] at [facility]

## 2016-11-12 DIAGNOSIS — M5441 Lumbago with sciatica, right side: Secondary | ICD-10-CM | POA: Diagnosis not present

## 2016-11-12 DIAGNOSIS — M47816 Spondylosis without myelopathy or radiculopathy, lumbar region: Secondary | ICD-10-CM | POA: Diagnosis not present

## 2016-12-03 ENCOUNTER — Telehealth: Payer: Self-pay | Admitting: Family Medicine

## 2016-12-03 NOTE — Telephone Encounter (Signed)
Left a detailed message on pt's chart asking if he has had a flu shot this season. Instructed to call office back so that we can update his chart.

## 2016-12-03 NOTE — Telephone Encounter (Signed)
°  Precious Bard pt returned your call   Pt called back to say he has not had a flu shot and he does not want one.

## 2016-12-04 NOTE — Telephone Encounter (Signed)
Noted.  Pt's chart updated.  

## 2016-12-10 DIAGNOSIS — M47816 Spondylosis without myelopathy or radiculopathy, lumbar region: Secondary | ICD-10-CM | POA: Diagnosis not present

## 2016-12-10 DIAGNOSIS — Z79891 Long term (current) use of opiate analgesic: Secondary | ICD-10-CM | POA: Diagnosis not present

## 2016-12-10 DIAGNOSIS — M5441 Lumbago with sciatica, right side: Secondary | ICD-10-CM | POA: Diagnosis not present

## 2016-12-10 DIAGNOSIS — G8929 Other chronic pain: Secondary | ICD-10-CM | POA: Diagnosis not present

## 2016-12-17 ENCOUNTER — Other Ambulatory Visit: Payer: Self-pay | Admitting: Family Medicine

## 2016-12-17 NOTE — Telephone Encounter (Signed)
Refill once and schedule office follow-up

## 2016-12-17 NOTE — Telephone Encounter (Signed)
Last refill 06/25/16.  Last office visit 09/15/16.  Okay to fill?

## 2016-12-26 ENCOUNTER — Encounter: Payer: Self-pay | Admitting: Family Medicine

## 2016-12-26 ENCOUNTER — Ambulatory Visit (INDEPENDENT_AMBULATORY_CARE_PROVIDER_SITE_OTHER): Payer: Medicare Other | Admitting: Family Medicine

## 2016-12-26 VITALS — BP 110/78 | HR 98 | Temp 98.1°F | Wt 263.3 lb

## 2016-12-26 DIAGNOSIS — F419 Anxiety disorder, unspecified: Secondary | ICD-10-CM

## 2016-12-26 DIAGNOSIS — G8929 Other chronic pain: Secondary | ICD-10-CM | POA: Diagnosis not present

## 2016-12-26 DIAGNOSIS — M545 Low back pain, unspecified: Secondary | ICD-10-CM | POA: Insufficient documentation

## 2016-12-26 DIAGNOSIS — I10 Essential (primary) hypertension: Secondary | ICD-10-CM | POA: Diagnosis not present

## 2016-12-26 MED ORDER — DILTIAZEM HCL ER COATED BEADS 120 MG PO CP24: 120.0000 mg | ORAL_CAPSULE | Freq: Every day | ORAL | 3 refills | Status: DC

## 2016-12-26 NOTE — Progress Notes (Signed)
Subjective:     Patient ID: Wayne Rhodes, male   DOB: 08/29/1956, 61 y.o.   MRN: [id number]  HPI Patient seen for the following items  Hypertension. Currently treated with HCTZ and Cardizem 180 mg daily. Denies any headaches. No chest pains. No dizziness.  He states he is compliant with medications.  Chronic back pain followed by [facility] orthopedics. Is on oxycodone apparently 15 mg every 6 hours. Still has ongoing back pain. He's had several injections.  Chronic anxiety. Currently takes alprazolam 1 mg 3 times a day. Has tried multiple SSRIs but either did not tolerate or had no improvement. No alcohol use. Toxicology screen in August was positive for metabolite of alprazolam.  Denies any depression symptoms.  Past Medical History:  Diagnosis Date  . Anxiety    takes xanax for anxiety  . GERD (gastroesophageal reflux disease)   . Hyperlipidemia   . Hypertension   . Pneumonia    had pneumonia 4-5 months ago  . Recurrent upper respiratory infection (URI)    recent chest cold - treated with mucinex and cough medicine - now improved   Past Surgical History:  Procedure Laterality Date  . ANTERIOR CERVICAL DECOMP/DISCECTOMY FUSION  09/25/2011   Procedure: ANTERIOR CERVICAL DECOMPRESSION/DISCECTOMY FUSION 1 LEVEL;  Surgeon: Dahlia Bailiff;  Location: [place];  Service: Orthopedics;  Laterality: N/A;  ACDF Cervical 5-6  . ELBOW SURGERY     both elbow surgery  . KNEE ARTHROSCOPY     left    reports that he has never smoked. He has never used smokeless tobacco. He reports that he drinks about 0.6 oz of alcohol per week . He reports that he does not use drugs. family history includes Cancer in his father. Allergies  Allergen Reactions  . Clonazepam     Angioedema   . Quinapril     Angioedema   . Sertraline Hcl Other (See Comments)    Suicidal thoughts     Review of Systems  Constitutional: Negative for fatigue.  Eyes: Negative for visual disturbance.  Respiratory: Negative  for cough, chest tightness and shortness of breath.   Cardiovascular: Negative for chest pain, palpitations and leg swelling.  Neurological: Negative for dizziness, syncope, weakness, light-headedness and headaches.       Objective:   Physical Exam  Constitutional: He is oriented to person, place, and time. He appears well-developed and well-nourished.  HENT:  Right Ear: External ear normal.  Left Ear: External ear normal.  Mouth/Throat: Oropharynx is clear and moist.  Eyes: Pupils are equal, round, and reactive to light.  Neck: Neck supple. No thyromegaly present.  Cardiovascular: Normal rate and regular rhythm.   Pulmonary/Chest: Effort normal and breath sounds normal. No respiratory distress. He has no wheezes. He has no rales.  Musculoskeletal: He exhibits no edema.  Neurological: He is alert and oriented to person, place, and time.  Psychiatric: He has a normal mood and affect. His behavior is normal. Thought content normal.       Assessment:     #1 hypertension stable and at goal  #2 chronic anxiety    Plan:     -Reduce Cardizem to 120 mg once daily and reassess blood pressure in 2 months. If well-controlled at that point consider discontinue Cardizem -We strongly advocated that he reduce his alprazolam by one half tablet every week until off completely and we reviewed in detail how to go about this. He does not have any clear indication to stay on this indefinitely and  we discussed with him substantial increased risk of combination with benzodiazepine and opioid. He agrees with tapering. We also discussed possible increased risk of falls and cognitive decline with chronic benzo use as he ages. Wayne Rhodes recommend referral to behavioral health for further anxiety management if not doing well after tapering off.  Wayne Post MD  Primary Care at [facility]

## 2016-12-26 NOTE — Progress Notes (Signed)
Pre visit review using our clinic review tool, if applicable. No additional management support is needed unless otherwise documented below in the visit note. 

## 2016-12-26 NOTE — Patient Instructions (Signed)
Try to reduce the Alprazolam by one half tablet every one week as discussed.

## 2017-01-15 ENCOUNTER — Other Ambulatory Visit: Payer: Self-pay | Admitting: Family Medicine

## 2017-01-15 NOTE — Telephone Encounter (Signed)
Last rx given on 3/7 for #90 with no refills

## 2017-01-16 NOTE — Telephone Encounter (Signed)
We are in process of tapering him off Xanax.  May refill #60 with no additional refills and continue to taper off 1/2 mg from his total daily dose per week.

## 2017-02-12 ENCOUNTER — Other Ambulatory Visit: Payer: Self-pay | Admitting: Family Medicine

## 2017-02-12 NOTE — Telephone Encounter (Signed)
Last Rx given on 4/6 for #60-no refills

## 2017-02-13 NOTE — Telephone Encounter (Signed)
Rx called in, appointment made, patient is aware

## 2017-02-13 NOTE — Telephone Encounter (Signed)
He should be tapering off as per 3-6 office visit.  Also needs to have office visit in May to reassess BP.  May refill #30 and he should continue to taper as instructed.

## 2017-02-17 ENCOUNTER — Encounter: Payer: Self-pay | Admitting: Family Medicine

## 2017-02-17 ENCOUNTER — Ambulatory Visit (INDEPENDENT_AMBULATORY_CARE_PROVIDER_SITE_OTHER): Payer: Medicare Other | Admitting: Family Medicine

## 2017-02-17 VITALS — BP 110/80 | HR 86 | Temp 97.8°F | Wt 266.4 lb

## 2017-02-17 DIAGNOSIS — I10 Essential (primary) hypertension: Secondary | ICD-10-CM

## 2017-02-17 DIAGNOSIS — F419 Anxiety disorder, unspecified: Secondary | ICD-10-CM | POA: Diagnosis not present

## 2017-02-17 NOTE — Progress Notes (Signed)
Subjective:     Patient ID: Wayne Rhodes, male   DOB: [birth year], 61 y.o.   MRN: [id number]  HPI Patient seen for follow-up regarding hypertension and some chronic anxiety. Refer to prior note. He is tapered his Xanax down to 1 mg one half tablet 3 times daily. He is complaining of increased anxiousness. He declines seeing psychiatry or psychologist for further anxiety management at this time.  We reduced his diltiazem dosage last visit and blood pressures remain stable. No headaches or dizziness. No chest pains.  Past Medical History:  Diagnosis Date  . Anxiety    takes xanax for anxiety  . GERD (gastroesophageal reflux disease)   . Hyperlipidemia   . Hypertension   . Pneumonia    had pneumonia 4-5 months ago  . Recurrent upper respiratory infection (URI)    recent chest cold - treated with mucinex and cough medicine - now improved   Past Surgical History:  Procedure Laterality Date  . ANTERIOR CERVICAL DECOMP/DISCECTOMY FUSION  09/25/2011   Procedure: ANTERIOR CERVICAL DECOMPRESSION/DISCECTOMY FUSION 1 LEVEL;  Surgeon: Dahlia Bailiff;  Location: [place];  Service: Orthopedics;  Laterality: N/A;  ACDF Cervical 5-6  . ELBOW SURGERY     both elbow surgery  . KNEE ARTHROSCOPY     left    reports that he has never smoked. He has never used smokeless tobacco. He reports that he drinks about 0.6 oz of alcohol per week . He reports that he does not use drugs. family history includes Cancer in his father. Allergies  Allergen Reactions  . Clonazepam     Angioedema   . Quinapril     Angioedema   . Sertraline Hcl Other (See Comments)    Suicidal thoughts     Review of Systems  Constitutional: Negative for fatigue.  Eyes: Negative for visual disturbance.  Respiratory: Negative for cough, chest tightness and shortness of breath.   Cardiovascular: Negative for chest pain, palpitations and leg swelling.  Neurological: Negative for dizziness, syncope, weakness, light-headedness and  headaches.  Psychiatric/Behavioral: Negative for dysphoric mood. The patient is nervous/anxious.        Objective:   Physical Exam  Constitutional: He appears well-developed and well-nourished.  Cardiovascular: Normal rate and regular rhythm.   Pulmonary/Chest: Effort normal and breath sounds normal. No respiratory distress. He has no wheezes. He has no rales.  Psychiatric: He has a normal mood and affect. His behavior is normal. Judgment and thought content normal.       Assessment:     #1 hypertension stable and at goal  #2 chronic anxiety in process of tapering down Xanax with no clear indication for chronic use    Plan:     -We again discussed nonpharmacologic ways of managing anxiety -Continue alprazolam 1 mg one half tablet 3 times daily. At next refill will refill for 0.5 mg and have him reduce that to one half tablet 3 times daily and then taper further from there   Eulas Post MD [facility] at [facility]

## 2017-02-17 NOTE — Patient Instructions (Signed)
Continue with the 1/2 tablet of Xanax three times daily for now At next refill will start 0.5 mg and take 1/2 three time daily and taper further from there.

## 2017-02-23 ENCOUNTER — Other Ambulatory Visit: Payer: Self-pay | Admitting: Family Medicine

## 2017-02-23 NOTE — Telephone Encounter (Signed)
Refill for 0.5 mg and take one half tablet three time daily and continue to taper off as discussed.  #45.

## 2017-02-23 NOTE — Telephone Encounter (Signed)
02/17/17 office visit -We again discussed nonpharmacologic ways of managing anxiety -Continue alprazolam 1 mg one half tablet 3 times daily. At next refill will refill for 0.5 mg and have him reduce that to one half tablet 3 times daily and then taper further from there  Okay to fill?

## 2017-02-24 MED ORDER — ALPRAZOLAM ER 0.5 MG PO TB24: ORAL_TABLET | ORAL | 0 refills | Status: DC

## 2017-02-24 NOTE — Telephone Encounter (Signed)
Rx called in .  Changes made to med list.

## 2017-03-24 ENCOUNTER — Other Ambulatory Visit: Payer: Self-pay | Admitting: Family Medicine

## 2017-03-25 MED ORDER — ALPRAZOLAM 0.25 MG PO TABS
ORAL_TABLET | ORAL | 0 refills | Status: DC
Start: 2017-03-25 — End: 2017-04-22

## 2017-03-25 NOTE — Telephone Encounter (Signed)
Make sure he gets regular Xanax (not XR- which is a once daily dose).  Refill Alprazolam 0.25 mg one po bid for 2 weeks and then one po qd for 2 weeks and then d/c.

## 2017-03-25 NOTE — Telephone Encounter (Signed)
Last refill 02/24/17 and last office visit 02/17/17. -Continue alprazolam 1 mg one half tablet 3 times daily. At next refill will refill for 0.5 mg and have him reduce that to one half tablet 3 times daily and then taper further from there  Okay to fill?

## 2017-04-01 ENCOUNTER — Telehealth: Payer: Self-pay | Admitting: Family Medicine

## 2017-04-01 NOTE — Telephone Encounter (Signed)
° ° ° °  Pt call to say that the below med is not doing him any good.    ALPRAZolam (XANAX) 0.25 MG tablet

## 2017-04-02 NOTE — Telephone Encounter (Signed)
We are in process of tapering him OFF the Xanax completely.  He appears to have largely situational anxiety.  We had discussed him getting counseling for behavioral intervention as one option.  If he declines, I would be willing to try Lexapro 10 mg po daily and follow up in one month.  It is not an option to go back up in dose on Xanax- or any other benzos.

## 2017-04-03 NOTE — Telephone Encounter (Signed)
Patient will try to continue with the ALPRAZolam (XANAX) 0.25 MG tablet

## 2017-04-21 ENCOUNTER — Other Ambulatory Visit: Payer: Self-pay | Admitting: Family Medicine

## 2017-04-21 NOTE — Progress Notes (Deleted)
Subjective:   Wayne Rhodes is a 61 y.o. male who presents for an Initial Medicare Annual Wellness Visit.  Review of Systems  No ROS.  Medicare Wellness Visit. Additional risk factors are reflected in the social history.    Sleep patterns:  Home Safety/Smoke Alarms: Feels safe in home. Smoke alarms in place.  Living environment; residence and Firearm Safety: [place] Safety/Bike Helmet: Wears seat belt.   Counseling:   Dental-  Male:   CCS-    10/18/2007 No record. PSA-  Lab Results  Component Value Date   PSA 1.28 01/03/2015   PSA 1.90 02/01/2014      Objective:    There were no vitals filed for this visit. There is no height or weight on file to calculate BMI.  Current Medications (verified) Outpatient Encounter Prescriptions as of 04/28/2017  Medication Sig  . ALPRAZolam (XANAX) 0.25 MG tablet one po bid for 2 weeks and then one po qd for 2 weeks and then d/c.  Marland Kitchen diltiazem (CARDIZEM CD) 120 MG 24 hr capsule Take 1 capsule (120 mg total) by mouth daily.  Marland Kitchen EPINEPHrine (EPI-PEN) 0.3 mg/0.3 mL DEVI Inject 0.3 mg into the muscle as needed. For anaphylaxis  . hydrochlorothiazide (MICROZIDE) 12.5 MG capsule TAKE ONE CAPSULE BY MOUTH EVERY DAY  . oxyCODONE (ROXICODONE) 15 MG immediate release tablet Take 15 mg by mouth every 6 (six) hours as needed (Dr. Nelva Bush).    No facility-administered encounter medications on file as of 04/28/2017.     Allergies (verified) Clonazepam; Quinapril; and Sertraline hcl   History: Past Medical History:  Diagnosis Date  . Anxiety    takes xanax for anxiety  . GERD (gastroesophageal reflux disease)   . Hyperlipidemia   . Hypertension   . Pneumonia    had pneumonia 4-5 months ago  . Recurrent upper respiratory infection (URI)    recent chest cold - treated with mucinex and cough medicine - now improved   Past Surgical History:  Procedure Laterality Date  . ANTERIOR CERVICAL DECOMP/DISCECTOMY FUSION  09/25/2011   Procedure:  ANTERIOR CERVICAL DECOMPRESSION/DISCECTOMY FUSION 1 LEVEL;  Surgeon: Dahlia Bailiff;  Location: [facility];  Service: Orthopedics;  Laterality: N/A;  ACDF Cervical 5-6  . ELBOW SURGERY     both elbow surgery  . KNEE ARTHROSCOPY     left   Family History  Problem Relation Age of Onset  . Cancer Father        prostate   Social History   Occupational History  . Not on file.   Social History Main Topics  . Smoking status: Never Smoker  . Smokeless tobacco: Never Used  . Alcohol use 0.6 oz/week    1 Cans of beer per week     Comment: occasional beer  . Drug use: No  . Sexual activity: Yes   Tobacco Counseling Counseling given: Not Answered   Activities of Daily Living No flowsheet data found.  Immunizations and Health Maintenance  There is no immunization history on file for this patient. Health Maintenance Due  Topic Date Due  . Hepatitis C Screening  09-Jan-1956  . HIV Screening  02/16/1969  . TETANUS/TDAP  02/16/1973    Patient Care Team: Eulas Post, MD as PCP - General (Family Medicine)  Indicate any recent Medical Services you may have received from other than [facility] providers in the past year (date may be approximate).    Assessment:   This is a routine wellness examination for [facility]. Physical  assessment deferred to PCP.  Hearing/Vision screen No exam data present  Dietary issues and exercise activities discussed:   Diet (meal preparation, eat out, water intake, caffeinated beverages, dairy products, fruits and vegetables):  Breakfast: Lunch:  Dinner:      Goals    None     Depression Screen PHQ 2/9 Scores 12/26/2016  PHQ - 2 Score 0    Fall Risk Fall Risk  12/26/2016  Falls in the past year? No    Cognitive Function:        Screening Tests Health Maintenance  Topic Date Due  . Hepatitis C Screening  [birth year]  . HIV Screening  02/16/1969  . TETANUS/TDAP  02/16/1973  . INFLUENZA VACCINE  12/19/2087 (Originally 05/13/2017)  .  COLONOSCOPY  10/17/2017        Plan:   ***  I have personally reviewed and noted the following in the patient's chart:   . Medical and social history . Use of alcohol, tobacco or illicit drugs  . Current medications and supplements . Functional ability and status . Nutritional status . Physical activity . Advanced directives . List of other physicians . Vitals . Screenings to include cognitive, depression, and falls . Referrals and appointments  In addition, I have reviewed and discussed with patient certain preventive protocols, quality metrics, and best practice recommendations. A written personalized care plan for preventive services as well as general preventive health recommendations were provided to patient.     Ree Edman, RN   04/21/2017

## 2017-04-21 NOTE — Progress Notes (Deleted)
Pre visit review using our clinic review tool, if applicable. No additional management support is needed unless otherwise documented below in the visit note. 

## 2017-04-22 ENCOUNTER — Telehealth: Payer: Self-pay | Admitting: Family Medicine

## 2017-04-22 NOTE — Telephone Encounter (Signed)
Declined.  He should be off at this time. See last rx.

## 2017-04-22 NOTE — Telephone Encounter (Signed)
° ° ° °  Pt request refill of the following:  ALPRAZolam (XANAX) 0.25 MG tablet   Phamacy:

## 2017-04-22 NOTE — Telephone Encounter (Signed)
Denial sent to pharmacy and Xanax removed from med list.

## 2017-04-22 NOTE — Telephone Encounter (Signed)
Last refill was 03/25/17 and last office visit 02/17/17.

## 2017-04-28 ENCOUNTER — Telehealth: Payer: Self-pay | Admitting: Family Medicine

## 2017-04-28 ENCOUNTER — Ambulatory Visit: Payer: Medicare Other

## 2017-04-28 NOTE — Telephone Encounter (Signed)
Called pt about AWV that is scheduled on 7/26 - health coach will not be in the office.  Left message for him to call us back so we can reschedule.

## 2017-05-07 ENCOUNTER — Ambulatory Visit: Payer: Medicare Other

## 2017-05-14 ENCOUNTER — Ambulatory Visit: Payer: Medicare Other

## 2017-06-02 ENCOUNTER — Other Ambulatory Visit: Payer: Self-pay | Admitting: Family Medicine

## 2018-01-19 ENCOUNTER — Other Ambulatory Visit: Payer: Self-pay | Admitting: Family Medicine

## 2018-01-19 DIAGNOSIS — R1030 Lower abdominal pain, unspecified: Secondary | ICD-10-CM

## 2018-01-27 ENCOUNTER — Other Ambulatory Visit: Payer: Medicare Other

## 2018-02-26 ENCOUNTER — Other Ambulatory Visit: Payer: Medicare Other

## 2018-05-10 ENCOUNTER — Ambulatory Visit: Payer: Medicare Other | Admitting: Physician Assistant

## 2018-05-10 NOTE — Progress Notes (Signed)
Patient presents to clinic today to transfer care from our [facility] office.  Acute Concerns: Swelling around ankles/feet x 3 weeks. Denies change in diet. Is staying active. Is currently on a regimen of HCTZ 25 mg daily and amlodipine 10 mg daily that he has been on for some time with good control of BP. Patient denies chest pain, palpitations, lightheadedness, dizziness, vision changes or frequent headaches.  BP Readings from Last 3 Encounters:  05/11/18 (!) 142/88  02/17/17 110/80  12/26/16 110/78   Patient also endorses a swollen and clogged feeling in his ears bilaterally. Denies pain or drainage from ears. Denies popping or pressure. States they feel very scaly and crusty.  Patient also with a significant history of anxiety, both generalized and with occasional panic attack. Was previously on Sertraline for more long-lasting therapy but had suicidal ideations while on medication. As such this was stopped and he was continued on Xanax as needed. Notes doing well with this regimen. Was being seen by another PCP with [facility] for a short period of time who put him on Vistaril which made him too sleepy. Has been on other medications by this provider but cannot remember the names. Denies depressed mood or anhedonia. Denies SI/HI.  Health Maintenance: Immunizations -- Unsure of last Tetanus. Will obtain records for [facility] to review. Colonoscopy -- Due. Last in 10/2007.  Past Medical History:  Diagnosis Date  . Anxiety    takes xanax for anxiety  . Cancer ([place])   . GERD (gastroesophageal reflux disease)   . Hyperlipidemia   . Hypertension   . Pneumonia    had pneumonia 4-5 months ago  . Recurrent upper respiratory infection (URI)    recent chest cold - treated with mucinex and cough medicine - now improved    Past Surgical History:  Procedure Laterality Date  . ANTERIOR CERVICAL DECOMP/DISCECTOMY FUSION  09/25/2011   Procedure: ANTERIOR CERVICAL  DECOMPRESSION/DISCECTOMY FUSION 1 LEVEL;  Surgeon: Dahlia Bailiff;  Location: [facility];  Service: Orthopedics;  Laterality: N/A;  ACDF Cervical 5-6  . ELBOW SURGERY     both elbow surgery  . KNEE ARTHROSCOPY     left    Current Outpatient Medications on File Prior to Visit  Medication Sig Dispense Refill  . amLODipine (NORVASC) 10 MG tablet amlodipine 10 mg tablet    . oxyCODONE (ROXICODONE) 15 MG immediate release tablet Take 15 mg by mouth every 6 (six) hours as needed (Dr. Nelva Bush).     . EPINEPHrine (EPI-PEN) 0.3 mg/0.3 mL DEVI Inject 0.3 mg into the muscle as needed. For anaphylaxis    . [DISCONTINUED] buPROPion (WELLBUTRIN XL) 150 MG 24 hr tablet Take 150 mg by mouth daily.    . [DISCONTINUED] diphenhydrAMINE (BENADRYL) 25 MG tablet Take 25 mg by mouth 2 (two) times daily as needed. For swelling     No current facility-administered medications on file prior to visit.     Allergies  Allergen Reactions  . Clonazepam     Angioedema   . Quinapril     Angioedema   . Sertraline Hcl Other (See Comments)    Suicidal thoughts    Family History  Problem Relation Age of Onset  . Cancer Father        prostate    Social History   Socioeconomic History  . Marital status: Single    Spouse name: Not on file  . Number of children: Not on file  . Years of education: Not on file  . Highest  education level: Not on file  Occupational History  . Not on file  Social Needs  . Financial resource strain: Not on file  . Food insecurity:    Worry: Not on file    Inability: Not on file  . Transportation needs:    Medical: Not on file    Non-medical: Not on file  Tobacco Use  . Smoking status: Never Smoker  . Smokeless tobacco: Never Used  Substance and Sexual Activity  . Alcohol use: Yes    Alcohol/week: 0.6 oz    Types: 1 Cans of beer per week    Comment: occasional beer  . Drug use: No  . Sexual activity: Yes  Lifestyle  . Physical activity:    Days per week: Not on file     Minutes per session: Not on file  . Stress: Not on file  Relationships  . Social connections:    Talks on phone: Not on file    Gets together: Not on file    Attends religious service: Not on file    Active member of club or organization: Not on file    Attends meetings of clubs or organizations: Not on file    Relationship status: Not on file  . Intimate partner violence:    Fear of current or ex partner: Not on file    Emotionally abused: Not on file    Physically abused: Not on file    Forced sexual activity: Not on file  Other Topics Concern  . Not on file  Social History Narrative  . Not on file   Review of Systems  Constitutional: Negative for chills and fever.  HENT: Positive for ear discharge and tinnitus. Negative for congestion, sinus pain and sore throat.   Eyes: Negative for blurred vision and double vision.  Respiratory: Negative for shortness of breath and stridor.   Cardiovascular: Negative for chest pain and palpitations.  Neurological: Negative for dizziness, loss of consciousness and headaches.  Psychiatric/Behavioral: Negative for depression, hallucinations, substance abuse and suicidal ideas. The patient is nervous/anxious. The patient does not have insomnia.    BP (!) 142/88 (BP Location: Left Arm, Cuff Size: Normal)   Pulse 96   Temp 98 F (36.7 C) (Oral)   Resp 17   Ht 6\' 3"  (1.905 m)   Wt 259 lb 3.2 oz (117.6 kg)   SpO2 96%   BMI 32.40 kg/m   Physical Exam  Constitutional: He is oriented to person, place, and time. He appears well-developed and well-nourished.  HENT:  Head: Normocephalic and atraumatic.  Right Ear: There is swelling. No tenderness. No middle ear effusion.  Left Ear: There is drainage and swelling. No tenderness.  No middle ear effusion.  Nose: Nose normal.  Mouth/Throat: Oropharynx is clear and moist.  Eyes: Conjunctivae are normal.  Neck: Neck supple.  Cardiovascular: Normal rate, regular rhythm, normal heart sounds and  intact distal pulses.  Trace nonpitting edema noted of feet and ankles bilaterally. Pulses 2+ and equal bilaterally. Sensation intact. Temperature within normal limits.  Lymphadenopathy:    He has no cervical adenopathy.  Neurological: He is alert and oriented to person, place, and time.  Psychiatric: He has a normal mood and affect.  Vitals reviewed.  Assessment/Plan: Hypertension BP above goal. Also with mild ankle swelling, non-pitting. Will continue amlodipine as he has never had issue with this. Will stop HCTZ and switch to 25 mg Chlorthalidone daily. Close follow-up scheduled to recheck BP, swelling and potassium levels.  Otitis externa Insurance will not cover Dermotic for eczematous OE. Will start Vosol-HC. Referral to ENT placed for suction of the ear canal and further management.   Anxiety CSC signed. For now will restart Alprazolam at 0.25 mg up to BID for acute anxiety. Stress relief tactics reviewed. Will obtain records to review all previously tried medication so we can see what would be a good long-acting option for him.     Leeanne Rio, PA-C

## 2018-05-11 ENCOUNTER — Encounter: Payer: Self-pay | Admitting: Emergency Medicine

## 2018-05-11 ENCOUNTER — Telehealth: Payer: Self-pay | Admitting: Physician Assistant

## 2018-05-11 ENCOUNTER — Other Ambulatory Visit: Payer: Self-pay

## 2018-05-11 ENCOUNTER — Encounter: Payer: Self-pay | Admitting: Physician Assistant

## 2018-05-11 ENCOUNTER — Ambulatory Visit (INDEPENDENT_AMBULATORY_CARE_PROVIDER_SITE_OTHER): Payer: Medicare Other | Admitting: Physician Assistant

## 2018-05-11 VITALS — BP 142/88 | HR 96 | Temp 98.0°F | Resp 17 | Ht 75.0 in | Wt 259.2 lb

## 2018-05-11 DIAGNOSIS — F419 Anxiety disorder, unspecified: Secondary | ICD-10-CM | POA: Diagnosis not present

## 2018-05-11 DIAGNOSIS — I10 Essential (primary) hypertension: Secondary | ICD-10-CM | POA: Diagnosis not present

## 2018-05-11 DIAGNOSIS — H608X3 Other otitis externa, bilateral: Secondary | ICD-10-CM | POA: Diagnosis not present

## 2018-05-11 DIAGNOSIS — H6090 Unspecified otitis externa, unspecified ear: Secondary | ICD-10-CM | POA: Insufficient documentation

## 2018-05-11 MED ORDER — HYDROCORTISONE-ACETIC ACID 1-2 % OT SOLN: 3.0000 [drp] | Freq: Two times a day (BID) | OTIC | 0 refills | Status: DC

## 2018-05-11 MED ORDER — CHLORTHALIDONE 25 MG PO TABS: 25.0000 mg | ORAL_TABLET | Freq: Every day | ORAL | 3 refills | Status: DC

## 2018-05-11 MED ORDER — FLUOCINOLONE ACETONIDE 0.01 % OT OIL: TOPICAL_OIL | OTIC | 0 refills | Status: DC

## 2018-05-11 MED ORDER — ALPRAZOLAM 0.25 MG PO TABS: ORAL_TABLET | ORAL | 0 refills | Status: DC

## 2018-05-11 NOTE — Telephone Encounter (Signed)
Called and advised pt. He will go to pharmacy and pick it up.

## 2018-05-11 NOTE — Assessment & Plan Note (Signed)
Insurance will not cover Dermotic for eczematous OE. Will start Vosol-HC. Referral to ENT placed for suction of the ear canal and further management.

## 2018-05-11 NOTE — Telephone Encounter (Signed)
Alternative medications has been sent in.

## 2018-05-11 NOTE — Telephone Encounter (Signed)
Please advise 

## 2018-05-11 NOTE — Assessment & Plan Note (Signed)
CSC signed. For now will restart Alprazolam at 0.25 mg up to BID for acute anxiety. Stress relief tactics reviewed. Will obtain records to review all previously tried medication so we can see what would be a good long-acting option for him.

## 2018-05-11 NOTE — Telephone Encounter (Signed)
Copied from [place] [id number]. Topic: Quick Communication - Rx Refill/Question >> May 11, 2018 10:00 AM Cecelia Byars, NT wrote: Medication:  Fluocinolone Acetonide 0.01 % OIL  patient did not get filled due to cost   Has the patient contacted their pharmacy? yes (Agent: If no, request that the patient contact the pharmacy for the refill. (Agent: If yes, when and what did the pharmacy advise?  Preferred Pharmacy (with phone number or street name  [id number] - [place], [place] - [address] [phone] (Phone) [phone] (Fax)     Agent: Please be advised that RX refills may take up to 3 business days. We ask that you follow-up with your pharmacy.

## 2018-05-11 NOTE — Patient Instructions (Signed)
Please stay well-hydrated and keep a low-salt diet.  Continue your amlodipine daily. Stop the hydrochlorothiazide and start the Chlorthalidone daily.  For the ear, use the dermotic drops as directed in the R ear. For the left ear, I am setting you up with ENT for further assessment.   Follow-up with me in 2 weeks for reassessment.

## 2018-05-11 NOTE — Assessment & Plan Note (Signed)
BP above goal. Also with mild ankle swelling, non-pitting. Will continue amlodipine as he has never had issue with this. Will stop HCTZ and switch to 25 mg Chlorthalidone daily. Close follow-up scheduled to recheck BP, swelling and potassium levels.

## 2018-05-25 ENCOUNTER — Encounter: Payer: Self-pay | Admitting: Physician Assistant

## 2018-05-25 ENCOUNTER — Other Ambulatory Visit: Payer: Self-pay

## 2018-05-25 ENCOUNTER — Ambulatory Visit (INDEPENDENT_AMBULATORY_CARE_PROVIDER_SITE_OTHER): Payer: Medicare Other | Admitting: Physician Assistant

## 2018-05-25 VITALS — BP 120/72 | HR 91 | Temp 97.7°F | Resp 15 | Ht 75.0 in | Wt 258.4 lb

## 2018-05-25 DIAGNOSIS — I10 Essential (primary) hypertension: Secondary | ICD-10-CM

## 2018-05-25 DIAGNOSIS — F419 Anxiety disorder, unspecified: Secondary | ICD-10-CM

## 2018-05-25 LAB — BASIC METABOLIC PANEL
BUN: 19 mg/dL (ref 6–23)
CHLORIDE: 97 meq/L (ref 96–112)
CO2: 29 mEq/L (ref 19–32)
CREATININE: 1.38 mg/dL (ref 0.40–1.50)
Calcium: 9.4 mg/dL (ref 8.4–10.5)
GFR: 55.09 mL/min — ABNORMAL LOW (ref >60.00)
Glucose, Bld: 115 mg/dL — ABNORMAL HIGH (ref 70–99)
Potassium: 2.9 mEq/L — ABNORMAL LOW (ref 3.5–5.1)
Sodium: 136 mEq/L (ref 135–145)

## 2018-05-25 MED ORDER — VENLAFAXINE HCL ER 37.5 MG PO CP24: 37.5000 mg | ORAL_CAPSULE | Freq: Every day | ORAL | 1 refills | Status: DC

## 2018-05-25 NOTE — Assessment & Plan Note (Signed)
BP much improved. Asymptomatic. Repeat BMP today. Follow-up scheduled.

## 2018-05-25 NOTE — Progress Notes (Signed)
Patient presents to clinic today for follow-up of hypertension. At last visit patient's HCTZ was switched for Chlorthalidone. Amlodipine was kept at the same dosage. Patient endorses taking medications as directed. Is not checking BP. Patient denies chest pain, palpitations, lightheadedness, dizziness, vision changes or frequent headaches.  BP Readings from Last 3 Encounters:  05/25/18 120/72  05/11/18 (!) 142/88  02/17/17 110/80   Patient also still noting daily anxiety despite his Alprazolam. Has been on multiple long-acting medications, including BuSPar, Sertraline (suicidal ideation). Denies SI/HI.  Past Medical History:  Diagnosis Date  . Anxiety    takes xanax for anxiety  . Cancer ([place])   . GERD (gastroesophageal reflux disease)   . Hyperlipidemia   . Hypertension   . Pneumonia    had pneumonia 4-5 months ago  . Recurrent upper respiratory infection (URI)    recent chest cold - treated with mucinex and cough medicine - now improved    Current Outpatient Medications on File Prior to Visit  Medication Sig Dispense Refill  . acetic acid-hydrocortisone (VOSOL-HC) OTIC solution Place 3 drops into the right ear 2 (two) times daily. 10 mL 0  . ALPRAZolam (XANAX) 0.25 MG tablet Take 1 tablet by mouth up to twice daily for anxiety 30 tablet 0  . amLODipine (NORVASC) 10 MG tablet amlodipine 10 mg tablet    . chlorthalidone (HYGROTON) 25 MG tablet Take 1 tablet (25 mg total) by mouth daily. 30 tablet 3  . EPINEPHrine (EPI-PEN) 0.3 mg/0.3 mL DEVI Inject 0.3 mg into the muscle as needed. For anaphylaxis    . oxyCODONE (ROXICODONE) 15 MG immediate release tablet Take 15 mg by mouth every 6 (six) hours as needed (Dr. Nelva Bush).     . sildenafil (REVATIO) 20 MG tablet Take 1-2 tablets as needed 30 minutes prior to sexual activity.    . [DISCONTINUED] buPROPion (WELLBUTRIN XL) 150 MG 24 hr tablet Take 150 mg by mouth daily.    . [DISCONTINUED] diphenhydrAMINE (BENADRYL) 25 MG tablet Take 25  mg by mouth 2 (two) times daily as needed. For swelling     No current facility-administered medications on file prior to visit.     Allergies  Allergen Reactions  . Clonazepam     Angioedema   . Quinapril     Angioedema   . Sertraline Hcl Other (See Comments)    Suicidal thoughts    Family History  Problem Relation Age of Onset  . Cancer Father        prostate    Social History   Socioeconomic History  . Marital status: Single    Spouse name: Not on file  . Number of children: Not on file  . Years of education: Not on file  . Highest education level: Not on file  Occupational History  . Not on file  Social Needs  . Financial resource strain: Not on file  . Food insecurity:    Worry: Not on file    Inability: Not on file  . Transportation needs:    Medical: Not on file    Non-medical: Not on file  Tobacco Use  . Smoking status: Never Smoker  . Smokeless tobacco: Never Used  Substance and Sexual Activity  . Alcohol use: Yes    Alcohol/week: 1.0 standard drinks    Types: 1 Cans of beer per week    Comment: occasional beer  . Drug use: No  . Sexual activity: Yes  Lifestyle  . Physical activity:    Days per  week: Not on file    Minutes per session: Not on file  . Stress: Not on file  Relationships  . Social connections:    Talks on phone: Not on file    Gets together: Not on file    Attends religious service: Not on file    Active member of club or organization: Not on file    Attends meetings of clubs or organizations: Not on file    Relationship status: Not on file  Other Topics Concern  . Not on file  Social History Narrative  . Not on file   Review of Systems - See HPI.  All other ROS are negative.  BP 120/72   Pulse 91   Temp 97.7 F (36.5 C) (Oral)   Resp 15   Ht 6\' 3"  (1.905 m)   Wt 258 lb 6.4 oz (117.2 kg)   SpO2 98%   BMI 32.30 kg/m   Physical Exam  Constitutional: He is oriented to person, place, and time. He appears  well-developed and well-nourished.  HENT:  Head: Normocephalic and atraumatic.  Right Ear: External ear normal.  Left Ear: External ear normal.  Mouth/Throat: Oropharynx is clear and moist.  Eyes: Pupils are equal, round, and reactive to light. Conjunctivae and EOM are normal.  Neck: Neck supple.  Cardiovascular: Normal rate, regular rhythm, normal heart sounds and intact distal pulses.  Pulmonary/Chest: Effort normal and breath sounds normal.  Neurological: He is alert and oriented to person, place, and time.  Skin: Skin is warm and dry.  Psychiatric: He has a normal mood and affect.  Vitals reviewed.  Assessment/Plan: Hypertension BP much improved. Asymptomatic. Repeat BMP today. Follow-up scheduled.  Anxiety Continue Alprazolam PRN. Will start trial of Venlafaxine XR 37.5 mg daily. Close follow-up scheduled.     Leeanne Rio, PA-C

## 2018-05-25 NOTE — Patient Instructions (Signed)
Please go to the lab today for blood work.  I will call you with your results. We will alter treatment regimen(s) if indicated by your results.   Start the Venlafaxine XR once daily for anxiety. Continue Alprazolam as directed for acute anxiety only. Follow-up in 1 month for reassessment.    DASH Eating Plan DASH stands for "Dietary Approaches to Stop Hypertension." The DASH eating plan is a healthy eating plan that has been shown to reduce high blood pressure (hypertension). It may also reduce your risk for type 2 diabetes, heart disease, and stroke. The DASH eating plan may also help with weight loss. What are tips for following this plan? General guidelines  Avoid eating more than 2,300 mg (milligrams) of salt (sodium) a day. If you have hypertension, you may need to reduce your sodium intake to 1,500 mg a day.  Limit alcohol intake to no more than 1 drink a day for nonpregnant women and 2 drinks a day for men. One drink equals 12 oz of beer, 5 oz of wine, or 1 oz of hard liquor.  Work with your health care provider to maintain a healthy body weight or to lose weight. Ask what an ideal weight is for you.  Get at least 30 minutes of exercise that causes your heart to beat faster (aerobic exercise) most days of the week. Activities may include walking, swimming, or biking.  Work with your health care provider or diet and nutrition specialist (dietitian) to adjust your eating plan to your individual calorie needs. Reading food labels  Check food labels for the amount of sodium per serving. Choose foods with less than 5 percent of the Daily Value of sodium. Generally, foods with less than 300 mg of sodium per serving fit into this eating plan.  To find whole grains, look for the word "whole" as the first word in the ingredient list. Shopping  Buy products labeled as "low-sodium" or "no salt added."  Buy fresh foods. Avoid canned foods and premade or frozen meals. Cooking  Avoid  adding salt when cooking. Use salt-free seasonings or herbs instead of table salt or sea salt. Check with your health care provider or pharmacist before using salt substitutes.  Do not fry foods. Cook foods using healthy methods such as baking, boiling, grilling, and broiling instead.  Cook with heart-healthy oils, such as olive, canola, soybean, or sunflower oil. Meal planning   Eat a balanced diet that includes: ? 5 or more servings of fruits and vegetables each day. At each meal, try to fill half of your plate with fruits and vegetables. ? Up to 6-8 servings of whole grains each day. ? Less than 6 oz of lean meat, poultry, or fish each day. A 3-oz serving of meat is about the same size as a deck of cards. One egg equals 1 oz. ? 2 servings of low-fat dairy each day. ? A serving of nuts, seeds, or beans 5 times each week. ? Heart-healthy fats. Healthy fats called Omega-3 fatty acids are found in foods such as flaxseeds and coldwater fish, like sardines, salmon, and mackerel.  Limit how much you eat of the following: ? Canned or prepackaged foods. ? Food that is high in trans fat, such as fried foods. ? Food that is high in saturated fat, such as fatty meat. ? Sweets, desserts, sugary drinks, and other foods with added sugar. ? Full-fat dairy products.  Do not salt foods before eating.  Try to eat at least 2 vegetarian  meals each week.  Eat more home-cooked food and less restaurant, buffet, and fast food.  When eating at a restaurant, ask that your food be prepared with less salt or no salt, if possible. What foods are recommended? The items listed may not be a complete list. Talk with your dietitian about what dietary choices are best for you. Grains Whole-grain or whole-wheat bread. Whole-grain or whole-wheat pasta. Brown rice. [place]. Bulgur. Whole-grain and low-sodium cereals. Pita bread. Low-fat, low-sodium crackers. Whole-wheat flour tortillas. Vegetables Fresh or  frozen vegetables (raw, steamed, roasted, or grilled). Low-sodium or reduced-sodium tomato and vegetable juice. Low-sodium or reduced-sodium tomato sauce and tomato paste. Low-sodium or reduced-sodium canned vegetables. Fruits All fresh, dried, or frozen fruit. Canned fruit in natural juice (without added sugar). Meat and other protein foods Skinless chicken or [place]. Ground chicken or [place]. Pork with fat trimmed off. Fish and seafood. Egg whites. Dried beans, peas, or lentils. Unsalted nuts, nut butters, and seeds. Unsalted canned beans. Lean cuts of beef with fat trimmed off. Low-sodium, lean deli meat. Dairy Low-fat (1%) or fat-free (skim) milk. Fat-free, low-fat, or reduced-fat cheeses. Nonfat, low-sodium ricotta or cottage cheese. Low-fat or nonfat yogurt. Low-fat, low-sodium cheese. Fats and oils Soft margarine without trans fats. Vegetable oil. Low-fat, reduced-fat, or light mayonnaise and salad dressings (reduced-sodium). Canola, safflower, olive, soybean, and sunflower oils. Avocado. Seasoning and other foods Herbs. Spices. Seasoning mixes without salt. Unsalted popcorn and pretzels. Fat-free sweets. What foods are not recommended? The items listed may not be a complete list. Talk with your dietitian about what dietary choices are best for you. Grains Baked goods made with fat, such as croissants, muffins, or some breads. Dry pasta or rice meal packs. Vegetables Creamed or fried vegetables. Vegetables in a cheese sauce. Regular canned vegetables (not low-sodium or reduced-sodium). Regular canned tomato sauce and paste (not low-sodium or reduced-sodium). Regular tomato and vegetable juice (not low-sodium or reduced-sodium). Angie Fava. Olives. Fruits Canned fruit in a light or heavy syrup. Fried fruit. Fruit in cream or butter sauce. Meat and other protein foods Fatty cuts of meat. Ribs. Fried meat. Berniece Salines. Sausage. Bologna and other processed lunch meats. Salami. Fatback. Hotdogs.  Bratwurst. Salted nuts and seeds. Canned beans with added salt. Canned or smoked fish. Whole eggs or egg yolks. Chicken or [place] with skin. Dairy Whole or 2% milk, cream, and half-and-half. Whole or full-fat cream cheese. Whole-fat or sweetened yogurt. Full-fat cheese. Nondairy creamers. Whipped toppings. Processed cheese and cheese spreads. Fats and oils Butter. Stick margarine. Lard. Shortening. Ghee. Bacon fat. Tropical oils, such as coconut, palm kernel, or palm oil. Seasoning and other foods Salted popcorn and pretzels. Onion salt, garlic salt, seasoned salt, table salt, and sea salt. Worcestershire sauce. Tartar sauce. Barbecue sauce. Teriyaki sauce. Soy sauce, including reduced-sodium. Steak sauce. Canned and packaged gravies. Fish sauce. Oyster sauce. Cocktail sauce. Horseradish that you find on the shelf. Ketchup. Mustard. Meat flavorings and tenderizers. Bouillon cubes. Hot sauce and Tabasco sauce. Premade or packaged marinades. Premade or packaged taco seasonings. Relishes. Regular salad dressings. Where to find more information:  National Heart, Lung, and [place]: https://wilson-eaton.com/  American Heart Association: www.heart.org Summary  The DASH eating plan is a healthy eating plan that has been shown to reduce high blood pressure (hypertension). It may also reduce your risk for type 2 diabetes, heart disease, and stroke.  With the DASH eating plan, you should limit salt (sodium) intake to 2,300 mg a day. If you have hypertension, you may need to  reduce your sodium intake to 1,500 mg a day.  When on the DASH eating plan, aim to eat more fresh fruits and vegetables, whole grains, lean proteins, low-fat dairy, and heart-healthy fats.  Work with your health care provider or diet and nutrition specialist (dietitian) to adjust your eating plan to your individual calorie needs. This information is not intended to replace advice given to you by your health care provider. Make sure you  discuss any questions you have with your health care provider. Document Released: 09/18/2011 Document Revised: 09/22/2016 Document Reviewed: 09/22/2016 Elsevier Interactive Patient Education  [employer].

## 2018-05-25 NOTE — Assessment & Plan Note (Signed)
Continue Alprazolam PRN. Will start trial of Venlafaxine XR 37.5 mg daily. Close follow-up scheduled.

## 2018-05-26 ENCOUNTER — Other Ambulatory Visit: Payer: Self-pay

## 2018-05-26 DIAGNOSIS — E876 Hypokalemia: Secondary | ICD-10-CM

## 2018-05-26 MED ORDER — POTASSIUM CHLORIDE CRYS ER 20 MEQ PO TBCR: EXTENDED_RELEASE_TABLET | ORAL | 5 refills | Status: DC

## 2018-05-28 ENCOUNTER — Ambulatory Visit: Payer: Self-pay | Admitting: *Deleted

## 2018-05-28 NOTE — Telephone Encounter (Signed)
Triage Nurse states patient only took the Effexor for 4 days and stopped today. He states his symptoms are improving since not taking the Effexor today.  Please advise in PCP absence

## 2018-05-28 NOTE — Telephone Encounter (Signed)
Continue to hold Effexor and he can discuss alternatives w/ PCP when he returns on Monday

## 2018-05-28 NOTE — Telephone Encounter (Signed)
Pt called with having symptoms from taking a new medication, venlafaxine XR 37.5 mg 24 hour tab. He started taking this medication on Monday and started "feeling  sick to his stomach, nervousness, and numbness in his head". He took the medication Monday, Tuesday, Wednesday and Thursday. He looked the medication up and found that it was antidepressant. He states that he does not need to be on antidepressant so he stopped taking it and now states that he is feeling somewhat better.  Will notify flow at [facility] that patient needs call back regarding this medication. Patina at [facility] notified.   Reason for Disposition . Caller has URGENT medication question about med that PCP prescribed and triager unable to answer question  Answer Assessment - Initial Assessment Questions 1. SYMPTOMS: "Do you have any symptoms?"     Yes, nauseated, nervousness and "numbness in head" 2. SEVERITY: If symptoms are present, ask "Are they mild, moderate or severe?"     moderate  Protocols used: MEDICATION QUESTION CALL-A-AH

## 2018-05-28 NOTE — Telephone Encounter (Signed)
Advised patient to continue to hold the Effexor medication. Will send message to PCP to review and discuss further changes. He is agreeable.

## 2018-05-28 NOTE — Telephone Encounter (Signed)
Patient has had multiple medication intolerances/side effects to several psychotropic medications. We need to have him either (1) let us set him up with psychiatry for further management or (2) have [facility] complete gene site testing to find out what medications are the best options for him.

## 2018-06-02 ENCOUNTER — Other Ambulatory Visit (INDEPENDENT_AMBULATORY_CARE_PROVIDER_SITE_OTHER): Payer: Medicare Other

## 2018-06-02 ENCOUNTER — Encounter: Payer: Self-pay | Admitting: Emergency Medicine

## 2018-06-02 DIAGNOSIS — E876 Hypokalemia: Secondary | ICD-10-CM | POA: Diagnosis not present

## 2018-06-02 DIAGNOSIS — F339 Major depressive disorder, recurrent, unspecified: Secondary | ICD-10-CM | POA: Insufficient documentation

## 2018-06-02 LAB — BASIC METABOLIC PANEL
BUN: 12 mg/dL (ref 6–23)
CHLORIDE: 101 meq/L (ref 96–112)
CO2: 26 mEq/L (ref 19–32)
CREATININE: 1.12 mg/dL (ref 0.40–1.50)
Calcium: 9 mg/dL (ref 8.4–10.5)
GFR: 70.09 mL/min (ref >60.00)
Glucose, Bld: 195 mg/dL — ABNORMAL HIGH (ref 70–99)
Potassium: 3.5 mEq/L (ref 3.5–5.1)
Sodium: 137 mEq/L (ref 135–145)

## 2018-06-02 NOTE — Telephone Encounter (Signed)
Advised patient of PCP recommendations. He states he is feeling better since stopping the Effexor. He states he has tried many medications and failed or side effects. He is agreeable with Gene sight testing. Completed today at lab visit.

## 2018-06-10 ENCOUNTER — Other Ambulatory Visit: Payer: Self-pay

## 2018-06-10 ENCOUNTER — Ambulatory Visit (INDEPENDENT_AMBULATORY_CARE_PROVIDER_SITE_OTHER): Payer: Medicare Other | Admitting: Physician Assistant

## 2018-06-10 ENCOUNTER — Encounter: Payer: Self-pay | Admitting: Physician Assistant

## 2018-06-10 DIAGNOSIS — E7212 Methylenetetrahydrofolate reductase deficiency: Secondary | ICD-10-CM | POA: Diagnosis not present

## 2018-06-10 DIAGNOSIS — F419 Anxiety disorder, unspecified: Secondary | ICD-10-CM

## 2018-06-10 DIAGNOSIS — Z1589 Genetic susceptibility to other disease: Secondary | ICD-10-CM

## 2018-06-10 MED ORDER — ALPRAZOLAM 0.25 MG PO TABS: ORAL_TABLET | ORAL | 0 refills | Status: DC

## 2018-06-10 NOTE — Progress Notes (Signed)
Patient with history of generalized anxiety disorder with intolerance to multiple medications presents to clinic today for discussion of Gene Site testing results and to select a treatment regimen guided by these results.   Past Medical History:  Diagnosis Date  . Anxiety    takes xanax for anxiety  . Cancer ([place])   . GERD (gastroesophageal reflux disease)   . Hyperlipidemia   . Hypertension   . Pneumonia    had pneumonia 4-5 months ago  . Recurrent upper respiratory infection (URI)    recent chest cold - treated with mucinex and cough medicine - now improved    Current Outpatient Medications on File Prior to Visit  Medication Sig Dispense Refill  . acetic acid-hydrocortisone (VOSOL-HC) OTIC solution Place 3 drops into the right ear 2 (two) times daily. 10 mL 0  . amLODipine (NORVASC) 10 MG tablet amlodipine 10 mg tablet    . chlorthalidone (HYGROTON) 25 MG tablet Take 1 tablet (25 mg total) by mouth daily. 30 tablet 3  . EPINEPHrine (EPI-PEN) 0.3 mg/0.3 mL DEVI Inject 0.3 mg into the muscle as needed. For anaphylaxis    . oxyCODONE (ROXICODONE) 15 MG immediate release tablet Take 15 mg by mouth every 6 (six) hours as needed (Dr. Nelva Bush).     . potassium chloride SA (KLOR-CON M20) 20 MEQ tablet Take 1 tablet 2 times daily for the first 2 days, then take 1 tablet daily starting on the 3rd day. 32 tablet 5  . sildenafil (REVATIO) 20 MG tablet Take 1-2 tablets as needed 30 minutes prior to sexual activity.    . [DISCONTINUED] buPROPion (WELLBUTRIN XL) 150 MG 24 hr tablet Take 150 mg by mouth daily.    . [DISCONTINUED] diphenhydrAMINE (BENADRYL) 25 MG tablet Take 25 mg by mouth 2 (two) times daily as needed. For swelling     No current facility-administered medications on file prior to visit.     Allergies  Allergen Reactions  . Clonazepam     Angioedema   . Quinapril     Angioedema   . Sertraline Hcl Other (See Comments)    Suicidal thoughts    Family History  Problem  Relation Age of Onset  . Cancer Father        prostate    Social History   Socioeconomic History  . Marital status: Single    Spouse name: Not on file  . Number of children: Not on file  . Years of education: Not on file  . Highest education level: Not on file  Occupational History  . Not on file  Social Needs  . Financial resource strain: Not on file  . Food insecurity:    Worry: Not on file    Inability: Not on file  . Transportation needs:    Medical: Not on file    Non-medical: Not on file  Tobacco Use  . Smoking status: Never Smoker  . Smokeless tobacco: Never Used  Substance and Sexual Activity  . Alcohol use: Yes    Alcohol/week: 1.0 standard drinks    Types: 1 Cans of beer per week    Comment: occasional beer  . Drug use: No  . Sexual activity: Yes  Lifestyle  . Physical activity:    Days per week: Not on file    Minutes per session: Not on file  . Stress: Not on file  Relationships  . Social connections:    Talks on phone: Not on file    Gets together: Not on  file    Attends religious service: Not on file    Active member of club or organization: Not on file    Attends meetings of clubs or organizations: Not on file    Relationship status: Not on file  Other Topics Concern  . Not on file  Social History Narrative  . Not on file   Review of Systems - See HPI.  All other ROS are negative.  BP 118/82   Pulse 85   Temp 98.4 F (36.9 C) (Oral)   Resp 16   Ht 6\' 3"  (1.905 m)   Wt 259 lb (117.5 kg)   SpO2 97%   BMI 32.37 kg/m   Physical Exam  Constitutional: He appears well-developed and well-nourished.  HENT:  Head: Normocephalic and atraumatic.  Cardiovascular: Normal rate and regular rhythm.  Pulmonary/Chest: Effort normal.  Psychiatric: He has a normal mood and affect.  Vitals reviewed.   Recent Results (from the past 2160 hour(s))  Basic metabolic panel     Status: Abnormal   Collection Time: 05/25/18  9:54 AM  Result Value Ref Range    Sodium 136 135 - 145 mEq/L   Potassium 2.9 (L) 3.5 - 5.1 mEq/L   Chloride 97 96 - 112 mEq/L   CO2 29 19 - 32 mEq/L   Glucose, Bld 115 (H) 70 - 99 mg/dL   BUN 19 6 - 23 mg/dL   Creatinine, Ser 1.38 0.40 - 1.50 mg/dL   Calcium 9.4 8.4 - 10.5 mg/dL   GFR 55.09 (L) >60.00 mL/min  Basic Metabolic Panel (BMET)     Status: Abnormal   Collection Time: 06/02/18  8:18 AM  Result Value Ref Range   Sodium 137 135 - 145 mEq/L   Potassium 3.5 3.5 - 5.1 mEq/L   Chloride 101 96 - 112 mEq/L   CO2 26 19 - 32 mEq/L   Glucose, Bld 195 (H) 70 - 99 mg/dL   BUN 12 6 - 23 mg/dL   Creatinine, Ser 1.12 0.40 - 1.50 mg/dL   Calcium 9.0 8.4 - 10.5 mg/dL   GFR 70.09 >60.00 mL/min   Assessment/Plan: Anxiety Reviewed Gene Site testing report with patient. Given copy. Is heterozygous for MTHFR gene, causing decreased folic acid conversion. After discussion of treatment options, he elects folic acid supplementation first. Will continue Alprazolam PRN. Follow-up scheduled.     Leeanne Rio, PA-C

## 2018-06-10 NOTE — Patient Instructions (Addendum)
Please start an L-methylfolate supplement.  You can find at a vitamin store or online  -- 5-7.5 mg dose once daily.  Continue Xanax PRN. Follow-up in 6-8 weeks for reassessment of anxiety.

## 2018-06-10 NOTE — Assessment & Plan Note (Signed)
Reviewed Gene Site testing report with patient. Given copy. Is heterozygous for MTHFR gene, causing decreased folic acid conversion. After discussion of treatment options, he elects folic acid supplementation first. Will continue Alprazolam PRN. Follow-up scheduled.

## 2018-06-22 ENCOUNTER — Telehealth: Payer: Self-pay | Admitting: Emergency Medicine

## 2018-06-22 NOTE — Telephone Encounter (Signed)
If ankle swelling continue to occur, we may need to change his amlodipine as this can cause ankle swelling. Have him monitor BP at home as well as swelling, while limiting salt intake. If still occurring, have him come see me.

## 2018-06-22 NOTE — Telephone Encounter (Signed)
Called patient and he stated that he was just at his pain doctor and his BP was 128/83. He stated that the swelling in his ankles happens normally when he is sitting for long periods. Patient also stated that he is eating out and not sure how salty his food is.  Advised patient to check his BP and also prop his legs up when he is sitting for long periods. If patient is still having trouble then he is to call us back to let us know and we will go from there.

## 2018-06-22 NOTE — Telephone Encounter (Signed)
Spoke with patient this morning and he states he still has some swelling but did improve with elevation last night. He is taking his medications as directed. He was currently at the Pain doctor when I called. Please advise     Patient called on 06/21/18 at 8:15p about foot/ankle swelling. Patient is currently taking Chlorthalidone, Potassium and Amlodipine. He was playing cards for 3-4 hrs and feet and ankles started swelling.

## 2018-06-25 ENCOUNTER — Other Ambulatory Visit: Payer: Self-pay | Admitting: Physician Assistant

## 2018-06-25 MED ORDER — ALPRAZOLAM 0.25 MG PO TABS: ORAL_TABLET | ORAL | 0 refills | Status: DC

## 2018-06-25 NOTE — Telephone Encounter (Signed)
Copied from [place] [id number]. Topic: Quick Communication - Rx Refill/Question >> Jun 25, 2018 11:41 AM Yvette Rack wrote: Medication: ALPRAZolam Duanne Moron) 0.25 MG tablet  Has the patient contacted their pharmacy? no  Preferred Pharmacy (with phone number or street name): [id number] - [place], [place] - [address] [phone] (Phone) [phone] (Fax)  Agent: Please be advised that RX refills may take up to 3 business days. We ask that you follow-up with your pharmacy.

## 2018-06-25 NOTE — Telephone Encounter (Signed)
Xanax refill Last Refill:06/10/18 # 30 with 0 refill Last OV: 06/10/18 PCP: [id number]

## 2018-07-19 ENCOUNTER — Other Ambulatory Visit: Payer: Self-pay

## 2018-07-19 ENCOUNTER — Encounter: Payer: Self-pay | Admitting: Physician Assistant

## 2018-07-19 ENCOUNTER — Ambulatory Visit (INDEPENDENT_AMBULATORY_CARE_PROVIDER_SITE_OTHER): Payer: Medicare Other | Admitting: Physician Assistant

## 2018-07-19 VITALS — BP 118/80 | HR 89 | Temp 97.9°F | Resp 16 | Wt 258.0 lb

## 2018-07-19 DIAGNOSIS — I10 Essential (primary) hypertension: Secondary | ICD-10-CM

## 2018-07-19 DIAGNOSIS — Z7689 Persons encountering health services in other specified circumstances: Secondary | ICD-10-CM | POA: Diagnosis not present

## 2018-07-19 MED ORDER — METOPROLOL SUCCINATE ER 25 MG PO TB24: 25.0000 mg | ORAL_TABLET | Freq: Every day | ORAL | 3 refills | Status: DC

## 2018-07-19 MED ORDER — CHLORTHALIDONE 25 MG PO TABS: 25.0000 mg | ORAL_TABLET | Freq: Every day | ORAL | 1 refills | Status: DC

## 2018-07-19 MED ORDER — ALPRAZOLAM 0.5 MG PO TABS: 0.5000 mg | ORAL_TABLET | Freq: Three times a day (TID) | ORAL | 0 refills | Status: DC | PRN

## 2018-07-19 NOTE — Patient Instructions (Signed)
Please stop the amlodipine. Continue the Chlorthalidone along with starting the Toprol daily. Limit salt intake. Continue potassium supplement.  Swelling in ankles should resolve. Follow-up in 2 weeks for reassessment of BP. Return sooner if needed.    DASH Eating Plan DASH stands for "Dietary Approaches to Stop Hypertension." The DASH eating plan is a healthy eating plan that has been shown to reduce high blood pressure (hypertension). It may also reduce your risk for type 2 diabetes, heart disease, and stroke. The DASH eating plan may also help with weight loss. What are tips for following this plan? General guidelines  Avoid eating more than 2,300 mg (milligrams) of salt (sodium) a day. If you have hypertension, you may need to reduce your sodium intake to 1,500 mg a day.  Limit alcohol intake to no more than 1 drink a day for nonpregnant women and 2 drinks a day for men. One drink equals 12 oz of beer, 5 oz of wine, or 1 oz of hard liquor.  Work with your health care provider to maintain a healthy body weight or to lose weight. Ask what an ideal weight is for you.  Get at least 30 minutes of exercise that causes your heart to beat faster (aerobic exercise) most days of the week. Activities may include walking, swimming, or biking.  Work with your health care provider or diet and nutrition specialist (dietitian) to adjust your eating plan to your individual calorie needs. Reading food labels  Check food labels for the amount of sodium per serving. Choose foods with less than 5 percent of the Daily Value of sodium. Generally, foods with less than 300 mg of sodium per serving fit into this eating plan.  To find whole grains, look for the word "whole" as the first word in the ingredient list. Shopping  Buy products labeled as "low-sodium" or "no salt added."  Buy fresh foods. Avoid canned foods and premade or frozen meals. Cooking  Avoid adding salt when cooking. Use salt-free  seasonings or herbs instead of table salt or sea salt. Check with your health care provider or pharmacist before using salt substitutes.  Do not fry foods. Cook foods using healthy methods such as baking, boiling, grilling, and broiling instead.  Cook with heart-healthy oils, such as olive, canola, soybean, or sunflower oil. Meal planning   Eat a balanced diet that includes: ? 5 or more servings of fruits and vegetables each day. At each meal, try to fill half of your plate with fruits and vegetables. ? Up to 6-8 servings of whole grains each day. ? Less than 6 oz of lean meat, poultry, or fish each day. A 3-oz serving of meat is about the same size as a deck of cards. One egg equals 1 oz. ? 2 servings of low-fat dairy each day. ? A serving of nuts, seeds, or beans 5 times each week. ? Heart-healthy fats. Healthy fats called Omega-3 fatty acids are found in foods such as flaxseeds and coldwater fish, like sardines, salmon, and mackerel.  Limit how much you eat of the following: ? Canned or prepackaged foods. ? Food that is high in trans fat, such as fried foods. ? Food that is high in saturated fat, such as fatty meat. ? Sweets, desserts, sugary drinks, and other foods with added sugar. ? Full-fat dairy products.  Do not salt foods before eating.  Try to eat at least 2 vegetarian meals each week.  Eat more home-cooked food and less restaurant, buffet, and fast  food.  When eating at a restaurant, ask that your food be prepared with less salt or no salt, if possible. What foods are recommended? The items listed may not be a complete list. Talk with your dietitian about what dietary choices are best for you. Grains Whole-grain or whole-wheat bread. Whole-grain or whole-wheat pasta. Brown rice. [place]. Bulgur. Whole-grain and low-sodium cereals. Pita bread. Low-fat, low-sodium crackers. Whole-wheat flour tortillas. Vegetables Fresh or frozen vegetables (raw, steamed, roasted,  or grilled). Low-sodium or reduced-sodium tomato and vegetable juice. Low-sodium or reduced-sodium tomato sauce and tomato paste. Low-sodium or reduced-sodium canned vegetables. Fruits All fresh, dried, or frozen fruit. Canned fruit in natural juice (without added sugar). Meat and other protein foods Skinless chicken or [place]. Ground chicken or [place]. Pork with fat trimmed off. Fish and seafood. Egg whites. Dried beans, peas, or lentils. Unsalted nuts, nut butters, and seeds. Unsalted canned beans. Lean cuts of beef with fat trimmed off. Low-sodium, lean deli meat. Dairy Low-fat (1%) or fat-free (skim) milk. Fat-free, low-fat, or reduced-fat cheeses. Nonfat, low-sodium ricotta or cottage cheese. Low-fat or nonfat yogurt. Low-fat, low-sodium cheese. Fats and oils Soft margarine without trans fats. Vegetable oil. Low-fat, reduced-fat, or light mayonnaise and salad dressings (reduced-sodium). Canola, safflower, olive, soybean, and sunflower oils. Avocado. Seasoning and other foods Herbs. Spices. Seasoning mixes without salt. Unsalted popcorn and pretzels. Fat-free sweets. What foods are not recommended? The items listed may not be a complete list. Talk with your dietitian about what dietary choices are best for you. Grains Baked goods made with fat, such as croissants, muffins, or some breads. Dry pasta or rice meal packs. Vegetables Creamed or fried vegetables. Vegetables in a cheese sauce. Regular canned vegetables (not low-sodium or reduced-sodium). Regular canned tomato sauce and paste (not low-sodium or reduced-sodium). Regular tomato and vegetable juice (not low-sodium or reduced-sodium). Angie Fava. Olives. Fruits Canned fruit in a light or heavy syrup. Fried fruit. Fruit in cream or butter sauce. Meat and other protein foods Fatty cuts of meat. Ribs. Fried meat. Berniece Salines. Sausage. Bologna and other processed lunch meats. Salami. Fatback. Hotdogs. Bratwurst. Salted nuts and seeds. Canned beans  with added salt. Canned or smoked fish. Whole eggs or egg yolks. Chicken or [place] with skin. Dairy Whole or 2% milk, cream, and half-and-half. Whole or full-fat cream cheese. Whole-fat or sweetened yogurt. Full-fat cheese. Nondairy creamers. Whipped toppings. Processed cheese and cheese spreads. Fats and oils Butter. Stick margarine. Lard. Shortening. Ghee. Bacon fat. Tropical oils, such as coconut, palm kernel, or palm oil. Seasoning and other foods Salted popcorn and pretzels. Onion salt, garlic salt, seasoned salt, table salt, and sea salt. Worcestershire sauce. Tartar sauce. Barbecue sauce. Teriyaki sauce. Soy sauce, including reduced-sodium. Steak sauce. Canned and packaged gravies. Fish sauce. Oyster sauce. Cocktail sauce. Horseradish that you find on the shelf. Ketchup. Mustard. Meat flavorings and tenderizers. Bouillon cubes. Hot sauce and Tabasco sauce. Premade or packaged marinades. Premade or packaged taco seasonings. Relishes. Regular salad dressings. Where to find more information:  National Heart, Lung, and [place]: https://wilson-eaton.com/  American Heart Association: www.heart.org Summary  The DASH eating plan is a healthy eating plan that has been shown to reduce high blood pressure (hypertension). It may also reduce your risk for type 2 diabetes, heart disease, and stroke.  With the DASH eating plan, you should limit salt (sodium) intake to 2,300 mg a day. If you have hypertension, you may need to reduce your sodium intake to 1,500 mg a day.  When on the DASH  eating plan, aim to eat more fresh fruits and vegetables, whole grains, lean proteins, low-fat dairy, and heart-healthy fats.  Work with your health care provider or diet and nutrition specialist (dietitian) to adjust your eating plan to your individual calorie needs. This information is not intended to replace advice given to you by your health care provider. Make sure you discuss any questions you have with your health  care provider. Document Released: 09/18/2011 Document Revised: 09/22/2016 Document Reviewed: 09/22/2016 Elsevier Interactive Patient Education  [employer].

## 2018-07-19 NOTE — Progress Notes (Signed)
Patient presents to clinic today c/o intermittent pedal edema despite use of Chlorthalidone. Notes it is always present but much worse by the end of day. Has limited salt intake and is keeping legs elevated while resting. Patient denies chest pain, palpitations, lightheadedness, dizziness, vision changes or frequent headaches. Denies any PND or orthopnea. .   Past Medical History:  Diagnosis Date  . Anxiety    takes xanax for anxiety  . Cancer ([place])   . GERD (gastroesophageal reflux disease)   . Hyperlipidemia   . Hypertension   . Pneumonia    had pneumonia 4-5 months ago  . Recurrent upper respiratory infection (URI)    recent chest cold - treated with mucinex and cough medicine - now improved    Current Outpatient Medications on File Prior to Visit  Medication Sig Dispense Refill  . acetic acid-hydrocortisone (VOSOL-HC) OTIC solution Place 3 drops into the right ear 2 (two) times daily. 10 mL 0  . ALPRAZolam (XANAX) 0.25 MG tablet Take 1 tablet by mouth up to twice daily for anxiety 30 tablet 0  . amLODipine (NORVASC) 10 MG tablet amlodipine 10 mg tablet    . chlorthalidone (HYGROTON) 25 MG tablet Take 1 tablet (25 mg total) by mouth daily. 30 tablet 3  . EPINEPHrine (EPI-PEN) 0.3 mg/0.3 mL DEVI Inject 0.3 mg into the muscle as needed. For anaphylaxis    . oxyCODONE (ROXICODONE) 15 MG immediate release tablet Take 15 mg by mouth every 6 (six) hours as needed (Dr. Nelva Bush).     . potassium chloride SA (KLOR-CON M20) 20 MEQ tablet Take 1 tablet 2 times daily for the first 2 days, then take 1 tablet daily starting on the 3rd day. 32 tablet 5  . sildenafil (REVATIO) 20 MG tablet Take 1-2 tablets as needed 30 minutes prior to sexual activity.    . [DISCONTINUED] buPROPion (WELLBUTRIN XL) 150 MG 24 hr tablet Take 150 mg by mouth daily.    . [DISCONTINUED] diphenhydrAMINE (BENADRYL) 25 MG tablet Take 25 mg by mouth 2 (two) times daily as needed. For swelling     No current  facility-administered medications on file prior to visit.     Allergies  Allergen Reactions  . Clonazepam     Angioedema   . Quinapril     Angioedema   . Sertraline Hcl Other (See Comments)    Suicidal thoughts    Family History  Problem Relation Age of Onset  . Cancer Father        prostate    Social History   Socioeconomic History  . Marital status: Single    Spouse name: Not on file  . Number of children: Not on file  . Years of education: Not on file  . Highest education level: Not on file  Occupational History  . Not on file  Social Needs  . Financial resource strain: Not on file  . Food insecurity:    Worry: Not on file    Inability: Not on file  . Transportation needs:    Medical: Not on file    Non-medical: Not on file  Tobacco Use  . Smoking status: Never Smoker  . Smokeless tobacco: Never Used  Substance and Sexual Activity  . Alcohol use: Yes    Alcohol/week: 1.0 standard drinks    Types: 1 Cans of beer per week    Comment: occasional beer  . Drug use: No  . Sexual activity: Yes  Lifestyle  . Physical activity:  Days per week: Not on file    Minutes per session: Not on file  . Stress: Not on file  Relationships  . Social connections:    Talks on phone: Not on file    Gets together: Not on file    Attends religious service: Not on file    Active member of club or organization: Not on file    Attends meetings of clubs or organizations: Not on file    Relationship status: Not on file  Other Topics Concern  . Not on file  Social History Narrative  . Not on file   Review of Systems - See HPI.  All other ROS are negative.  BP 118/80   Pulse 89   Temp 97.9 F (36.6 C) (Oral)   Resp 16   Wt 258 lb (117 kg)   SpO2 98%   BMI 32.25 kg/m   Physical Exam  Constitutional: He is oriented to person, place, and time. He appears well-developed and well-nourished.  HENT:  Head: Normocephalic and atraumatic.  Cardiovascular: Normal rate,  regular rhythm, normal heart sounds and intact distal pulses.  Pulses:      Popliteal pulses are 2+ on the right side, and 2+ on the left side.       Dorsalis pedis pulses are 2+ on the right side, and 2+ on the left side.       Posterior tibial pulses are 2+ on the right side, and 2+ on the left side.  Trace pitting edema noted of feet and ankles bilaterally. No edema noted proximal to this area.  Pulmonary/Chest: Effort normal and breath sounds normal.  Neurological: He is alert and oriented to person, place, and time.  Psychiatric: He has a normal mood and affect.  Vitals reviewed.   Recent Results (from the past 2160 hour(s))  Basic metabolic panel     Status: Abnormal   Collection Time: 05/25/18  9:54 AM  Result Value Ref Range   Sodium 136 135 - 145 mEq/L   Potassium 2.9 (L) 3.5 - 5.1 mEq/L   Chloride 97 96 - 112 mEq/L   CO2 29 19 - 32 mEq/L   Glucose, Bld 115 (H) 70 - 99 mg/dL   BUN 19 6 - 23 mg/dL   Creatinine, Ser 1.38 0.40 - 1.50 mg/dL   Calcium 9.4 8.4 - 10.5 mg/dL   GFR 55.09 (L) >60.00 mL/min  Basic Metabolic Panel (BMET)     Status: Abnormal   Collection Time: 06/02/18  8:18 AM  Result Value Ref Range   Sodium 137 135 - 145 mEq/L   Potassium 3.5 3.5 - 5.1 mEq/L   Chloride 101 96 - 112 mEq/L   CO2 26 19 - 32 mEq/L   Glucose, Bld 195 (H) 70 - 99 mg/dL   BUN 12 6 - 23 mg/dL   Creatinine, Ser 1.12 0.40 - 1.50 mg/dL   Calcium 9.0 8.4 - 10.5 mg/dL   GFR 70.09 >60.00 mL/min   Assessment/Plan: 1. Essential hypertension Suspect swelling is 2/2 amlodipine. Will continue Chlorthalidone. Dc amlodipine and start Toprol XL 25 mg daily. Continue DASH diet. Follow-up 2 weeks for reassessment.  - metoprolol succinate (TOPROL-XL) 25 MG 24 hr tablet; Take 1 tablet (25 mg total) by mouth daily.  Dispense: 30 tablet; Refill: 3  2. Referral of patient Patient requesting referral to Dr. Owens Shark here in [place] for some ongoing neck issues.  - Ambulatory referral to  Welcome, PA-C

## 2018-07-20 ENCOUNTER — Telehealth: Payer: Self-pay | Admitting: Emergency Medicine

## 2018-07-20 MED ORDER — ALPRAZOLAM 0.5 MG PO TABS: 0.5000 mg | ORAL_TABLET | Freq: Three times a day (TID) | ORAL | 0 refills | Status: DC | PRN

## 2018-07-20 NOTE — Telephone Encounter (Signed)
Rx modified to correct sig.

## 2018-08-10 ENCOUNTER — Other Ambulatory Visit: Payer: Self-pay

## 2018-08-10 ENCOUNTER — Encounter: Payer: Self-pay | Admitting: Physician Assistant

## 2018-08-10 ENCOUNTER — Ambulatory Visit (INDEPENDENT_AMBULATORY_CARE_PROVIDER_SITE_OTHER): Payer: Medicare Other | Admitting: Physician Assistant

## 2018-08-10 VITALS — BP 112/76 | HR 67 | Temp 97.4°F | Resp 14 | Ht 75.0 in | Wt 259.0 lb

## 2018-08-10 DIAGNOSIS — I10 Essential (primary) hypertension: Secondary | ICD-10-CM

## 2018-08-10 LAB — BASIC METABOLIC PANEL
BUN: 14 mg/dL (ref 6–23)
CHLORIDE: 100 meq/L (ref 96–112)
CO2: 28 meq/L (ref 19–32)
CREATININE: 1.18 mg/dL (ref 0.40–1.50)
Calcium: 9.1 mg/dL (ref 8.4–10.5)
GFR: 65.96 mL/min (ref >60.00)
Glucose, Bld: 111 mg/dL — ABNORMAL HIGH (ref 70–99)
Potassium: 3.5 mEq/L (ref 3.5–5.1)
Sodium: 137 mEq/L (ref 135–145)

## 2018-08-10 NOTE — Assessment & Plan Note (Signed)
Normotensive. Pedal edema resolved with discontinuation of CCB. BMP today. Continue current regimen. Follow-up scheduled.

## 2018-08-10 NOTE — Patient Instructions (Signed)
Legs are much improved today and BP looks good.  Please go to the lab today for blood work.  I will call you with your results. We will alter treatment regimen(s) if indicated by your results.

## 2018-08-10 NOTE — Progress Notes (Signed)
Patient presents to clinic today for follow-up of hypertension and pedal edema. At last visit, amlodipine was stopped and patient placed on Toprol XL and instructed to continue his Chlorthalidone as directed. Endorses taking medications as directed. Notes resolution of pedal edema with cessation of CCB. States he feels great. Patient denies chest pain, palpitations, lightheadedness, dizziness, vision changes or frequent headaches.  BP Readings from Last 3 Encounters:  08/10/18 112/76  07/19/18 118/80  06/10/18 118/82   Past Medical History:  Diagnosis Date  . Anxiety    takes xanax for anxiety  . Cancer ([place])   . GERD (gastroesophageal reflux disease)   . Hyperlipidemia   . Hypertension   . Pneumonia    had pneumonia 4-5 months ago  . Recurrent upper respiratory infection (URI)    recent chest cold - treated with mucinex and cough medicine - now improved    Current Outpatient Medications on File Prior to Visit  Medication Sig Dispense Refill  . acetic acid-hydrocortisone (VOSOL-HC) OTIC solution Place 3 drops into the right ear 2 (two) times daily. 10 mL 0  . ALPRAZolam (XANAX) 0.5 MG tablet Take 1 tablet (0.5 mg total) by mouth 3 (three) times daily as needed for anxiety. 90 tablet 0  . chlorthalidone (HYGROTON) 25 MG tablet Take 1 tablet (25 mg total) by mouth daily. 90 tablet 1  . EPINEPHrine (EPI-PEN) 0.3 mg/0.3 mL DEVI Inject 0.3 mg into the muscle as needed. For anaphylaxis    . L-Methylfolate 15 MG TABS Take 1 tablet by mouth daily.    . metoprolol succinate (TOPROL-XL) 25 MG 24 hr tablet Take 1 tablet (25 mg total) by mouth daily. 30 tablet 3  . oxyCODONE (ROXICODONE) 15 MG immediate release tablet Take 15 mg by mouth every 6 (six) hours as needed (Dr. Nelva Bush).     . potassium chloride SA (KLOR-CON M20) 20 MEQ tablet Take 1 tablet 2 times daily for the first 2 days, then take 1 tablet daily starting on the 3rd day. 32 tablet 5  . sildenafil (REVATIO) 20 MG tablet Take 1-2  tablets as needed 30 minutes prior to sexual activity.    . [DISCONTINUED] buPROPion (WELLBUTRIN XL) 150 MG 24 hr tablet Take 150 mg by mouth daily.    . [DISCONTINUED] diphenhydrAMINE (BENADRYL) 25 MG tablet Take 25 mg by mouth 2 (two) times daily as needed. For swelling     No current facility-administered medications on file prior to visit.     Allergies  Allergen Reactions  . Clonazepam     Angioedema   . Quinapril     Angioedema   . Sertraline Hcl Other (See Comments)    Suicidal thoughts    Family History  Problem Relation Age of Onset  . Cancer Father        prostate    Social History   Socioeconomic History  . Marital status: Single    Spouse name: Not on file  . Number of children: Not on file  . Years of education: Not on file  . Highest education level: Not on file  Occupational History  . Not on file  Social Needs  . Financial resource strain: Not on file  . Food insecurity:    Worry: Not on file    Inability: Not on file  . Transportation needs:    Medical: Not on file    Non-medical: Not on file  Tobacco Use  . Smoking status: Never Smoker  . Smokeless tobacco: Never Used  Substance and Sexual Activity  . Alcohol use: Yes    Alcohol/week: 1.0 standard drinks    Types: 1 Cans of beer per week    Comment: occasional beer  . Drug use: No  . Sexual activity: Yes  Lifestyle  . Physical activity:    Days per week: Not on file    Minutes per session: Not on file  . Stress: Not on file  Relationships  . Social connections:    Talks on phone: Not on file    Gets together: Not on file    Attends religious service: Not on file    Active member of club or organization: Not on file    Attends meetings of clubs or organizations: Not on file    Relationship status: Not on file  Other Topics Concern  . Not on file  Social History Narrative  . Not on file   Review of Systems - See HPI.  All other ROS are negative.  BP 112/76   Pulse 67   Temp  (!) 97.4 F (36.3 C) (Oral)   Resp 14   Ht 6\' 3"  (1.905 m)   Wt 259 lb (117.5 kg)   SpO2 98%   BMI 32.37 kg/m   Physical Exam  Constitutional: He is oriented to person, place, and time. He appears well-developed and well-nourished.  HENT:  Head: Normocephalic and atraumatic.  Neck: Neck supple.  Cardiovascular: Normal rate, regular rhythm, normal heart sounds and intact distal pulses.  Pulmonary/Chest: Effort normal and breath sounds normal. No stridor. No respiratory distress. He has no wheezes. He has no rales. He exhibits no tenderness.  Neurological: He is alert and oriented to person, place, and time.  Psychiatric: He has a normal mood and affect.  Vitals reviewed.   Recent Results (from the past 2160 hour(s))  Basic metabolic panel     Status: Abnormal   Collection Time: 05/25/18  9:54 AM  Result Value Ref Range   Sodium 136 135 - 145 mEq/L   Potassium 2.9 (L) 3.5 - 5.1 mEq/L   Chloride 97 96 - 112 mEq/L   CO2 29 19 - 32 mEq/L   Glucose, Bld 115 (H) 70 - 99 mg/dL   BUN 19 6 - 23 mg/dL   Creatinine, Ser 1.38 0.40 - 1.50 mg/dL   Calcium 9.4 8.4 - 10.5 mg/dL   GFR 55.09 (L) >60.00 mL/min  Basic Metabolic Panel (BMET)     Status: Abnormal   Collection Time: 06/02/18  8:18 AM  Result Value Ref Range   Sodium 137 135 - 145 mEq/L   Potassium 3.5 3.5 - 5.1 mEq/L   Chloride 101 96 - 112 mEq/L   CO2 26 19 - 32 mEq/L   Glucose, Bld 195 (H) 70 - 99 mg/dL   BUN 12 6 - 23 mg/dL   Creatinine, Ser 1.12 0.40 - 1.50 mg/dL   Calcium 9.0 8.4 - 10.5 mg/dL   GFR 70.09 >60.00 mL/min    Assessment/Plan: Hypertension Normotensive. Pedal edema resolved with discontinuation of CCB. BMP today. Continue current regimen. Follow-up scheduled.     Leeanne Rio, PA-C

## 2018-08-17 ENCOUNTER — Other Ambulatory Visit: Payer: Self-pay | Admitting: Physician Assistant

## 2018-08-17 NOTE — Telephone Encounter (Signed)
Last Xanax rx 07/20/18 #90 [facility] on 05/25/18 LOV: 08/10/18  Please advise

## 2018-08-23 ENCOUNTER — Telehealth: Payer: Self-pay | Admitting: Physician Assistant

## 2018-08-23 NOTE — Telephone Encounter (Signed)
Yes he needs to continue the potassium supplement while he is taking the diuretic (Chlorthalidone) to keep potassium at a stable level. One tablet daily. Ok to send in 90-day with 1 refill.

## 2018-08-23 NOTE — Telephone Encounter (Signed)
Copied from [place] [id number]. Topic: Quick Communication - Rx Refill/Question >> Aug 23, 2018  9:51 AM Sheran Luz wrote: Medication: potassium chloride SA (KLOR-CON M20) 20 MEQ tablet  Patient would like to know if he should continue to take this medication stating that his potassium levels were better at last visit.

## 2018-08-23 NOTE — Telephone Encounter (Signed)
Pt. States his potassium "was better this last time". Wants to know if he can stop it. He has 10 pills in his last refill. Please advise pt.

## 2018-08-23 NOTE — Telephone Encounter (Signed)
Spoke with patient to advise that he needs to continue taking the medication.  Stated verbal understanding. Patient states he has refills left that he will call [facility] and have them fill this medication.

## 2018-09-13 ENCOUNTER — Other Ambulatory Visit: Payer: Self-pay | Admitting: Physician Assistant

## 2018-09-13 NOTE — Telephone Encounter (Signed)
Last rx for Xanax filled on 08/17/18 #90 [facility] signed 05/25/18 UDS: 05/21/16  Last OV: 08/10/18

## 2018-10-04 ENCOUNTER — Other Ambulatory Visit: Payer: Self-pay | Admitting: Physician Assistant

## 2018-10-04 DIAGNOSIS — I10 Essential (primary) hypertension: Secondary | ICD-10-CM

## 2018-10-07 ENCOUNTER — Encounter: Payer: Self-pay | Admitting: Physician Assistant

## 2018-10-07 ENCOUNTER — Ambulatory Visit (INDEPENDENT_AMBULATORY_CARE_PROVIDER_SITE_OTHER): Payer: Medicare Other

## 2018-10-07 ENCOUNTER — Other Ambulatory Visit: Payer: Self-pay | Admitting: *Deleted

## 2018-10-07 ENCOUNTER — Other Ambulatory Visit: Payer: Self-pay

## 2018-10-07 ENCOUNTER — Ambulatory Visit (INDEPENDENT_AMBULATORY_CARE_PROVIDER_SITE_OTHER): Payer: Medicare Other | Admitting: Physician Assistant

## 2018-10-07 VITALS — BP 130/90 | HR 75 | Temp 97.6°F | Resp 16 | Ht 75.0 in | Wt 265.0 lb

## 2018-10-07 DIAGNOSIS — M79642 Pain in left hand: Secondary | ICD-10-CM

## 2018-10-07 MED ORDER — MELOXICAM 15 MG PO TABS: 15.0000 mg | ORAL_TABLET | Freq: Every day | ORAL | 0 refills | Status: DC

## 2018-10-07 NOTE — Patient Instructions (Signed)
Please go to the [facility] office for x-ray. We will call you with your results and alter treatment accordingly.   [address]  [place], [place] [address]

## 2018-10-07 NOTE — Progress Notes (Signed)
Patient presents to clinic today c/o 4+ weeks of pain of hands bilaterally, L worse than R. Notes pain is aching in nature and constant, worse in the morning but without stiffness. Notes some mild swelling of left ring finger intermittently. Denies weakness of hands.  Denies numbness or tingling.  Past Medical History:  Diagnosis Date  . Anxiety    takes xanax for anxiety  . Cancer ([place])   . GERD (gastroesophageal reflux disease)   . Hyperlipidemia   . Hypertension   . Pneumonia    had pneumonia 4-5 months ago  . Recurrent upper respiratory infection (URI)    recent chest cold - treated with mucinex and cough medicine - now improved    Current Outpatient Medications on File Prior to Visit  Medication Sig Dispense Refill  . ALPRAZolam (XANAX) 1 MG tablet TAKE 1/2 TABLET (0.5 MG TOTAL) BY MOUTH 3 (THREE) TIMES DAILY AS NEEDED FOR ANXIETY. 45 tablet 0  . chlorthalidone (HYGROTON) 25 MG tablet Take 1 tablet (25 mg total) by mouth daily. 90 tablet 1  . EPINEPHrine (EPI-PEN) 0.3 mg/0.3 mL DEVI Inject 0.3 mg into the muscle as needed. For anaphylaxis    . L-Methylfolate 15 MG TABS Take 1 tablet by mouth daily.    . metoprolol succinate (TOPROL-XL) 25 MG 24 hr tablet TAKE 1 TABLET BY MOUTH EVERY DAY 90 tablet 1  . oxyCODONE (ROXICODONE) 15 MG immediate release tablet Take 15 mg by mouth every 6 (six) hours as needed (Dr. Nelva Bush).     . potassium chloride SA (KLOR-CON M20) 20 MEQ tablet Take 1 tablet 2 times daily for the first 2 days, then take 1 tablet daily starting on the 3rd day. 32 tablet 5  . sildenafil (REVATIO) 20 MG tablet Take 1-2 tablets as needed 30 minutes prior to sexual activity.    . [DISCONTINUED] buPROPion (WELLBUTRIN XL) 150 MG 24 hr tablet Take 150 mg by mouth daily.    . [DISCONTINUED] diphenhydrAMINE (BENADRYL) 25 MG tablet Take 25 mg by mouth 2 (two) times daily as needed. For swelling     No current facility-administered medications on file prior to visit.      Allergies  Allergen Reactions  . Clonazepam     Angioedema   . Quinapril     Angioedema   . Sertraline Hcl Other (See Comments)    Suicidal thoughts    Family History  Problem Relation Age of Onset  . Cancer Father        prostate    Social History   Socioeconomic History  . Marital status: Single    Spouse name: Not on file  . Number of children: Not on file  . Years of education: Not on file  . Highest education level: Not on file  Occupational History  . Not on file  Social Needs  . Financial resource strain: Not on file  . Food insecurity:    Worry: Not on file    Inability: Not on file  . Transportation needs:    Medical: Not on file    Non-medical: Not on file  Tobacco Use  . Smoking status: Never Smoker  . Smokeless tobacco: Never Used  Substance and Sexual Activity  . Alcohol use: Yes    Alcohol/week: 1.0 standard drinks    Types: 1 Cans of beer per week    Comment: occasional beer  . Drug use: No  . Sexual activity: Yes  Lifestyle  . Physical activity:    Days  per week: Not on file    Minutes per session: Not on file  . Stress: Not on file  Relationships  . Social connections:    Talks on phone: Not on file    Gets together: Not on file    Attends religious service: Not on file    Active member of club or organization: Not on file    Attends meetings of clubs or organizations: Not on file    Relationship status: Not on file  Other Topics Concern  . Not on file  Social History Narrative  . Not on file   Review of Systems - See HPI.  All other ROS are negative.  BP 130/90   Pulse 75   Temp 97.6 F (36.4 C) (Oral)   Resp 16   Ht 6\' 3"  (1.905 m)   Wt 265 lb (120.2 kg)   SpO2 97%   BMI 33.12 kg/m   Physical Exam Vitals signs reviewed.  Constitutional:      Appearance: Normal appearance.  HENT:     Head: Normocephalic and atraumatic.  Neck:     Musculoskeletal: Neck supple.  Cardiovascular:     Rate and Rhythm: Normal rate  and regular rhythm.     Pulses: Normal pulses.     Heart sounds: Normal heart sounds.  Musculoskeletal:     Right wrist: Normal.     Left wrist: Normal.     Right hand: He exhibits normal range of motion, no tenderness and no bony tenderness. Normal sensation noted. Normal strength noted.     Left hand: He exhibits normal range of motion, no tenderness and no bony tenderness. Normal sensation noted. Normal strength noted.  Neurological:     Mental Status: He is alert.    Recent Results (from the past 2160 hour(s))  Basic metabolic panel     Status: Abnormal   Collection Time: 08/10/18 10:59 AM  Result Value Ref Range   Sodium 137 135 - 145 mEq/L   Potassium 3.5 3.5 - 5.1 mEq/L   Chloride 100 96 - 112 mEq/L   CO2 28 19 - 32 mEq/L   Glucose, Bld 111 (H) 70 - 99 mg/dL   BUN 14 6 - 23 mg/dL   Creatinine, Ser 1.18 0.40 - 1.50 mg/dL   Calcium 9.1 8.4 - 10.5 mg/dL   GFR 65.96 >60.00 mL/min    Assessment/Plan: 1. Left hand pain Bilateral hand pain but L much worse. Exam overall unremarkable today. Will check x-ray left hand to further assess giving chronicity. Will alter further workup and treatment based on results.  - DG Hand Complete Left; Future   Leeanne Rio, PA-C

## 2018-10-11 ENCOUNTER — Other Ambulatory Visit: Payer: Self-pay | Admitting: Physician Assistant

## 2018-10-11 NOTE — Telephone Encounter (Signed)
Last OV: 08/10/18 Last Filled: 09/13/18 #45, 0

## 2018-10-13 HISTORY — PX: POLYPECTOMY: SHX149

## 2018-10-13 HISTORY — PX: COLONOSCOPY: SHX174

## 2018-11-11 ENCOUNTER — Other Ambulatory Visit: Payer: Self-pay | Admitting: Physician Assistant

## 2018-11-11 NOTE — Telephone Encounter (Signed)
Xanax last rx on 10/11/18 #45 CSC: 05/25/18 UDS: 05/31/16  LOV: 10/07/18 Hand pain  Please advise

## 2018-11-18 ENCOUNTER — Other Ambulatory Visit: Payer: Self-pay | Admitting: Physician Assistant

## 2018-12-13 ENCOUNTER — Other Ambulatory Visit: Payer: Self-pay | Admitting: Physician Assistant

## 2018-12-13 NOTE — Telephone Encounter (Signed)
Last refill:11/12/18 #45, 0 Last OV:10/07/18

## 2019-01-10 ENCOUNTER — Other Ambulatory Visit: Payer: Self-pay | Admitting: Physician Assistant

## 2019-01-10 NOTE — Telephone Encounter (Signed)
Xanax last rx 12/13/18 #45 CSC: 05/25/18 UDS: 05/21/16 Last OV: 10/07/18   Please advise

## 2019-01-11 ENCOUNTER — Telehealth: Payer: Self-pay | Admitting: Emergency Medicine

## 2019-01-11 NOTE — Telephone Encounter (Signed)
Spoke with patient and he received a call from his back doctor. He has an appointment with them next week for evaluation.   Per PCP he will need to contact back doctor for evaluation. Sounds like symptoms are stemming from low back.  Copied from [place] [id number]. Topic: Appointment Scheduling - Scheduling Inquiry for Clinic >> Jan 11, 2019  9:24 AM Reyne Dumas L wrote: Reason for CRM:   Pt called and left voicemail on [place] states he wants to know if he should see Elyn Aquas or his back doctor.  States he is waking up every night with pain in both legs that will not go away until he gets up. Pt can be reached at [phone]

## 2019-01-29 ENCOUNTER — Other Ambulatory Visit: Payer: Self-pay | Admitting: Physician Assistant

## 2019-02-08 ENCOUNTER — Other Ambulatory Visit: Payer: Self-pay | Admitting: Physician Assistant

## 2019-02-08 NOTE — Telephone Encounter (Signed)
Xanax last rx 01/10/19 #45 CSC: 05/25/18 LOV: 10/07/18

## 2019-02-25 ENCOUNTER — Telehealth: Payer: Self-pay | Admitting: Physician Assistant

## 2019-02-25 NOTE — Telephone Encounter (Unsigned)
Copied from [place] [id number]. Topic: General - Other >> Feb 25, 2019 11:22 AM Celene Kras A wrote: Reason for CRM: Pt called stating he is breaking out on his right hand and is requesting a cream be sent into the pharmacy. Pt states he does not have video capability. Please advise.  [id number] - [place], [place] - [address] [place] [address] Phone: [phone] Fax: [phone] Not a 24 hour pharmacy; exact hours not known.

## 2019-02-25 NOTE — Telephone Encounter (Signed)
Pt is scheduled °

## 2019-02-25 NOTE — Telephone Encounter (Signed)
Can we schedule him for a phone visit or OV? I need to know more to make sure we are treating appropriately.

## 2019-02-28 ENCOUNTER — Ambulatory Visit (INDEPENDENT_AMBULATORY_CARE_PROVIDER_SITE_OTHER): Payer: Medicare Other | Admitting: Physician Assistant

## 2019-02-28 ENCOUNTER — Encounter: Payer: Self-pay | Admitting: Physician Assistant

## 2019-02-28 ENCOUNTER — Other Ambulatory Visit: Payer: Self-pay

## 2019-02-28 VITALS — BP 132/80 | HR 81 | Temp 97.3°F | Resp 16 | Ht 75.0 in | Wt 265.0 lb

## 2019-02-28 DIAGNOSIS — L309 Dermatitis, unspecified: Secondary | ICD-10-CM

## 2019-02-28 MED ORDER — TRIAMCINOLONE ACETONIDE 0.5 % EX OINT: 1.0000 "application " | TOPICAL_OINTMENT | Freq: Two times a day (BID) | CUTANEOUS | 0 refills | Status: DC

## 2019-02-28 NOTE — Progress Notes (Signed)
Patient presents to clinic today c/o red, scaly rash of dorsal hands bilaterally starting about 2 months ago.  Patient endorses symptoms have progressively worsened since onset.  Notes symptoms started after he started using hand sanitizer multiple times a day.  Denies change to other soaps, lotions, detergents or other hygiene products.  Patient denies fever, chills or malaise.  Notes skin feels very dry.  Denies any blistering or draining lesion in the area.  Denies rash elsewhere.  Has history of similar, but milder symptoms a few years ago.  States he was treated with steroid cream with resolution of symptoms.  Past Medical History:  Diagnosis Date  . Anxiety    takes xanax for anxiety  . Cancer ([place])   . GERD (gastroesophageal reflux disease)   . Hyperlipidemia   . Hypertension   . Pneumonia    had pneumonia 4-5 months ago  . Recurrent upper respiratory infection (URI)    recent chest cold - treated with mucinex and cough medicine - now improved    Current Outpatient Medications on File Prior to Visit  Medication Sig Dispense Refill  . ALPRAZolam (XANAX) 1 MG tablet TAKE 1/2 TABLET BY MOUTH 3 TIMES A DAY AS NEEDED FOR ANXIETY 45 tablet 0  . chlorthalidone (HYGROTON) 25 MG tablet TAKE 1 TABLET BY MOUTH EVERY DAY 90 tablet 1  . EPINEPHrine (EPI-PEN) 0.3 mg/0.3 mL DEVI Inject 0.3 mg into the muscle as needed. For anaphylaxis    . L-Methylfolate 15 MG TABS Take 1 tablet by mouth daily.    . metoprolol succinate (TOPROL-XL) 25 MG 24 hr tablet TAKE 1 TABLET BY MOUTH EVERY DAY 90 tablet 1  . oxyCODONE (ROXICODONE) 15 MG immediate release tablet Take 15 mg by mouth every 6 (six) hours as needed (Dr. Nelva Bush).     . potassium chloride SA (KLOR-CON M20) 20 MEQ tablet TAKE 1 TABLET BY MOUTH DAILY 90 tablet 1  . sildenafil (REVATIO) 20 MG tablet Take 1-2 tablets as needed 30 minutes prior to sexual activity.    . [DISCONTINUED] buPROPion (WELLBUTRIN XL) 150 MG 24 hr tablet Take 150 mg by mouth  daily.    . [DISCONTINUED] diphenhydrAMINE (BENADRYL) 25 MG tablet Take 25 mg by mouth 2 (two) times daily as needed. For swelling     No current facility-administered medications on file prior to visit.     Allergies  Allergen Reactions  . Clonazepam     Angioedema   . Quinapril     Angioedema   . Sertraline Hcl Other (See Comments)    Suicidal thoughts    Family History  Problem Relation Age of Onset  . Cancer Father        prostate    Social History   Socioeconomic History  . Marital status: Single    Spouse name: Not on file  . Number of children: Not on file  . Years of education: Not on file  . Highest education level: Not on file  Occupational History  . Not on file  Social Needs  . Financial resource strain: Not on file  . Food insecurity:    Worry: Not on file    Inability: Not on file  . Transportation needs:    Medical: Not on file    Non-medical: Not on file  Tobacco Use  . Smoking status: Never Smoker  . Smokeless tobacco: Never Used  Substance and Sexual Activity  . Alcohol use: Yes    Alcohol/week: 1.0 standard drinks  Types: 1 Cans of beer per week    Comment: occasional beer  . Drug use: No  . Sexual activity: Yes  Lifestyle  . Physical activity:    Days per week: Not on file    Minutes per session: Not on file  . Stress: Not on file  Relationships  . Social connections:    Talks on phone: Not on file    Gets together: Not on file    Attends religious service: Not on file    Active member of club or organization: Not on file    Attends meetings of clubs or organizations: Not on file    Relationship status: Not on file  Other Topics Concern  . Not on file  Social History Narrative  . Not on file   Review of Systems - See HPI.  All other ROS are negative.  BP 132/80   Pulse 81   Resp 16   Ht 6\' 3"  (1.905 m)   Wt 265 lb (120.2 kg)   SpO2 97%   BMI 33.12 kg/m   Physical Exam Vitals signs reviewed.  Constitutional:       Appearance: Normal appearance.  HENT:     Head: Normocephalic and atraumatic.  Neck:     Musculoskeletal: Neck supple.  Cardiovascular:     Rate and Rhythm: Normal rate and regular rhythm.  Pulmonary:     Effort: Pulmonary effort is normal.  Skin:    General: Skin is dry.       Neurological:     Mental Status: He is alert.      Assessment/Plan: 1. Eczema of both hands Moderate of hands bilaterally with right worse than left.  No rash on palmar aspect of hands.  No rash noted elsewhere.  Discussed avoidance of hand sanitizer.  Proper hand hygiene reviewed, keeping in mind his current symptoms.  Will start moisturizing cream 2-3 times per day.  Recommended an occlusive dressing at night after nighttime application of moisturizer.  Rx Kenalog 0.5% ointment to use twice daily x2 weeks.  Follow-up if not improving.   Leeanne Rio, PA-C

## 2019-02-28 NOTE — Patient Instructions (Signed)
Please avoid very hot water. Wash hands in warm, clean water. Pat dry. Avoid hand sanitizer when possible as it has irritated skin significantly. Apply the steroid cream twice daily over the next 2 weeks. Then cut back to as needed. Apply a good moisturizing lotion to the hands throughout the day as well. I would recommend Eucerin or Aquaphor. Avoid harsh hand soaps like Dial, etc and try something nonscented and gentle like Cetaphil, Dove. If washing dished, make sure to use a gentle dish detergent like Dawn.  Keep hydrated. Follow-up with me if symptoms are not improving or if new symptoms develop.

## 2019-03-09 ENCOUNTER — Other Ambulatory Visit: Payer: Self-pay | Admitting: Physician Assistant

## 2019-03-09 NOTE — Telephone Encounter (Signed)
Last refill:02/08/19 #45, 0 Last OV:02/28/19 acute appt

## 2019-03-28 ENCOUNTER — Other Ambulatory Visit: Payer: Self-pay | Admitting: Physician Assistant

## 2019-03-28 DIAGNOSIS — I10 Essential (primary) hypertension: Secondary | ICD-10-CM

## 2019-04-06 ENCOUNTER — Other Ambulatory Visit: Payer: Self-pay | Admitting: Physician Assistant

## 2019-04-06 NOTE — Telephone Encounter (Signed)
Xanax last rx 03/09/19 #45 LOV: 02/28/19 acute eczema CSC: 05/25/18 UDS: 05/21/16

## 2019-04-26 ENCOUNTER — Telehealth: Payer: Self-pay | Admitting: Physician Assistant

## 2019-04-26 ENCOUNTER — Encounter: Payer: Self-pay | Admitting: Gastroenterology

## 2019-04-26 DIAGNOSIS — Z1211 Encounter for screening for malignant neoplasm of colon: Secondary | ICD-10-CM

## 2019-04-26 NOTE — Addendum Note (Signed)
Addended by: Davis Gourd on: 04/26/2019 02:17 PM   Modules accepted: Orders

## 2019-04-26 NOTE — Telephone Encounter (Signed)
Per HM shows pt was due for colonoscopy 10/13/2017. Ok to place referral?

## 2019-04-26 NOTE — Telephone Encounter (Signed)
Pt called in asking for a referral to GI to have a colonoscopy. Pt can be reached at the home #

## 2019-04-26 NOTE — Telephone Encounter (Signed)
Ok to place

## 2019-04-26 NOTE — Telephone Encounter (Signed)
Referral placed.

## 2019-05-04 ENCOUNTER — Other Ambulatory Visit: Payer: Self-pay | Admitting: Physician Assistant

## 2019-05-20 ENCOUNTER — Other Ambulatory Visit: Payer: Self-pay

## 2019-05-20 ENCOUNTER — Ambulatory Visit (AMBULATORY_SURGERY_CENTER): Payer: Self-pay | Admitting: *Deleted

## 2019-05-20 VITALS — Temp 97.1°F | Ht 75.0 in | Wt 270.0 lb

## 2019-05-20 DIAGNOSIS — Z1211 Encounter for screening for malignant neoplasm of colon: Secondary | ICD-10-CM

## 2019-05-20 MED ORDER — PEG 3350-KCL-NA BICARB-NACL 420 G PO SOLR: 4000.0000 mL | Freq: Once | ORAL | 0 refills | Status: AC

## 2019-05-20 NOTE — Progress Notes (Signed)
No egg or soy allergy known to patient  No issues with past sedation with any surgeries  or procedures, no intubation problems  No diet pills per patient No home 02 use per patient  No blood thinners per patient  Pt denies issues with constipation  No A fib or A flutter  EMMI video sent to pt's e mail   In person PV today - pt refused mask- instructed her has to wear a mask for the procedure 8-21- as well as the care partner- PV nurse with mask in PV today

## 2019-06-01 ENCOUNTER — Other Ambulatory Visit: Payer: Self-pay | Admitting: Physician Assistant

## 2019-06-02 ENCOUNTER — Other Ambulatory Visit: Payer: Self-pay | Admitting: Physician Assistant

## 2019-06-02 ENCOUNTER — Telehealth: Payer: Self-pay

## 2019-06-02 NOTE — Telephone Encounter (Signed)
ast refill: 7.23.20 #45, 0  Last OV: 5.18.20 dx. eczema

## 2019-06-02 NOTE — Telephone Encounter (Signed)
Covid-19 screening questions   Do you now or have you had a fever in the last 14 days? NO  Do you have any respiratory symptoms of shortness of breath or cough now or in the last 14 days? NO  Do you have any family members or close contacts with diagnosed or suspected Covid-19 in the past 14 days? NO  Have you been tested for Covid-19 and found to be positive? NO     Confirmed with patient   

## 2019-06-03 ENCOUNTER — Ambulatory Visit (AMBULATORY_SURGERY_CENTER): Payer: Medicare Other | Admitting: Gastroenterology

## 2019-06-03 ENCOUNTER — Other Ambulatory Visit: Payer: Self-pay

## 2019-06-03 ENCOUNTER — Encounter: Payer: Self-pay | Admitting: Gastroenterology

## 2019-06-03 VITALS — BP 141/100 | HR 84 | Temp 98.7°F | Resp 14 | Ht 75.0 in | Wt 265.0 lb

## 2019-06-03 DIAGNOSIS — D128 Benign neoplasm of rectum: Secondary | ICD-10-CM

## 2019-06-03 DIAGNOSIS — D124 Benign neoplasm of descending colon: Secondary | ICD-10-CM

## 2019-06-03 DIAGNOSIS — D129 Benign neoplasm of anus and anal canal: Secondary | ICD-10-CM

## 2019-06-03 DIAGNOSIS — Z86010 Personal history of colonic polyps: Secondary | ICD-10-CM

## 2019-06-03 DIAGNOSIS — D120 Benign neoplasm of cecum: Secondary | ICD-10-CM | POA: Diagnosis not present

## 2019-06-03 DIAGNOSIS — D123 Benign neoplasm of transverse colon: Secondary | ICD-10-CM

## 2019-06-03 DIAGNOSIS — K635 Polyp of colon: Secondary | ICD-10-CM

## 2019-06-03 DIAGNOSIS — Z1211 Encounter for screening for malignant neoplasm of colon: Secondary | ICD-10-CM

## 2019-06-03 DIAGNOSIS — D125 Benign neoplasm of sigmoid colon: Secondary | ICD-10-CM

## 2019-06-03 MED ORDER — SODIUM CHLORIDE 0.9 % IV SOLN: 500.0000 mL | Freq: Once | INTRAVENOUS | Status: DC

## 2019-06-03 NOTE — Progress Notes (Signed)
Pt's states no medical or surgical changes since previsit or office visit. 

## 2019-06-03 NOTE — Progress Notes (Signed)
Called to room to assist during endoscopic procedure.  Patient ID and intended procedure confirmed with present staff. Received instructions for my participation in the procedure from the performing physician.  

## 2019-06-03 NOTE — Op Note (Signed)
[facility] Patient Name: Wayne Rhodes Procedure Date: 06/03/2019 9:31 AM MRN: [id number] Endoscopist: Justice Britain , MD Age: 63 Referring MD:  Date of Birth: [birth year] Gender: Male Account #: [id number] Procedure:                Colonoscopy Indications:              High risk colon cancer surveillance: Personal                            history of colonic polyps, Last colonoscopy >10                            years ago Medicines:                Monitored Anesthesia Care Procedure:                Pre-Anesthesia Assessment:                           - Prior to the procedure, a History and Physical                            was performed, and patient medications and                            allergies were reviewed. The patient's tolerance of                            previous anesthesia was also reviewed. The risks                            and benefits of the procedure and the sedation                            options and risks were discussed with the patient.                            All questions were answered, and informed consent                            was obtained. Prior Anticoagulants: The patient has                            taken no previous anticoagulant or antiplatelet                            agents. ASA Grade Assessment: II - A patient with                            mild systemic disease. After reviewing the risks                            and benefits, the patient was deemed in  satisfactory condition to undergo the procedure.                           After obtaining informed consent, the colonoscope                            was passed under direct vision. Throughout the                            procedure, the patient's blood pressure, pulse, and                            oxygen saturations were monitored continuously. The                            Colonoscope was introduced through the anus and                             advanced to the 5 cm into the ileum. The                            colonoscopy was performed without difficulty. The                            patient tolerated the procedure. The quality of the                            bowel preparation was good. The terminal ileum,                            ileocecal valve, appendiceal orifice, and rectum                            were photographed. Scope In: 9:41:48 AM Scope Out: 10:09:28 AM Scope Withdrawal Time: 0 hours 22 minutes 8 seconds  Total Procedure Duration: 0 hours 27 minutes 40 seconds  Findings:                 The digital rectal exam findings include                            hemorrhoids. Pertinent negatives include no                            palpable rectal lesions.                           The terminal ileum and ileocecal valve appeared                            normal.                           Nine sessile polyps were found in the rectum (2),  sigmoid colon (2), descending colon (2), splenic                            flexure (1), transverse colon (1) and cecum (2).                            The polyps were 2 to 8 mm in size. These polyps                            were removed with a cold snare. Resection and                            retrieval were complete.                           Multiple small and large-mouthed diverticula were                            found in the entire colon.                           Normal mucosa was found in the entire colon                            otherwise.                           Non-bleeding non-thrombosed internal hemorrhoids                            were found during retroflexion, during perianal                            exam and during digital exam. The hemorrhoids were                            Grade II (internal hemorrhoids that prolapse but                            reduce spontaneously). Complications:            No  immediate complications. Estimated Blood Loss:     Estimated blood loss was minimal. Impression:               - Hemorrhoids found on digital rectal exam.                           - The examined portion of the ileum was normal.                           - Nine, 2 to 8 mm polyps in the rectum, in the                            sigmoid colon, in the descending colon, at the  splenic flexure, in the transverse colon and in the                            cecum, removed with a cold snare. Resected and                            retrieved.                           - Diverticulosis in the entire examined colon.                           - Normal mucosa in the entire examined colon                            otherwise.                           - Non-bleeding non-thrombosed internal hemorrhoids. Recommendation:           - The patient will be observed post-procedure,                            until all discharge criteria are met.                           - Discharge patient to home.                           - Patient has a contact number available for                            emergencies. The signs and symptoms of potential                            delayed complications were discussed with the                            patient. Return to normal activities tomorrow.                            Written discharge instructions were provided to the                            patient.                           - High fiber diet.                           - Use FiberCon 1 tablet PO daily daily.                           - Continue present medications.                           - Await pathology results.                           -  Repeat colonoscopy in 3 years for surveillance.                           - The findings and recommendations were discussed                            with the patient. Justice Britain, MD 06/03/2019 10:16:56 AM

## 2019-06-03 NOTE — Progress Notes (Signed)
Temp per June VS per [place] 

## 2019-06-03 NOTE — Patient Instructions (Signed)
  Handouts given for polyps, diverticulosis, hemorrhoids and high fiber diet.  YOU HAD AN ENDOSCOPIC PROCEDURE TODAY AT THE [facility]:   Refer to the procedure report that was given to you for any specific questions about what was found during the examination.  If the procedure report does not answer your questions, please call your gastroenterologist to clarify.  If you requested that your care partner not be given the details of your procedure findings, then the procedure report has been included in a sealed envelope for you to review at your convenience later.  YOU SHOULD EXPECT: Some feelings of bloating in the abdomen. Passage of more gas than usual.  Walking can help get rid of the air that was put into your GI tract during the procedure and reduce the bloating. If you had a lower endoscopy (such as a colonoscopy or flexible sigmoidoscopy) you may notice spotting of blood in your stool or on the toilet paper. If you underwent a bowel prep for your procedure, you may not have a normal bowel movement for a few days.  Please Note:  You might notice some irritation and congestion in your nose or some drainage.  This is from the oxygen used during your procedure.  There is no need for concern and it should clear up in a day or so.  SYMPTOMS TO REPORT IMMEDIATELY:   Following lower endoscopy (colonoscopy or flexible sigmoidoscopy):  Excessive amounts of blood in the stool  Significant tenderness or worsening of abdominal pains  Swelling of the abdomen that is new, acute  Fever of 100F or higher   For urgent or emergent issues, a gastroenterologist can be reached at any hour by calling [phone].   DIET:  We do recommend a small meal at first, but then you may proceed to your regular diet.  Drink plenty of fluids but you should avoid alcoholic beverages for 24 hours.  ACTIVITY:  You should plan to take it easy for the rest of today and you should NOT DRIVE or use heavy  machinery until tomorrow (because of the sedation medicines used during the test).    FOLLOW UP: Our staff will call the number listed on your records 48-72 hours following your procedure to check on you and address any questions or concerns that you may have regarding the information given to you following your procedure. If we do not reach you, we will leave a message.  We will attempt to reach you two times.  During this call, we will ask if you have developed any symptoms of COVID 19. If you develop any symptoms (ie: fever, flu-like symptoms, shortness of breath, cough etc.) before then, please call [phone].  If you test positive for Covid 19 in the 2 weeks post procedure, please call and report this information to us.    If any biopsies were taken you will be contacted by phone or by letter within the next 1-3 weeks.  Please call us at [phone] if you have not heard about the biopsies in 3 weeks.    SIGNATURES/CONFIDENTIALITY: You and/or your care partner have signed paperwork which will be entered into your electronic medical record.  These signatures attest to the fact that that the information above on your After Visit Summary has been reviewed and is understood.  Full responsibility of the confidentiality of this discharge information lies with you and/or your care-partner. 

## 2019-06-03 NOTE — Progress Notes (Signed)
PT taken to PACU. Monitors in place. VSS. Report given to RN. 

## 2019-06-07 ENCOUNTER — Encounter: Payer: Self-pay | Admitting: Gastroenterology

## 2019-06-07 ENCOUNTER — Telehealth: Payer: Self-pay | Admitting: *Deleted

## 2019-06-07 ENCOUNTER — Telehealth: Payer: Self-pay

## 2019-06-07 NOTE — Telephone Encounter (Signed)
Left message on follow up call. 

## 2019-06-07 NOTE — Telephone Encounter (Signed)
  Follow up Call-  Call back number 06/03/2019  Post procedure Call Back phone  # [phone]  Permission to leave phone message Yes  Some recent data might be hidden     Patient questions:  Do you have a fever, pain , or abdominal swelling? No. Pain Score  0 *  Have you tolerated food without any problems? Yes.    Have you been able to return to your normal activities? Yes.    Do you have any questions about your discharge instructions: Diet   No. Medications  No. Follow up visit  No.  Do you have questions or concerns about your Care? No.  Actions: * If pain score is 4 or above: No action needed, pain <4.  1. Have you developed a fever since your procedure? no  2.   Have you had an respiratory symptoms (SOB or cough) since your procedure? no  3.   Have you tested positive for COVID 19 since your procedure no  4.   Have you had any family members/close contacts diagnosed with the COVID 19 since your procedure?  no   If yes to any of these questions please route to Joylene John, RN and Alphonsa Gin, Therapist, sports.

## 2019-06-22 ENCOUNTER — Other Ambulatory Visit: Payer: Self-pay | Admitting: Physician Assistant

## 2019-06-22 DIAGNOSIS — I10 Essential (primary) hypertension: Secondary | ICD-10-CM

## 2019-06-22 MED ORDER — METOPROLOL SUCCINATE ER 25 MG PO TB24: 25.0000 mg | ORAL_TABLET | Freq: Every day | ORAL | 0 refills | Status: DC

## 2019-06-22 NOTE — Addendum Note (Signed)
Addended by: Doran Clay A on: 06/22/2019 09:04 AM   Modules accepted: Orders

## 2019-06-29 ENCOUNTER — Other Ambulatory Visit: Payer: Self-pay | Admitting: Physician Assistant

## 2019-06-30 ENCOUNTER — Other Ambulatory Visit: Payer: Self-pay | Admitting: Physician Assistant

## 2019-06-30 NOTE — Telephone Encounter (Signed)
Last refill: 8.20.20 #45, 0 Last OV: 5.18.20 dx. Acute eczema on both hands

## 2019-07-14 ENCOUNTER — Telehealth: Payer: Self-pay | Admitting: Physician Assistant

## 2019-07-14 NOTE — Telephone Encounter (Signed)
Pt called noting hand pain in right hand similar to what happened in left hand back in 09/2018. Pt asked if he should have shots in hand like he did with the specialist before. Provided info and transferred pt to Dr. Levell July office to see when they have availability. Advised him to call back if they are not able to see him in a timely manner.

## 2019-07-16 ENCOUNTER — Other Ambulatory Visit: Payer: Self-pay | Admitting: Physician Assistant

## 2019-07-16 DIAGNOSIS — I10 Essential (primary) hypertension: Secondary | ICD-10-CM

## 2019-07-18 ENCOUNTER — Ambulatory Visit (INDEPENDENT_AMBULATORY_CARE_PROVIDER_SITE_OTHER): Payer: Medicare Other | Admitting: Physician Assistant

## 2019-07-18 ENCOUNTER — Encounter: Payer: Self-pay | Admitting: Physician Assistant

## 2019-07-18 ENCOUNTER — Other Ambulatory Visit: Payer: Self-pay

## 2019-07-18 VITALS — BP 122/80 | HR 108 | Temp 98.5°F | Resp 16 | Ht 75.0 in | Wt 271.0 lb

## 2019-07-18 DIAGNOSIS — M654 Radial styloid tenosynovitis [de Quervain]: Secondary | ICD-10-CM

## 2019-07-18 DIAGNOSIS — M19049 Primary osteoarthritis, unspecified hand: Secondary | ICD-10-CM | POA: Diagnosis not present

## 2019-07-18 MED ORDER — METHYLPREDNISOLONE 4 MG PO TBPK: ORAL_TABLET | ORAL | 0 refills | Status: DC

## 2019-07-18 NOTE — Progress Notes (Signed)
Patient with history of OA hands bilaterally and multiple trigger fingers with L> R presents to clinic today c/o a few days of swelling in his R hand, mainly of palmar surface of 1st MCP and thenar eminence. Denies trauma or injury. Denies numbness or tingling. Denies heavy lifting. Has not taken anything for symptoms. Has appt scheduled with his Hand Surgeon (Dr. Fredna Dow) but not for another week.   Past Medical History:  Diagnosis Date  . Anxiety    takes xanax for anxiety  . Arthritis   . Cancer ([facility])    melanoma on nose   . GERD (gastroesophageal reflux disease)   . Hyperlipidemia   . Hypertension   . Pneumonia    had pneumonia 4-5 months ago  . Recurrent upper respiratory infection (URI)    recent chest cold - treated with mucinex and cough medicine - now improved    Current Outpatient Medications on File Prior to Visit  Medication Sig Dispense Refill  . ALPRAZolam (XANAX) 1 MG tablet TAKE 1/2 TABLET BY MOUTH 3 TIMES A DAY AS NEEDED FOR ANXIETY 45 tablet 0  . chlorthalidone (HYGROTON) 25 MG tablet TAKE 1 TABLET BY MOUTH EVERY DAY 90 tablet 1  . EPINEPHrine (EPI-PEN) 0.3 mg/0.3 mL DEVI Inject 0.3 mg into the muscle as needed. For anaphylaxis    . L-Methylfolate 15 MG TABS Take 1 tablet by mouth daily.    . metoprolol succinate (TOPROL-XL) 25 MG 24 hr tablet TAKE 1 TABLET BY MOUTH DAILY. PLEASE CALL TO SCHEDULE FOLLOW UP APPT FOR REFILLS [phone] 15 tablet 0  . oxyCODONE (ROXICODONE) 15 MG immediate release tablet Take 15 mg by mouth every 6 (six) hours as needed (Dr. Nelva Bush).     . potassium chloride SA (KLOR-CON M20) 20 MEQ tablet TAKE 1 TABLET BY MOUTH EVERY DAY 90 tablet 1  . sildenafil (REVATIO) 20 MG tablet Take 1-2 tablets as needed 30 minutes prior to sexual activity.    Marland Kitchen triamcinolone ointment (KENALOG) 0.5 % APPLY TO AFFECTED AREA TWICE A DAY 30 g 0  . [DISCONTINUED] buPROPion (WELLBUTRIN XL) 150 MG 24 hr tablet Take 150 mg by mouth daily.    . [DISCONTINUED]  diphenhydrAMINE (BENADRYL) 25 MG tablet Take 25 mg by mouth 2 (two) times daily as needed. For swelling     No current facility-administered medications on file prior to visit.     Allergies  Allergen Reactions  . Clonazepam     Angioedema   . Quinapril     Angioedema   . Sertraline Hcl Other (See Comments)    Suicidal thoughts    Family History  Problem Relation Age of Onset  . Cancer Father        prostate  . Colon cancer Neg Hx   . Colon polyps Neg Hx   . Esophageal cancer Neg Hx   . Rectal cancer Neg Hx   . Stomach cancer Neg Hx     Social History   Socioeconomic History  . Marital status: Single    Spouse name: Not on file  . Number of children: Not on file  . Years of education: Not on file  . Highest education level: Not on file  Occupational History  . Not on file  Social Needs  . Financial resource strain: Not on file  . Food insecurity    Worry: Not on file    Inability: Not on file  . Transportation needs    Medical: Not on file  Non-medical: Not on file  Tobacco Use  . Smoking status: Never Smoker  . Smokeless tobacco: Never Used  Substance and Sexual Activity  . Alcohol use: Yes    Alcohol/week: 1.0 standard drinks    Types: 1 Cans of beer per week    Comment: occasional beer  . Drug use: No  . Sexual activity: Yes  Lifestyle  . Physical activity    Days per week: Not on file    Minutes per session: Not on file  . Stress: Not on file  Relationships  . Social Herbalist on phone: Not on file    Gets together: Not on file    Attends religious service: Not on file    Active member of club or organization: Not on file    Attends meetings of clubs or organizations: Not on file    Relationship status: Not on file  Other Topics Concern  . Not on file  Social History Narrative  . Not on file   Review of Systems - See HPI.  All other ROS are negative.  Wt 271 lb (122.9 kg)   BMI 33.87 kg/m   Physical Exam Vitals signs  reviewed.  Constitutional:      Appearance: Normal appearance.  HENT:     Head: Normocephalic and atraumatic.  Musculoskeletal:     Right hand: He exhibits normal range of motion, normal two-point discrimination and no swelling. Normal sensation noted. Decreased strength noted. He exhibits thumb/finger opposition. He exhibits no finger abduction and no wrist extension trouble.     Comments: + Finkelstein test R hand  Neurological:     General: No focal deficit present.     Mental Status: He is alert and oriented to person, place, and time.  Psychiatric:        Mood and Affect: Mood normal.     No results found for this or any previous visit (from the past 2160 hour(s)).  Assessment/Plan: 1. Arthritis of hand 2. Tenosynovitis, de Quervain R hand. Thumb spica brace applied. Tylenol OTC. Rx Medrol to help with inflammation. Follow-up with specialist as scheduled. Call if anything is worsening.  - methylPREDNISolone (MEDROL DOSEPAK) 4 MG TBPK tablet; Take following package directions  Dispense: 21 tablet; Refill: 0   Leeanne Rio, PA-C

## 2019-07-18 NOTE — Patient Instructions (Signed)
Wear the hand brace at night and when you can throughout the day.  Ice and elevate hand and arm while resting.  The steroid pack should help calm down inflammation.  Please make sure to follow-up with Dr. Fredna Dow as scheduled for further management.

## 2019-07-26 ENCOUNTER — Other Ambulatory Visit: Payer: Self-pay | Admitting: Physician Assistant

## 2019-07-26 DIAGNOSIS — I10 Essential (primary) hypertension: Secondary | ICD-10-CM

## 2019-07-27 ENCOUNTER — Other Ambulatory Visit: Payer: Self-pay | Admitting: Physician Assistant

## 2019-08-01 ENCOUNTER — Other Ambulatory Visit: Payer: Self-pay | Admitting: Family Medicine

## 2019-08-01 NOTE — Telephone Encounter (Signed)
Xanax last rx 06/30/19 #45 LOV: 07/18/19 acute CSC: 05/25/18

## 2019-08-23 ENCOUNTER — Other Ambulatory Visit: Payer: Self-pay | Admitting: Physician Assistant

## 2019-08-23 DIAGNOSIS — I10 Essential (primary) hypertension: Secondary | ICD-10-CM

## 2019-08-25 ENCOUNTER — Other Ambulatory Visit: Payer: Self-pay | Admitting: Physician Assistant

## 2019-08-25 DIAGNOSIS — I10 Essential (primary) hypertension: Secondary | ICD-10-CM

## 2019-08-25 NOTE — Telephone Encounter (Signed)
Patient is scheduled for CPE on 08/31/19

## 2019-08-31 ENCOUNTER — Ambulatory Visit (INDEPENDENT_AMBULATORY_CARE_PROVIDER_SITE_OTHER): Payer: Medicare Other

## 2019-08-31 ENCOUNTER — Other Ambulatory Visit: Payer: Self-pay | Admitting: Physician Assistant

## 2019-08-31 ENCOUNTER — Telehealth: Payer: Self-pay | Admitting: Emergency Medicine

## 2019-08-31 ENCOUNTER — Ambulatory Visit (INDEPENDENT_AMBULATORY_CARE_PROVIDER_SITE_OTHER): Payer: Medicare Other | Admitting: Physician Assistant

## 2019-08-31 ENCOUNTER — Other Ambulatory Visit: Payer: Self-pay

## 2019-08-31 VITALS — BP 118/78 | Temp 97.4°F | Ht 75.0 in | Wt 265.2 lb

## 2019-08-31 DIAGNOSIS — F339 Major depressive disorder, recurrent, unspecified: Secondary | ICD-10-CM | POA: Diagnosis not present

## 2019-08-31 DIAGNOSIS — Z0000 Encounter for general adult medical examination without abnormal findings: Secondary | ICD-10-CM

## 2019-08-31 DIAGNOSIS — Z125 Encounter for screening for malignant neoplasm of prostate: Secondary | ICD-10-CM

## 2019-08-31 DIAGNOSIS — I10 Essential (primary) hypertension: Secondary | ICD-10-CM | POA: Diagnosis not present

## 2019-08-31 DIAGNOSIS — R21 Rash and other nonspecific skin eruption: Secondary | ICD-10-CM

## 2019-08-31 LAB — CBC WITH DIFFERENTIAL/PLATELET
Basophils Absolute: 0 10*3/uL (ref 0.0–0.1)
Basophils Relative: 0.6 % (ref 0.0–3.0)
Eosinophils Absolute: 0.2 10*3/uL (ref 0.0–0.7)
Eosinophils Relative: 2 % (ref 0.0–5.0)
HCT: 52.9 % — ABNORMAL HIGH (ref 39.0–52.0)
Hemoglobin: 18.3 g/dL (ref 13.0–17.0)
Lymphocytes Relative: 17.7 % (ref 12.0–46.0)
Lymphs Abs: 1.3 10*3/uL (ref 0.7–4.0)
MCHC: 33.7 g/dL (ref 30.0–36.0)
MCV: 94.3 fl (ref 78.0–100.0)
Monocytes Absolute: 0.8 10*3/uL (ref 0.1–1.0)
Monocytes Relative: 10.6 % (ref 3.0–12.0)
Neutro Abs: 5.2 10*3/uL (ref 1.4–7.7)
Neutrophils Relative %: 69.1 % (ref 43.0–77.0)
Platelets: 242 10*3/uL (ref 150.0–400.0)
RBC: 5.72 Mil/uL (ref 4.22–5.81)
RDW: 13.6 % (ref 11.5–15.5)
WBC: 7.6 10*3/uL (ref 4.0–10.5)

## 2019-08-31 LAB — COMPREHENSIVE METABOLIC PANEL
ALT: 18 U/L (ref 0–53)
AST: 17 U/L (ref 0–37)
Albumin: 4.3 g/dL (ref 3.5–5.2)
Alkaline Phosphatase: 46 U/L (ref 39–117)
BUN: 15 mg/dL (ref 6–23)
CO2: 26 mEq/L (ref 19–32)
Calcium: 9.2 mg/dL (ref 8.4–10.5)
Chloride: 99 mEq/L (ref 96–112)
Creatinine, Ser: 1.05 mg/dL (ref 0.40–1.50)
GFR: 70.77 mL/min (ref >60.00)
Glucose, Bld: 124 mg/dL — ABNORMAL HIGH (ref 70–99)
Potassium: 3.6 mEq/L (ref 3.5–5.1)
Sodium: 138 mEq/L (ref 135–145)
Total Bilirubin: 1.2 mg/dL (ref 0.2–1.2)
Total Protein: 6.7 g/dL (ref 6.0–8.3)

## 2019-08-31 LAB — LIPID PANEL
Cholesterol: 181 mg/dL (ref 0–200)
HDL: 27.2 mg/dL — ABNORMAL LOW (ref >39.00)
NonHDL: 153.83
Total CHOL/HDL Ratio: 7
Triglycerides: 258 mg/dL — ABNORMAL HIGH (ref 0.0–149.0)
VLDL: 51.6 mg/dL — ABNORMAL HIGH (ref 0.0–40.0)

## 2019-08-31 LAB — TSH: TSH: 3.3 u[IU]/mL (ref 0.35–4.50)

## 2019-08-31 LAB — LDL CHOLESTEROL, DIRECT: Direct LDL: 103 mg/dL

## 2019-08-31 LAB — HEMOGLOBIN A1C: Hgb A1c MFr Bld: 6.2 % (ref 4.6–6.5)

## 2019-08-31 LAB — PSA: PSA: 0.79 ng/mL (ref 0.10–4.00)

## 2019-08-31 MED ORDER — PERMETHRIN 5 % EX CREA: 1.0000 "application " | TOPICAL_CREAM | Freq: Once | CUTANEOUS | 0 refills | Status: AC

## 2019-08-31 NOTE — Progress Notes (Signed)
Subjective:   Wayne Rhodes is a 63 y.o. male who presents for Medicare Annual/Subsequent preventive examination.  Review of Systems:   Cardiac Risk Factors include: advanced age (>58men, >47 women);male gender;hypertension    Objective:    Vitals: BP 118/78 (BP Location: Left Arm, Patient Position: Sitting, Cuff Size: Large)   Temp (!) 97.4 F (36.3 C) (Temporal)   Ht 6\' 3"  (1.905 m)   Wt 265 lb 3.2 oz (120.3 kg)   BMI 33.15 kg/m   Body mass index is 33.15 kg/m.  Advanced Directives 08/31/2019 09/26/2011 09/25/2011 09/17/2011  Does Patient Have a Medical Advance Directive? No Patient would not like information Patient does not have advance directive;Patient would not like information Patient does not have advance directive  Would patient like information on creating a medical advance directive? Yes (MAU/Ambulatory/Procedural Areas - Information given) - - -  Pre-existing out of facility DNR order (yellow form or pink MOST form) - No No -    Tobacco Social History   Tobacco Use  Smoking Status Never Smoker  Smokeless Tobacco Never Used     Counseling given: Not Answered   Clinical Intake:  Pre-visit preparation completed: Yes  Pain : No/denies pain  Diabetes: No  How often do you need to have someone help you when you read instructions, pamphlets, or other written materials from your doctor or pharmacy?: 1 - Never  Interpreter Needed?: No  Information entered by :: Denman George LPN  Past Medical History:  Diagnosis Date  . Anxiety    takes xanax for anxiety  . Arthritis   . Cancer ([place])    melanoma on nose   . GERD (gastroesophageal reflux disease)   . Hyperlipidemia   . Hypertension   . Pneumonia    had pneumonia 4-5 months ago  . Recurrent upper respiratory infection (URI)    recent chest cold - treated with mucinex and cough medicine - now improved   Past Surgical History:  Procedure Laterality Date  . ANTERIOR CERVICAL DECOMP/DISCECTOMY  FUSION  09/25/2011   Procedure: ANTERIOR CERVICAL DECOMPRESSION/DISCECTOMY FUSION 1 LEVEL;  Surgeon: Dahlia Bailiff;  Location: [facility];  Service: Orthopedics;  Laterality: N/A;  ACDF Cervical 5-6  . COLONOSCOPY    . ELBOW SURGERY     both elbow surgery  . KNEE ARTHROSCOPY     left   Family History  Problem Relation Age of Onset  . Cancer Father        prostate  . Colon cancer Neg Hx   . Colon polyps Neg Hx   . Esophageal cancer Neg Hx   . Rectal cancer Neg Hx   . Stomach cancer Neg Hx    Social History   Socioeconomic History  . Marital status: Single    Spouse name: Not on file  . Number of children: 2  . Years of education: Not on file  . Highest education level: Not on file  Occupational History  . Occupation: Retired    Comment: Corporate treasurer  Social Needs  . Financial resource strain: Not on file  . Food insecurity    Worry: Not on file    Inability: Not on file  . Transportation needs    Medical: Not on file    Non-medical: Not on file  Tobacco Use  . Smoking status: Never Smoker  . Smokeless tobacco: Never Used  Substance and Sexual Activity  . Alcohol use: Yes    Alcohol/week: 1.0 standard drinks  Types: 1 Cans of beer per week    Comment: occasional beer  . Drug use: No  . Sexual activity: Yes  Lifestyle  . Physical activity    Days per week: Not on file    Minutes per session: Not on file  . Stress: Not on file  Relationships  . Social Herbalist on phone: Not on file    Gets together: Not on file    Attends religious service: Not on file    Active member of club or organization: Not on file    Attends meetings of clubs or organizations: Not on file    Relationship status: Not on file  Other Topics Concern  . Not on file  Social History Narrative  . Not on file    Outpatient Encounter Medications as of 08/31/2019  Medication Sig  . ALPRAZolam (XANAX) 1 MG tablet TAKE 1/2 TABLET BY MOUTH 3 TIMES A DAY AS NEEDED FOR ANXIETY   . chlorthalidone (HYGROTON) 25 MG tablet TAKE 1 TABLET BY MOUTH EVERY DAY  . EPINEPHrine (EPI-PEN) 0.3 mg/0.3 mL DEVI Inject 0.3 mg into the muscle as needed. For anaphylaxis  . L-Methylfolate 15 MG TABS Take 1 tablet by mouth daily.  . methylPREDNISolone (MEDROL DOSEPAK) 4 MG TBPK tablet Take following package directions  . metoprolol succinate (TOPROL-XL) 25 MG 24 hr tablet TAKE 1 TABLET BY MOUTH EVERY DAY  . oxyCODONE (ROXICODONE) 15 MG immediate release tablet Take 15 mg by mouth every 6 (six) hours as needed (Dr. Nelva Bush).   . potassium chloride SA (KLOR-CON M20) 20 MEQ tablet TAKE 1 TABLET BY MOUTH EVERY DAY  . sildenafil (REVATIO) 20 MG tablet Take 1-2 tablets as needed 30 minutes prior to sexual activity.  Marland Kitchen triamcinolone ointment (KENALOG) 0.5 % APPLY TO AFFECTED AREA TWICE A DAY  . [DISCONTINUED] buPROPion (WELLBUTRIN XL) 150 MG 24 hr tablet Take 150 mg by mouth daily.  . [DISCONTINUED] diphenhydrAMINE (BENADRYL) 25 MG tablet Take 25 mg by mouth 2 (two) times daily as needed. For swelling   No facility-administered encounter medications on file as of 08/31/2019.     Activities of Daily Living In your present state of health, do you have any difficulty performing the following activities: 08/31/2019 02/28/2019  Hearing? N N  Vision? N N  Difficulty concentrating or making decisions? N N  Walking or climbing stairs? N N  Comment at times due to back pain -  Dressing or bathing? N N  Doing errands, shopping? N N  Preparing Food and eating ? N -  Using the Toilet? N -  In the past six months, have you accidently leaked urine? N -  Do you have problems with loss of bowel control? N -  Managing your Medications? N -  Managing your Finances? N -  Some recent data might be hidden    Patient Care Team: Delorse Limber as PCP - General (Family Medicine) Suella Broad, MD as Consulting Physician (Physical Medicine and Rehabilitation)   Assessment:   This is a routine  wellness examination for [facility].  Exercise Activities and Dietary recommendations Current Exercise Habits: The patient does not participate in regular exercise at present  Goals   None     Fall Risk Fall Risk  08/31/2019 02/28/2019 12/26/2016  Falls in the past year? 0 0 No  Number falls in past yr: - 0 -  Injury with Fall? 0 0 -  Follow up Falls evaluation completed;Education provided;Falls prevention discussed  Falls evaluation completed -   Is the patient's home free of loose throw rugs in walkways, pet beds, electrical cords, etc?   yes      Grab bars in the bathroom? yes      Handrails on the stairs?   yes      Adequate lighting?   yes  Timed Get Up and Go Performed: completed and within normal timeframe; no gait abnormalities noted   Depression Screen PHQ 2/9 Scores 07/18/2019 02/28/2019 06/10/2018 05/11/2018  PHQ - 2 Score 2 0 0 0  PHQ- 9 Score - 0 0 -    Cognitive Function- no cognitive concerns at this time   Alert? Yes         Normal Appearance?  Yes  Oriented to person? Yes           Place? Yes  Time? Yes  Recall of three objects? Yes  Can perform simple calculations? Yes  Displays appropriate judgment? Yes  Can read the correct time from a watch face? Yes    There is no immunization history on file for this patient.  Qualifies for Shingles Vaccine? Discussed and patient will check with pharmacy for coverage.  Patient education handout provided   Screening Tests Health Maintenance  Topic Date Due  . DTaP/Tdap/Td (1 - Tdap) 02/16/1973  . TETANUS/TDAP  02/16/1973  . PNA vac Low Risk Adult (1 of 2 - PCV13) 02/17/2019  . Hepatitis C Screening  02/28/2020 (Originally [birth year])  . HIV Screening  07/17/2020 (Originally 02/16/1971)  . INFLUENZA VACCINE  12/19/2087 (Originally 05/14/2019)  . COLONOSCOPY  06/02/2022   Cancer Screenings: Lung: Low Dose CT Chest recommended if Age 24-80 years, 30 pack-year currently smoking OR have quit w/in 15years. Patient does not  qualify. Colorectal: colonoscopy 06/03/19 with Dr. Rush Landmark; repeat 10 years    Plan:  I have personally reviewed and addressed the Medicare Annual Wellness questionnaire and have noted the following in the patient's chart:  A. Medical and social history B. Use of alcohol, tobacco or illicit drugs  C. Current medications and supplements D. Functional ability and status E.  Nutritional status F.  Physical activity G. Advance directives H. List of other physicians I.  Hospitalizations, surgeries, and ER visits in previous 12 months J.  [place] such as hearing and vision if needed, cognitive and depression L. Referrals, records requested, and appointments- none   In addition, I have reviewed and discussed with patient certain preventive protocols, quality metrics, and best practice recommendations. A written personalized care plan for preventive services as well as general preventive health recommendations were provided to patient.   Signed,  Denman George, LPN  Nurse Health Advisor   Nurse Notes: Patient declines all vaccines at this time

## 2019-08-31 NOTE — Patient Instructions (Signed)
Wayne Rhodes , Thank you for taking time to come for your Medicare Wellness Visit. I appreciate your ongoing commitment to your health goals. Please review the following plan we discussed and let me know if I can assist you in the future.   Screening recommendations/referrals: Colorectal Screening: up to date; last 06/03/19 with Dr. Rush Landmark   Vision and Dental Exams: Recommended annual ophthalmology exams for early detection of glaucoma and other disorders of the eye Recommended annual dental exams for proper oral hygiene  Vaccinations: Influenza vaccine:  recommended this fall either at PCP office or through your local pharmacy  Pneumococcal vaccine: recommended  Tdap vaccine: recommended;  Please call your insurance company to determine your out of pocket expense. You may receive this vaccine at your local pharmacy or Health Dept. Shingles vaccine: Please call your insurance company to determine your out of pocket expense for the Shingrix vaccine. You may receive this vaccine at your local pharmacy.  Advanced directives: Advance directives discussed with you today. I have provided a copy for you to complete at home and have notarized. Once this is complete please bring a copy in to our office so we can scan it into your chart.  Goals: Recommend to drink at least 6-8 8oz glasses of water per day and consume a balanced diet rich in fresh fruits and vegetables.   Next appointment: Please schedule your Annual Wellness Visit with your Nurse Health Advisor in one year.  Preventive Care 37 Years and Older, Male Preventive care refers to lifestyle choices and visits with your health care provider that can promote health and wellness. What does preventive care include?  A yearly physical exam. This is also called an annual well check.  Dental exams once or twice a year.  Routine eye exams. Ask your health care provider how often you should have your eyes checked.  Personal lifestyle choices,  including:  Daily care of your teeth and gums.  Regular physical activity.  Eating a healthy diet.  Avoiding tobacco and drug use.  Limiting alcohol use.  Practicing safe sex.  Taking low doses of aspirin every day if recommended by your health care provider..  Taking vitamin and mineral supplements as recommended by your health care provider. What happens during an annual well check? The services and screenings done by your health care provider during your annual well check will depend on your age, overall health, lifestyle risk factors, and family history of disease. Counseling  Your health care provider may ask you questions about your:  Alcohol use.  Tobacco use.  Drug use.  Emotional well-being.  Home and relationship well-being.  Sexual activity.  Eating habits.  History of falls.  Memory and ability to understand (cognition).  Work and work Statistician. Screening  You may have the following tests or measurements:  Height, weight, and BMI.  Blood pressure.  Lipid and cholesterol levels. These may be checked every 5 years, or more frequently if you are over 89 years old.  Skin check.  Lung cancer screening. You may have this screening every year starting at age 63 if you have a 30-pack-year history of smoking and currently smoke or have quit within the past 15 years.  Fecal occult blood test (FOBT) of the stool. You may have this test every year starting at age 29.  Flexible sigmoidoscopy or colonoscopy. You may have a sigmoidoscopy every 5 years or a colonoscopy every 10 years starting at age 62.  Prostate cancer screening. Recommendations will vary depending  on your family history and other risks.  Hepatitis C blood test.  Hepatitis B blood test.  Sexually transmitted disease (STD) testing.  Diabetes screening. This is done by checking your blood sugar (glucose) after you have not eaten for a while (fasting). You may have this done every 1-3  years.  Abdominal aortic aneurysm (AAA) screening. You may need this if you are a current or former smoker.  Osteoporosis. You may be screened starting at age 30 if you are at high risk. Talk with your health care provider about your test results, treatment options, and if necessary, the need for more tests. Vaccines  Your health care provider may recommend certain vaccines, such as:  Influenza vaccine. This is recommended every year.  Tetanus, diphtheria, and acellular pertussis (Tdap, Td) vaccine. You may need a Td booster every 10 years.  Zoster vaccine. You may need this after age 23.  Pneumococcal 13-valent conjugate (PCV13) vaccine. One dose is recommended after age 1.  Pneumococcal polysaccharide (PPSV23) vaccine. One dose is recommended after age 19. Talk to your health care provider about which screenings and vaccines you need and how often you need them. This information is not intended to replace advice given to you by your health care provider. Make sure you discuss any questions you have with your health care provider. Document Released: 10/26/2015 Document Revised: 06/18/2016 Document Reviewed: 07/31/2015 Elsevier Interactive Patient Education  2017 [place] Prevention in the Home Falls can cause injuries. They can happen to people of all ages. There are many things you can do to make your home safe and to help prevent falls. What can I do on the outside of my home?  Regularly fix the edges of walkways and driveways and fix any cracks.  Remove anything that might make you trip as you walk through a door, such as a raised step or threshold.  Trim any bushes or trees on the path to your home.  Use bright outdoor lighting.  Clear any walking paths of anything that might make someone trip, such as rocks or tools.  Regularly check to see if handrails are loose or broken. Make sure that both sides of any steps have handrails.  Any raised decks and porches  should have guardrails on the edges.  Have any leaves, snow, or ice cleared regularly.  Use sand or salt on walking paths during winter.  Clean up any spills in your garage right away. This includes oil or grease spills. What can I do in the bathroom?  Use night lights.  Install grab bars by the toilet and in the tub and shower. Do not use towel bars as grab bars.  Use non-skid mats or decals in the tub or shower.  If you need to sit down in the shower, use a plastic, non-slip stool.  Keep the floor dry. Clean up any water that spills on the floor as soon as it happens.  Remove soap buildup in the tub or shower regularly.  Attach bath mats securely with double-sided non-slip rug tape.  Do not have throw rugs and other things on the floor that can make you trip. What can I do in the bedroom?  Use night lights.  Make sure that you have a light by your bed that is easy to reach.  Do not use any sheets or blankets that are too big for your bed. They should not hang down onto the floor.  Have a firm chair that has side arms.  You can use this for support while you get dressed.  Do not have throw rugs and other things on the floor that can make you trip. What can I do in the kitchen?  Clean up any spills right away.  Avoid walking on wet floors.  Keep items that you use a lot in easy-to-reach places.  If you need to reach something above you, use a strong step stool that has a grab bar.  Keep electrical cords out of the way.  Do not use floor polish or wax that makes floors slippery. If you must use wax, use non-skid floor wax.  Do not have throw rugs and other things on the floor that can make you trip. What can I do with my stairs?  Do not leave any items on the stairs.  Make sure that there are handrails on both sides of the stairs and use them. Fix handrails that are broken or loose. Make sure that handrails are as long as the stairways.  Check any carpeting to  make sure that it is firmly attached to the stairs. Fix any carpet that is loose or worn.  Avoid having throw rugs at the top or bottom of the stairs. If you do have throw rugs, attach them to the floor with carpet tape.  Make sure that you have a light switch at the top of the stairs and the bottom of the stairs. If you do not have them, ask someone to add them for you. What else can I do to help prevent falls?  Wear shoes that:  Do not have high heels.  Have rubber bottoms.  Are comfortable and fit you well.  Are closed at the toe. Do not wear sandals.  If you use a stepladder:  Make sure that it is fully opened. Do not climb a closed stepladder.  Make sure that both sides of the stepladder are locked into place.  Ask someone to hold it for you, if possible.  Clearly mark and make sure that you can see:  Any grab bars or handrails.  First and last steps.  Where the edge of each step is.  Use tools that help you move around (mobility aids) if they are needed. These include:  Canes.  Walkers.  Scooters.  Crutches.  Turn on the lights when you go into a dark area. Replace any light bulbs as soon as they burn out.  Set up your furniture so you have a clear path. Avoid moving your furniture around.  If any of your floors are uneven, fix them.  If there are any pets around you, be aware of where they are.  Review your medicines with your doctor. Some medicines can make you feel dizzy. This can increase your chance of falling. Ask your doctor what other things that you can do to help prevent falls. This information is not intended to replace advice given to you by your health care provider. Make sure you discuss any questions you have with your health care provider. Document Released: 07/26/2009 Document Revised: 03/06/2016 Document Reviewed: 11/03/2014 Elsevier Interactive Patient Education  2017 [employer].

## 2019-08-31 NOTE — Progress Notes (Signed)
Patient presents to clinic today for annual exam.  Patient is fasting for labs.  Acute Concerns: Patient endorses rash first noted this morning on waking. Not sure how long may have been present. Is of lower back and sides of torso bilaterally. Some lesions of lower chest. Notes they seem like red lines. Denies pain or pruritus. Denies change to soaps, lotions, detergents. Denies recent travel or sick contact. Denies known exposure to chemical or plant. Has never had rash like this before.  Health Maintenance: Immunizations -- Tetanus booster due. Medicare does not pay as preventive. Discussed indication for booster.  Colonoscopy -- UTD  Past Medical History:  Diagnosis Date  . Anxiety    takes xanax for anxiety  . Arthritis   . Cancer ([place])    melanoma on nose   . GERD (gastroesophageal reflux disease)   . Hyperlipidemia   . Hypertension   . Pneumonia    had pneumonia 4-5 months ago  . Recurrent upper respiratory infection (URI)    recent chest cold - treated with mucinex and cough medicine - now improved    Past Surgical History:  Procedure Laterality Date  . ANTERIOR CERVICAL DECOMP/DISCECTOMY FUSION  09/25/2011   Procedure: ANTERIOR CERVICAL DECOMPRESSION/DISCECTOMY FUSION 1 LEVEL;  Surgeon: Dahlia Bailiff;  Location: [facility];  Service: Orthopedics;  Laterality: N/A;  ACDF Cervical 5-6  . COLONOSCOPY    . ELBOW SURGERY     both elbow surgery  . KNEE ARTHROSCOPY     left    Current Outpatient Medications on File Prior to Visit  Medication Sig Dispense Refill  . ALPRAZolam (XANAX) 1 MG tablet TAKE 1/2 TABLET BY MOUTH 3 TIMES A DAY AS NEEDED FOR ANXIETY 45 tablet 0  . chlorthalidone (HYGROTON) 25 MG tablet TAKE 1 TABLET BY MOUTH EVERY DAY 90 tablet 1  . EPINEPHrine (EPI-PEN) 0.3 mg/0.3 mL DEVI Inject 0.3 mg into the muscle as needed. For anaphylaxis    . L-Methylfolate 15 MG TABS Take 1 tablet by mouth daily.    . methylPREDNISolone (MEDROL DOSEPAK) 4 MG TBPK tablet  Take following package directions 21 tablet 0  . metoprolol succinate (TOPROL-XL) 25 MG 24 hr tablet TAKE 1 TABLET BY MOUTH EVERY DAY 90 tablet 0  . oxyCODONE (ROXICODONE) 15 MG immediate release tablet Take 15 mg by mouth every 6 (six) hours as needed (Dr. Nelva Bush).     . potassium chloride SA (KLOR-CON M20) 20 MEQ tablet TAKE 1 TABLET BY MOUTH EVERY DAY 90 tablet 1  . sildenafil (REVATIO) 20 MG tablet Take 1-2 tablets as needed 30 minutes prior to sexual activity.    Marland Kitchen triamcinolone ointment (KENALOG) 0.5 % APPLY TO AFFECTED AREA TWICE A DAY 30 g 0  . [DISCONTINUED] buPROPion (WELLBUTRIN XL) 150 MG 24 hr tablet Take 150 mg by mouth daily.    . [DISCONTINUED] diphenhydrAMINE (BENADRYL) 25 MG tablet Take 25 mg by mouth 2 (two) times daily as needed. For swelling     No current facility-administered medications on file prior to visit.     Allergies  Allergen Reactions  . Clonazepam     Angioedema   . Quinapril     Angioedema   . Sertraline Hcl Other (See Comments)    Suicidal thoughts    Family History  Problem Relation Age of Onset  . Cancer Father        prostate  . Colon cancer Neg Hx   . Colon polyps Neg Hx   . Esophageal  cancer Neg Hx   . Rectal cancer Neg Hx   . Stomach cancer Neg Hx     Social History   Socioeconomic History  . Marital status: Single    Spouse name: Not on file  . Number of children: 2  . Years of education: Not on file  . Highest education level: Not on file  Occupational History  . Occupation: Retired    Comment: Corporate treasurer  Social Needs  . Financial resource strain: Not on file  . Food insecurity    Worry: Not on file    Inability: Not on file  . Transportation needs    Medical: Not on file    Non-medical: Not on file  Tobacco Use  . Smoking status: Never Smoker  . Smokeless tobacco: Never Used  Substance and Sexual Activity  . Alcohol use: Yes    Alcohol/week: 1.0 standard drinks    Types: 1 Cans of beer per week     Comment: occasional beer  . Drug use: No  . Sexual activity: Yes  Lifestyle  . Physical activity    Days per week: Not on file    Minutes per session: Not on file  . Stress: Not on file  Relationships  . Social Herbalist on phone: Not on file    Gets together: Not on file    Attends religious service: Not on file    Active member of club or organization: Not on file    Attends meetings of clubs or organizations: Not on file    Relationship status: Not on file  . Intimate partner violence    Fear of current or ex partner: Not on file    Emotionally abused: Not on file    Physically abused: Not on file    Forced sexual activity: Not on file  Other Topics Concern  . Not on file  Social History Narrative  . Not on file    Review of Systems  Constitutional: Negative for fever and weight loss.  HENT: Negative for ear discharge, ear pain, hearing loss and tinnitus.   Eyes: Negative for blurred vision, double vision, photophobia and pain.  Respiratory: Negative for cough and shortness of breath.   Cardiovascular: Negative for chest pain and palpitations.  Gastrointestinal: Negative for abdominal pain, blood in stool, constipation, diarrhea, heartburn, melena, nausea and vomiting.  Genitourinary: Negative for dysuria, flank pain, frequency, hematuria and urgency.  Musculoskeletal: Negative for falls.  Skin: Positive for itching and rash.  Neurological: Negative for dizziness, loss of consciousness and headaches.  Endo/Heme/Allergies: Negative for environmental allergies.  Psychiatric/Behavioral: Negative for depression, hallucinations, substance abuse and suicidal ideas. The patient is not nervous/anxious and does not have insomnia.     BP 118/78 (BP Location: Left Arm, Patient Position: Sitting, Cuff Size: Large)   Temp (!) 97.4 F (36.3 C) (Temporal)   Ht _0  (1.905 m)   Wt 265 lb 3.2 oz (120.3 kg)   BMI 33.15 kg/m   Physical Exam Vitals signs reviewed.   Constitutional:      General: He is not in acute distress.    Appearance: He is well-developed. He is not diaphoretic.  HENT:     Head: Normocephalic and atraumatic.     Right Ear: Tympanic membrane, ear canal and external ear normal.     Left Ear: Tympanic membrane, ear canal and external ear normal.     Nose: Nose normal.     Mouth/Throat:  Pharynx: No posterior oropharyngeal erythema.  Eyes:     Conjunctiva/sclera: Conjunctivae normal.     Pupils: Pupils are equal, round, and reactive to light.  Neck:     Musculoskeletal: Neck supple.     Thyroid: No thyromegaly.  Cardiovascular:     Rate and Rhythm: Normal rate and regular rhythm.     Heart sounds: Normal heart sounds.  Pulmonary:     Effort: Pulmonary effort is normal. No respiratory distress.     Breath sounds: Normal breath sounds. No wheezing or rales.  Chest:     Chest wall: No tenderness.  Abdominal:     General: Bowel sounds are normal. There is no distension.     Palpations: Abdomen is soft. There is no mass.     Tenderness: There is no abdominal tenderness. There is no guarding or rebound.  Lymphadenopathy:     Cervical: No cervical adenopathy.  Skin:    General: Skin is warm and dry.     Findings: No rash.       Neurological:     Mental Status: He is alert and oriented to person, place, and time.     Cranial Nerves: No cranial nerve deficit.    Assessment/Plan: 1. Visit for preventive health examination Depression screen negative. Health Maintenance reviewed. Preventive schedule discussed and handout given in AVS. Will obtain fasting labs today. - Comp Met (CMET) - CBC w/Diff - Hemoglobin A1c - Lipid Profile - TSH  2. Prostate cancer screening The natural history of prostate cancer and ongoing controversy regarding screening and potential treatment outcomes of prostate cancer has been discussed with the patient. The meaning of a false positive PSA and a false negative PSA has been discussed. He  indicates understanding of the limitations of this screening test and wishes to proceed with screening PSA testing.  - PSA  3. Essential hypertension BP normotensive. Asymptomatic Continue current regimen. Repeat labs today. - Comp Met (CMET) - Hemoglobin A1c - Lipid Profile  4. Depression, recurrent ([place]) Stable per patient. Continue current regimen.   5. Rash Concern for mite bite due to location and pattern of rash. Rx Permethrin to apply x 1. Start OTC antihistamine. Keep skin clean and dry. Has follow-up scheduled with [facility]. Encouraged to keep this.  - permethrin (ELIMITE) 5 % cream; Apply 1 application topically once for 1 dose. Apply at bedtime. Wash off first thing in the morning  Dispense: 60 g; Refill: 0    Leeanne Rio, PA-C

## 2019-08-31 NOTE — Telephone Encounter (Signed)
CRITICAL VALUE STICKER  CRITICAL VALUE: HGB 18.3        : HCT 52.9  RECEIVER (on-site recipient of call): Eduard Clos, [place] NOTIFIED: 08/31/2019 3:50 PM  MESSENGER (representative from lab):   MD NOTIFIED: Brunetta Jeans, PA-C  TIME OF NOTIFICATION: 3:57 PM  RESPONSE:

## 2019-08-31 NOTE — Patient Instructions (Signed)
Please go to the lab for blood work.   Our office will call you with your results unless you have chosen to receive results via MyChart.  If your blood work is normal we will follow-up each year for physicals and as scheduled for chronic medical problems.  If anything is abnormal we will treat accordingly and get you in for a follow-up.  Please apply the permethrin cream from the chest down (avoiding privates) at bedtime. Wash off in the morning. Keep a close eye on the rash. If things are worsening please let us know. Make sure to follow-up with Dr. Allyson Sabal as discussed. If you note itching you can take an OTC antihistamine like Claritin for this.    Preventive Care 30-40 Years Old, Male Preventive care refers to lifestyle choices and visits with your health care provider that can promote health and wellness. This includes:  A yearly physical exam. This is also called an annual well check.  Regular dental and eye exams.  Immunizations.  Screening for certain conditions.  Healthy lifestyle choices, such as eating a healthy diet, getting regular exercise, not using drugs or products that contain nicotine and tobacco, and limiting alcohol use. What can I expect for my preventive care visit? Physical exam Your health care provider will check:  Height and weight. These may be used to calculate body mass index (BMI), which is a measurement that tells if you are at a healthy weight.  Heart rate and blood pressure.  Your skin for abnormal spots. Counseling Your health care provider may ask you questions about:  Alcohol, tobacco, and drug use.  Emotional well-being.  Home and relationship well-being.  Sexual activity.  Eating habits.  Work and work Statistician. What immunizations do I need?  Influenza (flu) vaccine  This is recommended every year. Tetanus, diphtheria, and pertussis (Tdap) vaccine  You may need a Td booster every 10 years. Varicella (chickenpox) vaccine   You may need this vaccine if you have not already been vaccinated. Zoster (shingles) vaccine  You may need this after age 27. Measles, mumps, and rubella (MMR) vaccine  You may need at least one dose of MMR if you were born in 1957 or later. You may also need a second dose. Pneumococcal conjugate (PCV13) vaccine  You may need this if you have certain conditions and were not previously vaccinated. Pneumococcal polysaccharide (PPSV23) vaccine  You may need one or two doses if you smoke cigarettes or if you have certain conditions. Meningococcal conjugate (MenACWY) vaccine  You may need this if you have certain conditions. Hepatitis A vaccine  You may need this if you have certain conditions or if you travel or work in places where you may be exposed to hepatitis A. Hepatitis B vaccine  You may need this if you have certain conditions or if you travel or work in places where you may be exposed to hepatitis B. Haemophilus influenzae type b (Hib) vaccine  You may need this if you have certain risk factors. Human papillomavirus (HPV) vaccine  If recommended by your health care provider, you may need three doses over 6 months. You may receive vaccines as individual doses or as more than one vaccine together in one shot (combination vaccines). Talk with your health care provider about the risks and benefits of combination vaccines. What tests do I need? Blood tests  Lipid and cholesterol levels. These may be checked every 5 years, or more frequently if you are over 40 years old.  Hepatitis C  test.  Hepatitis B test. Screening  Lung cancer screening. You may have this screening every year starting at age 69 if you have a 30-pack-year history of smoking and currently smoke or have quit within the past 15 years.  Prostate cancer screening. Recommendations will vary depending on your family history and other risks.  Colorectal cancer screening. All adults should have this screening  starting at age 14 and continuing until age 51. Your health care provider may recommend screening at age 53 if you are at increased risk. You will have tests every 1-10 years, depending on your results and the type of screening test.  Diabetes screening. This is done by checking your blood sugar (glucose) after you have not eaten for a while (fasting). You may have this done every 1-3 years.  Sexually transmitted disease (STD) testing. Follow these instructions at home: Eating and drinking  Eat a diet that includes fresh fruits and vegetables, whole grains, lean protein, and low-fat dairy products.  Take vitamin and mineral supplements as recommended by your health care provider.  Do not drink alcohol if your health care provider tells you not to drink.  If you drink alcohol: ? Limit how much you have to 0-2 drinks a day. ? Be aware of how much alcohol is in your drink. In the [place] one drink equals one 12 oz bottle of beer (355 mL), one 5 oz glass of wine (148 mL), or one 1 oz glass of hard liquor (44 mL). Lifestyle  Take daily care of your teeth and gums.  Stay active. Exercise for at least 30 minutes on 5 or more days each week.  Do not use any products that contain nicotine or tobacco, such as cigarettes, e-cigarettes, and chewing tobacco. If you need help quitting, ask your health care provider.  If you are sexually active, practice safe sex. Use a condom or other form of protection to prevent STIs (sexually transmitted infections).  Talk with your health care provider about taking a low-dose aspirin every day starting at age 46. What's next?  Go to your health care provider once a year for a well check visit.  Ask your health care provider how often you should have your eyes and teeth checked.  Stay up to date on all vaccines. This information is not intended to replace advice given to you by your health care provider. Make sure you discuss any questions you have with your  health care provider. Document Released: 10/26/2015 Document Revised: 09/23/2018 Document Reviewed: 09/23/2018 Elsevier Patient Education  2020 [employer]. .

## 2019-08-31 NOTE — Telephone Encounter (Signed)
Noted. Will reach out to patient with results.

## 2019-09-01 ENCOUNTER — Other Ambulatory Visit: Payer: Self-pay

## 2019-09-01 DIAGNOSIS — E782 Mixed hyperlipidemia: Secondary | ICD-10-CM

## 2019-09-01 DIAGNOSIS — Z79899 Other long term (current) drug therapy: Secondary | ICD-10-CM

## 2019-09-01 DIAGNOSIS — D582 Other hemoglobinopathies: Secondary | ICD-10-CM

## 2019-09-01 MED ORDER — ATORVASTATIN CALCIUM 10 MG PO TABS: 10.0000 mg | ORAL_TABLET | Freq: Every day | ORAL | 1 refills | Status: DC

## 2019-09-02 ENCOUNTER — Telehealth: Payer: Self-pay
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
## 2019-09-04 ENCOUNTER — Encounter: Payer: Self-pay | Admitting: Physician Assistant

## 2019-09-05 NOTE — Progress Notes (Signed)
LPN MWV note reviewed.  Wayne Rhodes Wayne Zymiere Trostle, PA-C  

## 2019-09-06 ENCOUNTER — Telehealth: Payer: Self-pay | Admitting: Hematology and Oncology

## 2019-09-06 NOTE — Telephone Encounter (Signed)
Received a new patient referral from Raiford Noble for elevated hgb. Mr. Snuffer returned my call and has been scheduled to see Dr. Lorenso Courier on 12/4 at 10am. Pt has been made aware to arrive 15 minutes early.

## 2019-09-16 ENCOUNTER — Inpatient Hospital Stay: Payer: Medicare Other

## 2019-09-16 ENCOUNTER — Other Ambulatory Visit: Payer: Self-pay

## 2019-09-16 ENCOUNTER — Inpatient Hospital Stay: Payer: Medicare Other | Attending: Hematology and Oncology | Admitting: Hematology and Oncology

## 2019-09-16 VITALS — BP 130/78 | HR 83 | Temp 97.8°F | Resp 18 | Ht 75.0 in | Wt 267.8 lb

## 2019-09-16 DIAGNOSIS — D751 Secondary polycythemia: Secondary | ICD-10-CM | POA: Insufficient documentation

## 2019-09-16 DIAGNOSIS — K219 Gastro-esophageal reflux disease without esophagitis: Secondary | ICD-10-CM | POA: Insufficient documentation

## 2019-09-16 DIAGNOSIS — F419 Anxiety disorder, unspecified: Secondary | ICD-10-CM | POA: Diagnosis not present

## 2019-09-16 DIAGNOSIS — Z79899 Other long term (current) drug therapy: Secondary | ICD-10-CM | POA: Insufficient documentation

## 2019-09-16 DIAGNOSIS — E785 Hyperlipidemia, unspecified: Secondary | ICD-10-CM | POA: Diagnosis not present

## 2019-09-16 DIAGNOSIS — M1990 Unspecified osteoarthritis, unspecified site: Secondary | ICD-10-CM | POA: Diagnosis not present

## 2019-09-16 DIAGNOSIS — I10 Essential (primary) hypertension: Secondary | ICD-10-CM | POA: Insufficient documentation

## 2019-09-16 DIAGNOSIS — Z85820 Personal history of malignant melanoma of skin: Secondary | ICD-10-CM | POA: Insufficient documentation

## 2019-09-16 LAB — CBC WITH DIFFERENTIAL (CANCER CENTER ONLY)
Abs Immature Granulocytes: 0.03 10*3/uL (ref 0.00–0.07)
Basophils Absolute: 0 10*3/uL (ref 0.0–0.1)
Basophils Relative: 1 %
Eosinophils Absolute: 0.2 10*3/uL (ref 0.0–0.5)
Eosinophils Relative: 4 %
HCT: 51.7 % (ref 39.0–52.0)
Hemoglobin: 18.1 g/dL — ABNORMAL HIGH (ref 13.0–17.0)
Immature Granulocytes: 1 %
Lymphocytes Relative: 22 %
Lymphs Abs: 1.3 10*3/uL (ref 0.7–4.0)
MCH: 31.6 pg (ref 26.0–34.0)
MCHC: 35 g/dL (ref 30.0–36.0)
MCV: 90.4 fL (ref 80.0–100.0)
Monocytes Absolute: 0.7 10*3/uL (ref 0.1–1.0)
Monocytes Relative: 11 %
Neutro Abs: 3.6 10*3/uL (ref 1.7–7.7)
Neutrophils Relative %: 61 %
Platelet Count: 229 10*3/uL (ref 150–400)
RBC: 5.72 MIL/uL (ref 4.22–5.81)
RDW: 12.7 % (ref 11.5–15.5)
WBC Count: 5.8 10*3/uL (ref 4.0–10.5)
nRBC: 0 % (ref 0.0–0.2)

## 2019-09-16 LAB — CMP (CANCER CENTER ONLY)
ALT: 23 U/L (ref 0–44)
AST: 21 U/L (ref 15–41)
Albumin: 4 g/dL (ref 3.5–5.0)
Alkaline Phosphatase: 45 U/L (ref 38–126)
Anion gap: 12 (ref 5–15)
BUN: 10 mg/dL (ref 8–23)
CO2: 26 mmol/L (ref 22–32)
Calcium: 9.2 mg/dL (ref 8.9–10.3)
Chloride: 104 mmol/L (ref 98–111)
Creatinine: 1.13 mg/dL (ref 0.61–1.24)
GFR, Est AFR Am: >60 mL/min (ref >60)
GFR, Estimated: >60 mL/min (ref >60)
Glucose, Bld: 111 mg/dL — ABNORMAL HIGH (ref 70–99)
Potassium: 3.2 mmol/L — ABNORMAL LOW (ref 3.5–5.1)
Sodium: 142 mmol/L (ref 135–145)
Total Bilirubin: 1 mg/dL (ref 0.3–1.2)
Total Protein: 6.7 g/dL (ref 6.5–8.1)

## 2019-09-16 LAB — C-REACTIVE PROTEIN: CRP: 0.6 mg/dL (ref <1.0)

## 2019-09-16 LAB — SEDIMENTATION RATE: Sed Rate: 4 mm/hr (ref 0–16)

## 2019-09-16 NOTE — Progress Notes (Signed)
[place] [phone]   Fax:([phone]) [place] NOTE  Patient Care Team: Delorse Limber as PCP - General (Family Medicine) Suella Broad, MD as Consulting Physician (Physical Medicine and Rehabilitation)  Hematological/Oncological History #Erythrocytosis 1) 06/16/2009: Hgb 17.3. First elevated Hgb on record 2) 09/16/2013: WBC 7.5, Hgb 17.6, Plt 296 3) 08/31/2019: WBC 7.6, Hgb 18.3, Plt 242.  4) 09/16/2019: Establish care with Dr. Lorenso Courier   CHIEF COMPLAINTS/PURPOSE OF CONSULTATION:  Elevated Hgb  HISTORY OF PRESENTING ILLNESS:  Wayne Rhodes 63 y.o. male with medical history significant for HTN, HLD, and melanoma of the nose presents for evaluation of longstanding elevated Hgb.  On review of prior records Mr. Wiberg has had an elevation in Hgb dating back to at least Sept 2010. His Hgb was noted to be 17.7 in Dec 2012 and 18.3 in Nov 2020. During this time he has had no other abnormalities in his counts. He was referred to hematology for further evaluation and management.  On exam today Mr. Buena Irish is withdrawn.  His answers are brief however a complete history was obtained.  He reports that he has had no recent fevers, chills, sweats, nausea, vomiting, diarrhea.  He notes that his weight has been stable and he has had no recent changes in appetite.  He denies any pruritus as well as no new skin rashes.  He reports he is otherwise been at his baseline level of health.  On further questioning he does note that he snores at night and that this has been reported to him, however he denies waking up at night gasping for air.  He denies taking any exogenous testosterone.  He denies any smoking history at this time.  A full 10 point ROS is otherwise negative and listed below.  MEDICAL HISTORY:  Past Medical History:  Diagnosis Date  . Anxiety    takes xanax for anxiety  . Arthritis   . Cancer ([place])    melanoma on nose   . GERD  (gastroesophageal reflux disease)   . Hyperlipidemia   . Hypertension   . Pneumonia    had pneumonia 4-5 months ago  . Recurrent upper respiratory infection (URI)    recent chest cold - treated with mucinex and cough medicine - now improved    SURGICAL HISTORY: Past Surgical History:  Procedure Laterality Date  . ANTERIOR CERVICAL DECOMP/DISCECTOMY FUSION  09/25/2011   Procedure: ANTERIOR CERVICAL DECOMPRESSION/DISCECTOMY FUSION 1 LEVEL;  Surgeon: Dahlia Bailiff;  Location: [facility];  Service: Orthopedics;  Laterality: N/A;  ACDF Cervical 5-6  . COLONOSCOPY    . ELBOW SURGERY     both elbow surgery  . KNEE ARTHROSCOPY     left    SOCIAL HISTORY: Social History   Socioeconomic History  . Marital status: Single    Spouse name: Not on file  . Number of children: 2  . Years of education: Not on file  . Highest education level: Not on file  Occupational History  . Occupation: Retired    Comment: Corporate treasurer  Social Needs  . Financial resource strain: Not on file  . Food insecurity    Worry: Not on file    Inability: Not on file  . Transportation needs    Medical: Not on file    Non-medical: Not on file  Tobacco Use  . Smoking status: Never Smoker  . Smokeless tobacco: Never Used  Substance and Sexual Activity  . Alcohol use: Yes  Alcohol/week: 1.0 standard drinks    Types: 1 Cans of beer per week    Comment: occasional beer  . Drug use: No  . Sexual activity: Yes  Lifestyle  . Physical activity    Days per week: Not on file    Minutes per session: Not on file  . Stress: Not on file  Relationships  . Social Herbalist on phone: Not on file    Gets together: Not on file    Attends religious service: Not on file    Active member of club or organization: Not on file    Attends meetings of clubs or organizations: Not on file    Relationship status: Not on file  . Intimate partner violence    Fear of current or ex partner: Not on file     Emotionally abused: Not on file    Physically abused: Not on file    Forced sexual activity: Not on file  Other Topics Concern  . Not on file  Social History Narrative  . Not on file    FAMILY HISTORY: Family History  Problem Relation Age of Onset  . Cancer Father        prostate  . Colon cancer Neg Hx   . Colon polyps Neg Hx   . Esophageal cancer Neg Hx   . Rectal cancer Neg Hx   . Stomach cancer Neg Hx     ALLERGIES:  is allergic to clonazepam; quinapril; and sertraline hcl.  MEDICATIONS:  Current Outpatient Medications  Medication Sig Dispense Refill  . ALPRAZolam (XANAX) 1 MG tablet TAKE 1/2 TABLET BY MOUTH 3 TIMES A DAY AS NEEDED FOR ANXIETY 45 tablet 0  . atorvastatin (LIPITOR) 10 MG tablet Take 1 tablet (10 mg total) by mouth daily. 30 tablet 1  . chlorthalidone (HYGROTON) 25 MG tablet TAKE 1 TABLET BY MOUTH EVERY DAY 90 tablet 1  . EPINEPHrine (EPI-PEN) 0.3 mg/0.3 mL DEVI Inject 0.3 mg into the muscle as needed. For anaphylaxis    . L-Methylfolate 15 MG TABS Take 1 tablet by mouth daily.    . metoprolol succinate (TOPROL-XL) 25 MG 24 hr tablet TAKE 1 TABLET BY MOUTH EVERY DAY 90 tablet 0  . oxyCODONE (ROXICODONE) 15 MG immediate release tablet Take 15 mg by mouth every 6 (six) hours as needed (Dr. Nelva Bush).     . potassium chloride SA (KLOR-CON M20) 20 MEQ tablet TAKE 1 TABLET BY MOUTH EVERY DAY 90 tablet 1  . sildenafil (REVATIO) 20 MG tablet Take 1-2 tablets as needed 30 minutes prior to sexual activity.    Marland Kitchen triamcinolone ointment (KENALOG) 0.5 % APPLY TO AFFECTED AREA TWICE A DAY 30 g 0   No current facility-administered medications for this visit.     REVIEW OF SYSTEMS:   Constitutional: ( - ) fevers, ( - )  chills , ( - ) night sweats Eyes: ( - ) blurriness of vision, ( - ) double vision, ( - ) watery eyes Ears, nose, mouth, throat, and face: ( - ) mucositis, ( - ) sore throat Respiratory: ( - ) cough, ( - ) dyspnea, ( - ) wheezes Cardiovascular: ( - )  palpitation, ( - ) chest discomfort, ( - ) lower extremity swelling Gastrointestinal:  ( - ) nausea, ( - ) heartburn, ( - ) change in bowel habits Skin: ( - ) abnormal skin rashes Lymphatics: ( - ) new lymphadenopathy, ( - ) easy bruising Neurological: ( - ) numbness, ( - )  tingling, ( - ) new weaknesses Behavioral/Psych: ( - ) mood change, ( - ) new changes  All other systems were reviewed with the patient and are negative.  PHYSICAL EXAMINATION: ECOG PERFORMANCE STATUS: 0 - Asymptomatic  Vitals:   09/16/19 1001  BP: 130/78  Pulse: 83  Resp: 18  Temp: 97.8 F (36.6 C)  SpO2: 96%   Filed Weights   09/16/19 1001  Weight: 267 lb 12.8 oz (121.5 kg)    GENERAL: well appearing elderly Caucasian male in NAD  SKIN: skin color, texture, turgor are normal, no rashes or significant lesions EYES: conjunctiva are pink and non-injected, sclera clear LUNGS: clear to auscultation and percussion with normal breathing effort HEART: regular rate & rhythm and no murmurs and no lower extremity edema ABDOMEN: soft, non-tender, non-distended, normal bowel sounds Musculoskeletal: no cyanosis of digits and no clubbing  PSYCH: alert & oriented x 3, fluent speech NEURO: no focal motor/sensory deficits  LABORATORY DATA:  I have reviewed the data as listed Recent Results (from the past 2160 hour(s))  Comp Met (CMET)     Status: Abnormal   Collection Time: 08/31/19 11:16 AM  Result Value Ref Range   Sodium 138 135 - 145 mEq/L   Potassium 3.6 3.5 - 5.1 mEq/L   Chloride 99 96 - 112 mEq/L   CO2 26 19 - 32 mEq/L   Glucose, Bld 124 (H) 70 - 99 mg/dL   BUN 15 6 - 23 mg/dL   Creatinine, Ser 1.05 0.40 - 1.50 mg/dL   Total Bilirubin 1.2 0.2 - 1.2 mg/dL   Alkaline Phosphatase 46 39 - 117 U/L   AST 17 0 - 37 U/L   ALT 18 0 - 53 U/L   Total Protein 6.7 6.0 - 8.3 g/dL   Albumin 4.3 3.5 - 5.2 g/dL   GFR 70.77 >60.00 mL/min   Calcium 9.2 8.4 - 10.5 mg/dL  CBC w/Diff     Status: Abnormal   Collection  Time: 08/31/19 11:16 AM  Result Value Ref Range   WBC 7.6 4.0 - 10.5 K/uL   RBC 5.72 4.22 - 5.81 Mil/uL   Hemoglobin 18.3 Repeated and verified X2. (HH) 13.0 - 17.0 g/dL   HCT 52.9 Repeated and verified X2. (H) 39.0 - 52.0 %   MCV 94.3 78.0 - 100.0 fl   MCHC 33.7 30.0 - 36.0 g/dL   RDW 13.6 11.5 - 15.5 %   Platelets 242.0 150.0 - 400.0 K/uL   Neutrophils Relative % 69.1 43.0 - 77.0 %   Lymphocytes Relative 17.7 12.0 - 46.0 %   Monocytes Relative 10.6 3.0 - 12.0 %   Eosinophils Relative 2.0 0.0 - 5.0 %   Basophils Relative 0.6 0.0 - 3.0 %   Neutro Abs 5.2 1.4 - 7.7 K/uL   Lymphs Abs 1.3 0.7 - 4.0 K/uL   Monocytes Absolute 0.8 0.1 - 1.0 K/uL   Eosinophils Absolute 0.2 0.0 - 0.7 K/uL   Basophils Absolute 0.0 0.0 - 0.1 K/uL  Hemoglobin A1c     Status: None   Collection Time: 08/31/19 11:16 AM  Result Value Ref Range   Hgb A1c MFr Bld 6.2 4.6 - 6.5 %    Comment: Glycemic Control Guidelines for People with Diabetes:Non Diabetic:  <6%Goal of Therapy: <7%Additional Action Suggested:  >8%   Lipid Profile     Status: Abnormal   Collection Time: 08/31/19 11:16 AM  Result Value Ref Range   Cholesterol 181 0 - 200 mg/dL    Comment:  ATP III Classification       Desirable:  < 200 mg/dL               Borderline High:  200 - 239 mg/dL          High:  > = 240 mg/dL   Triglycerides 258.0 (H) 0.0 - 149.0 mg/dL    Comment: Normal:  <150 mg/dLBorderline High:  150 - 199 mg/dL   HDL 27.20 (L) >39.00 mg/dL   VLDL 51.6 (H) 0.0 - 40.0 mg/dL   Total CHOL/HDL Ratio 7     Comment:                Men          Women1/2 Average Risk     3.4          3.3Average Risk          5.0          4.42X Average Risk          9.6          7.13X Average Risk          15.0          11.0                       NonHDL 153.83     Comment: NOTE:  Non-HDL goal should be 30 mg/dL higher than patient's LDL goal (i.e. LDL goal of < 70 mg/dL, would have non-HDL goal of < 100 mg/dL)  PSA     Status: None   Collection Time: 08/31/19  11:16 AM  Result Value Ref Range   PSA 0.79 0.10 - 4.00 ng/mL    Comment: Test performed using Access Hybritech PSA Assay, a parmagnetic partical, chemiluminecent immunoassay.  TSH     Status: None   Collection Time: 08/31/19 11:16 AM  Result Value Ref Range   TSH 3.30 0.35 - 4.50 uIU/mL  LDL cholesterol, direct     Status: None   Collection Time: 08/31/19 11:16 AM  Result Value Ref Range   Direct LDL 103.0 mg/dL    Comment: Optimal:  <100 mg/dLNear or Above Optimal:  100-129 mg/dLBorderline High:  130-159 mg/dLHigh:  160-189 mg/dLVery High:  >190 mg/dL    PATHOLOGY: None relevant to review.   RADIOGRAPHIC STUDIES: None relevant to review.   ASSESSMENT & PLAN Wayne Rhodes 63 y.o. male with medical history significant for HTN, HLD, and melanoma of the nose presents for evaluation of longstanding elevated Hgb.  The differential for erythrocytosis is quite small.  Chronic hypoxic states as well as myeloproliferative neoplasms are the 2 most common etiologies.  The chronic hypoxic states tend to be either smoking versus obstructive sleep apnea.  This patient reports no history of smoking, however he does endorse snoring at night as well as daytime somnolence which can be associated with obstructive sleep apnea.  He reports he has never had sleep studies performed and therefore I think this would be a good next step in the event that his EPO levels were normal or elevated.  This is most likely given the chronicity of his elevated hemoglobin, which clearly dates back to at least 2010. For markedly high EPO, it is important to consider [place] or RCC as a possible etiology.   In the event EPO levels were low we would consider a MPN work-up to include JAK2, BCR/abl, as well as bone marrow biopsy.  In order to assure  that his hemoglobin is stable and that he is on the right diagnostic track we will have him return in 3 months.  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  --today will recheck CBC,  CMP, and erythropoetin levels. Inflammatory markers to be checked with CRP and ESR. Metabolic assessment with TSH --recommend sleep study if the EPO returns as normal/elevated as the patient notes he snores and this would be a common secondary cause of polycythemia. --if EPO is low, consider MPN workup and testing.  --RTC in 3 months to assure Hgb is stable.   Orders Placed This Encounter  Procedures  . CBC with Differential ([facility] Only)    Standing Status:   Future    Number of Occurrences:   1    Standing Expiration Date:   09/15/2020  . CMP ([facility] only)    Standing Status:   Future    Number of Occurrences:   1    Standing Expiration Date:   09/15/2020  . Sedimentation rate    Standing Status:   Future    Number of Occurrences:   1    Standing Expiration Date:   09/15/2020  . TSH    Standing Status:   Future    Number of Occurrences:   1    Standing Expiration Date:   09/15/2020  . Erythropoietin    Standing Status:   Future    Number of Occurrences:   1    Standing Expiration Date:   09/15/2020  . C-reactive protein    Standing Status:   Future    Number of Occurrences:   1    Standing Expiration Date:   09/15/2020    All questions were answered. The patient knows to call the clinic with any problems, questions or concerns.  A total of more than 45 minutes were spent on this encounter and over half of that time was spent on counseling and coordination of care as outlined above.   Ledell Peoples, MD Department of Hematology/Oncology [place] [facility] Phone: [phone] Pager: [phone] Email: [email or web address]  09/16/2019 11:01 AM

## 2019-09-17 LAB — ERYTHROPOIETIN: Erythropoietin: 6.9 m[IU]/mL (ref 2.6–18.5)

## 2019-09-19 LAB — TSH: TSH: 2.376 u[IU]/mL (ref 0.320–4.118)

## 2019-09-21 ENCOUNTER — Other Ambulatory Visit: Payer: Self-pay | Admitting: Hematology and Oncology

## 2019-09-21 DIAGNOSIS — D751 Secondary polycythemia: Secondary | ICD-10-CM

## 2019-09-24 ENCOUNTER — Other Ambulatory Visit: Payer: Self-pay | Admitting: Physician Assistant

## 2019-09-26 ENCOUNTER — Other Ambulatory Visit: Payer: Self-pay | Admitting: Physician Assistant

## 2019-09-27 NOTE — Telephone Encounter (Signed)
Xanax last rx 09/01/19 #45 LOV: 08/31/19 CPE CSC: 05/25/18

## 2019-09-29 ENCOUNTER — Other Ambulatory Visit: Payer: Self-pay | Admitting: Physician Assistant

## 2019-10-04 ENCOUNTER — Telehealth: Payer: Self-pay | Admitting: *Deleted

## 2019-10-04 NOTE — Telephone Encounter (Signed)
Received vm message from patient regarding lab results from 2 weeks ago.  Requesting call back.  Please advise.

## 2019-10-11 ENCOUNTER — Telehealth: Payer: Self-pay | Admitting: Hematology and Oncology

## 2019-10-11 NOTE — Telephone Encounter (Signed)
Scheduled per 12/4 los. Called and spoke with pt, confirmed 3/4 appt. Mailing printout

## 2019-10-20 ENCOUNTER — Other Ambulatory Visit: Payer: Self-pay | Admitting: Physician Assistant

## 2019-10-20 ENCOUNTER — Ambulatory Visit: Payer: Medicare Other | Attending: Internal Medicine

## 2019-10-20 ENCOUNTER — Ambulatory Visit (INDEPENDENT_AMBULATORY_CARE_PROVIDER_SITE_OTHER): Payer: Medicare Other | Admitting: Physician Assistant

## 2019-10-20 ENCOUNTER — Encounter: Payer: Self-pay | Admitting: Physician Assistant

## 2019-10-20 ENCOUNTER — Other Ambulatory Visit: Payer: Self-pay

## 2019-10-20 DIAGNOSIS — Z20822 Contact with and (suspected) exposure to covid-19: Secondary | ICD-10-CM

## 2019-10-20 NOTE — Progress Notes (Signed)
I have discussed the procedure for the virtual visit with the patient who has given consent to proceed with assessment and treatment.   Piotr Christopher S Lamount Bankson, CMA     

## 2019-10-20 NOTE — Progress Notes (Signed)
Virtual Visit via Telephone Note  I connected with Ina Homes on 10/20/19 at  9:30 AM EST by telephone and verified that I am speaking with the correct person using two identifiers.  Location: Patient: Home Provider: [facility]   I discussed the limitations, risks, security and privacy concerns of performing an evaluation and management service by telephone and the availability of in person appointments. I also discussed with the patient that there may be a patient responsible charge related to this service. The patient expressed understanding and agreed to proceed.  History of Present Illness: Patient presents via phone today to discuss COVID exposure and testing. Patient endorses spending New Years Eve with his GF who works at the hospital. The next day she began not feeling well. Was tested for COVID and found out yesterday she was +. Patient is not having any symptoms at present but wants to know next steps.    Observations/Objective: No labored breathing.  Speech is clear and coherent with logical content.  Patient is alert and oriented at baseline.    Assessment and Plan: 1. Exposure to COVID-19 virus Asymptomatic. > 4 days since exposure. He is being sent for testing and to quarantine until results are in. Supportive measures discussed. Reviewed symptoms to watch out for. Unable to enroll in symptom monitoring program as he does not have a computer or MyChart.    Follow Up Instructions: I discussed the assessment and treatment plan with the patient. The patient was provided an opportunity to ask questions and all were answered. The patient agreed with the plan and demonstrated an understanding of the instructions.   The patient was advised to call back or seek an in-person evaluation if the symptoms worsen or if the condition fails to improve as anticipated.  I provided 10 minutes of non-face-to-face time during this encounter.   Leeanne Rio, PA-C

## 2019-10-22 LAB — NOVEL CORONAVIRUS, NAA: SARS-CoV-2, NAA: NOT DETECTED

## 2019-10-24 ENCOUNTER — Ambulatory Visit: Payer: Medicare Other

## 2019-10-24 ENCOUNTER — Telehealth: Payer: Self-pay | Admitting: Physician Assistant

## 2019-10-24 NOTE — Telephone Encounter (Signed)
Pt calling for COVID results, I told him negative but wanted to be sure that was correct and was unsure if you had any follow up questions or advice for him.

## 2019-10-25 NOTE — Telephone Encounter (Signed)
Pt notified Negative results Covid test  No Sx at this time  Advised to hand wash and use precaution

## 2019-10-26 ENCOUNTER — Other Ambulatory Visit: Payer: Self-pay | Admitting: Physician Assistant

## 2019-10-28 ENCOUNTER — Other Ambulatory Visit: Payer: Self-pay | Admitting: Physician Assistant

## 2019-11-07 ENCOUNTER — Telehealth: Payer: Self-pay

## 2019-11-07 NOTE — Telephone Encounter (Signed)
Have not received anything regarding this. Reviewed EMR and did not see where I had evaluated him for this so not sure I will be able to fill out anything.
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
## 2019-11-27 ENCOUNTER — Other Ambulatory Visit: Payer: Self-pay | Admitting: Physician Assistant

## 2019-11-28 NOTE — Telephone Encounter (Signed)
Xanax last rx 10/26/19 #45 LOV: 10/20/19 Covid exposure CSC: 05/25/18

## 2019-12-13 ENCOUNTER — Telehealth: Payer: Self-pay | Admitting: Hematology and Oncology

## 2019-12-13 NOTE — Telephone Encounter (Signed)
Called pt per 3/2 sch message - unable to reach pt . Left message for pt to call back to reschedule appt

## 2019-12-15 ENCOUNTER — Ambulatory Visit: Payer: Medicare Other | Admitting: Hematology and Oncology

## 2019-12-26 ENCOUNTER — Other Ambulatory Visit: Payer: Self-pay | Admitting: Physician Assistant

## 2019-12-26 NOTE — Telephone Encounter (Signed)
Xanax last rx 11/28/19 #45 LOV: 10/20/19 Covid exposure CSC: 05/25/18

## 2020-01-06 ENCOUNTER — Telehealth: Payer: Self-pay | Admitting: Hematology and Oncology

## 2020-01-06 ENCOUNTER — Inpatient Hospital Stay: Payer: Medicare Other | Admitting: Hematology and Oncology

## 2020-01-06 NOTE — Telephone Encounter (Signed)
Rescheduled appt per 3/25 sch msg. Pt confirmed new appt date and time.

## 2020-01-24 ENCOUNTER — Telehealth: Payer: Self-pay | Admitting: *Deleted

## 2020-01-24 NOTE — Telephone Encounter (Signed)
Patient called to reschedule his appt with Dr. Lorenso Courier this week. States he double booked himself.  Sent scheduling message to reschedule.

## 2020-01-25 ENCOUNTER — Other Ambulatory Visit: Payer: Self-pay | Admitting: Physician Assistant

## 2020-01-25 DIAGNOSIS — I10 Essential (primary) hypertension: Secondary | ICD-10-CM

## 2020-01-26 ENCOUNTER — Inpatient Hospital Stay: Payer: Medicare Other | Admitting: Hematology and Oncology

## 2020-01-26 ENCOUNTER — Other Ambulatory Visit: Payer: Self-pay | Admitting: Physician Assistant

## 2020-01-26 NOTE — Telephone Encounter (Signed)
Xanax last rx 12/26/19 # 45 LOV: 10/20/19 exposure to Covid CSC: 05/25/18 Pending appt: 01/30/20

## 2020-01-30 ENCOUNTER — Ambulatory Visit (INDEPENDENT_AMBULATORY_CARE_PROVIDER_SITE_OTHER): Payer: Medicare Other | Admitting: Physician Assistant

## 2020-01-30 ENCOUNTER — Other Ambulatory Visit: Payer: Self-pay

## 2020-01-30 ENCOUNTER — Encounter: Payer: Self-pay | Admitting: Physician Assistant

## 2020-01-30 ENCOUNTER — Telehealth: Payer: Self-pay | Admitting: Physician Assistant

## 2020-01-30 VITALS — BP 130/84 | HR 84 | Temp 98.3°F | Resp 16 | Ht 75.0 in | Wt 263.0 lb

## 2020-01-30 DIAGNOSIS — N529 Male erectile dysfunction, unspecified: Secondary | ICD-10-CM | POA: Diagnosis not present

## 2020-01-30 DIAGNOSIS — E785 Hyperlipidemia, unspecified: Secondary | ICD-10-CM | POA: Diagnosis not present

## 2020-01-30 DIAGNOSIS — M2560 Stiffness of unspecified joint, not elsewhere classified: Secondary | ICD-10-CM

## 2020-01-30 DIAGNOSIS — I10 Essential (primary) hypertension: Secondary | ICD-10-CM

## 2020-01-30 MED ORDER — SILDENAFIL CITRATE 20 MG PO TABS: ORAL_TABLET | ORAL | 0 refills | Status: DC

## 2020-01-30 NOTE — Patient Instructions (Signed)
Please go to the lab today for blood work.  I will call you with your results. We will alter treatment regimen(s) if indicated by your results.   Please keep up with your medication regimen. You can take the Sildenafil (generic Viagra) as directed for ED. Stay well-hydrated prior to use.    DASH Eating Plan DASH stands for "Dietary Approaches to Stop Hypertension." The DASH eating plan is a healthy eating plan that has been shown to reduce high blood pressure (hypertension). It may also reduce your risk for type 2 diabetes, heart disease, and stroke. The DASH eating plan may also help with weight loss. What are tips for following this plan?  General guidelines  Avoid eating more than 2,300 mg (milligrams) of salt (sodium) a day. If you have hypertension, you may need to reduce your sodium intake to 1,500 mg a day.  Limit alcohol intake to no more than 1 drink a day for nonpregnant women and 2 drinks a day for men. One drink equals 12 oz of beer, 5 oz of wine, or 1 oz of hard liquor.  Work with your health care provider to maintain a healthy body weight or to lose weight. Ask what an ideal weight is for you.  Get at least 30 minutes of exercise that causes your heart to beat faster (aerobic exercise) most days of the week. Activities may include walking, swimming, or biking.  Work with your health care provider or diet and nutrition specialist (dietitian) to adjust your eating plan to your individual calorie needs. Reading food labels   Check food labels for the amount of sodium per serving. Choose foods with less than 5 percent of the Daily Value of sodium. Generally, foods with less than 300 mg of sodium per serving fit into this eating plan.  To find whole grains, look for the word "whole" as the first word in the ingredient list. Shopping  Buy products labeled as "low-sodium" or "no salt added."  Buy fresh foods. Avoid canned foods and premade or frozen meals. Cooking  Avoid  adding salt when cooking. Use salt-free seasonings or herbs instead of table salt or sea salt. Check with your health care provider or pharmacist before using salt substitutes.  Do not fry foods. Cook foods using healthy methods such as baking, boiling, grilling, and broiling instead.  Cook with heart-healthy oils, such as olive, canola, soybean, or sunflower oil. Meal planning  Eat a balanced diet that includes: ? 5 or more servings of fruits and vegetables each day. At each meal, try to fill half of your plate with fruits and vegetables. ? Up to 6-8 servings of whole grains each day. ? Less than 6 oz of lean meat, poultry, or fish each day. A 3-oz serving of meat is about the same size as a deck of cards. One egg equals 1 oz. ? 2 servings of low-fat dairy each day. ? A serving of nuts, seeds, or beans 5 times each week. ? Heart-healthy fats. Healthy fats called Omega-3 fatty acids are found in foods such as flaxseeds and coldwater fish, like sardines, salmon, and mackerel.  Limit how much you eat of the following: ? Canned or prepackaged foods. ? Food that is high in trans fat, such as fried foods. ? Food that is high in saturated fat, such as fatty meat. ? Sweets, desserts, sugary drinks, and other foods with added sugar. ? Full-fat dairy products.  Do not salt foods before eating.  Try to eat at least  2 vegetarian meals each week.  Eat more home-cooked food and less restaurant, buffet, and fast food.  When eating at a restaurant, ask that your food be prepared with less salt or no salt, if possible. What foods are recommended? The items listed may not be a complete list. Talk with your dietitian about what dietary choices are best for you. Grains Whole-grain or whole-wheat bread. Whole-grain or whole-wheat pasta. Brown rice. [place]row. Bulgur. Whole-grain and low-sodium cereals. Pita bread. Low-fat, low-sodium crackers. Whole-wheat flour tortillas. Vegetables Fresh or  frozen vegetables (raw, steamed, roasted, or grilled). Low-sodium or reduced-sodium tomato and vegetable juice. Low-sodium or reduced-sodium tomato sauce and tomato paste. Low-sodium or reduced-sodium canned vegetables. Fruits All fresh, dried, or frozen fruit. Canned fruit in natural juice (without added sugar). Meat and other protein foods Skinless chicken or [place]. Ground chicken or [place]. Pork with fat trimmed off. Fish and seafood. Egg whites. Dried beans, peas, or lentils. Unsalted nuts, nut butters, and seeds. Unsalted canned beans. Lean cuts of beef with fat trimmed off. Low-sodium, lean deli meat. Dairy Low-fat (1%) or fat-free (skim) milk. Fat-free, low-fat, or reduced-fat cheeses. Nonfat, low-sodium ricotta or cottage cheese. Low-fat or nonfat yogurt. Low-fat, low-sodium cheese. Fats and oils Soft margarine without trans fats. Vegetable oil. Low-fat, reduced-fat, or light mayonnaise and salad dressings (reduced-sodium). Canola, safflower, olive, soybean, and sunflower oils. Avocado. Seasoning and other foods Herbs. Spices. Seasoning mixes without salt. Unsalted popcorn and pretzels. Fat-free sweets. What foods are not recommended? The items listed may not be a complete list. Talk with your dietitian about what dietary choices are best for you. Grains Baked goods made with fat, such as croissants, muffins, or some breads. Dry pasta or rice meal packs. Vegetables Creamed or fried vegetables. Vegetables in a cheese sauce. Regular canned vegetables (not low-sodium or reduced-sodium). Regular canned tomato sauce and paste (not low-sodium or reduced-sodium). Regular tomato and vegetable juice (not low-sodium or reduced-sodium). Angie Fava. Olives. Fruits Canned fruit in a light or heavy syrup. Fried fruit. Fruit in cream or butter sauce. Meat and other protein foods Fatty cuts of meat. Ribs. Fried meat. Berniece Salines. Sausage. Bologna and other processed lunch meats. Salami. Fatback. Hotdogs.  Bratwurst. Salted nuts and seeds. Canned beans with added salt. Canned or smoked fish. Whole eggs or egg yolks. Chicken or [place] with skin. Dairy Whole or 2% milk, cream, and half-and-half. Whole or full-fat cream cheese. Whole-fat or sweetened yogurt. Full-fat cheese. Nondairy creamers. Whipped toppings. Processed cheese and cheese spreads. Fats and oils Butter. Stick margarine. Lard. Shortening. Ghee. Bacon fat. Tropical oils, such as coconut, palm kernel, or palm oil. Seasoning and other foods Salted popcorn and pretzels. Onion salt, garlic salt, seasoned salt, table salt, and sea salt. Worcestershire sauce. Tartar sauce. Barbecue sauce. Teriyaki sauce. Soy sauce, including reduced-sodium. Steak sauce. Canned and packaged gravies. Fish sauce. Oyster sauce. Cocktail sauce. Horseradish that you find on the shelf. Ketchup. Mustard. Meat flavorings and tenderizers. Bouillon cubes. Hot sauce and Tabasco sauce. Premade or packaged marinades. Premade or packaged taco seasonings. Relishes. Regular salad dressings. Where to find more information:  National Heart, Lung, and [place]: https://wilson-eaton.com/  American Heart Association: www.heart.org Summary  The DASH eating plan is a healthy eating plan that has been shown to reduce high blood pressure (hypertension). It may also reduce your risk for type 2 diabetes, heart disease, and stroke.  With the DASH eating plan, you should limit salt (sodium) intake to 2,300 mg a day. If you have hypertension, you may  need to reduce your sodium intake to 1,500 mg a day.  When on the DASH eating plan, aim to eat more fresh fruits and vegetables, whole grains, lean proteins, low-fat dairy, and heart-healthy fats.  Work with your health care provider or diet and nutrition specialist (dietitian) to adjust your eating plan to your individual calorie needs. This information is not intended to replace advice given to you by your health care provider. Make sure you  discuss any questions you have with your health care provider. Document Revised: 09/11/2017 Document Reviewed: 09/22/2016 Elsevier Patient Education  2020 [employer].

## 2020-01-30 NOTE — Progress Notes (Signed)
Patient presents to clinic today to follow-up regarding hyperlipidemia.  Patient also with acute concerns today.  In regards to elevated cholesterol, patient currently on a regimen of atorvastatin 10 mg daily.  Patient endorses taking medication as directed and tolerating well.  Has been working hard to keep a well-balanced diet.  Patient endorses walking daily for exercise.  Patient also with history of arthritis and trigger fingers of the hand, followed by hand surgery.  Notes worsening of a.m. stiffness, oftentimes taking an hour or more to work out.  Notes family history of arthritis.  Denies noticed redness of joints.  Sometimes joints feel swollen.  Denies overlying skin lesion.  Patient would also like to discuss restarting medication for erectile dysfunction.  Patient with longstanding history of difficulty to achieve or maintain an erection significant enough for penetration.  Denies any trauma or injury to the area.  Denies urinary frequency, urgency or hesitancy.  Nocturia times 1 at night.  Denies any abnormal curvature in the penis.  Has been on Viagra previously with good results.  Is interested in restarting.  Past Medical History:  Diagnosis Date  . Anxiety    takes xanax for anxiety  . Arthritis   . Cancer ([place])    melanoma on nose   . GERD (gastroesophageal reflux disease)   . Hyperlipidemia   . Hypertension   . Pneumonia    had pneumonia 4-5 months ago  . Recurrent upper respiratory infection (URI)    recent chest cold - treated with mucinex and cough medicine - now improved    Current Outpatient Medications on File Prior to Visit  Medication Sig Dispense Refill  . ALPRAZolam (XANAX) 1 MG tablet TAKE 1/2 TABLET BY MOUTH 3 TIMES A DAY AS NEEDED FOR ANXIETY 45 tablet 0  . atorvastatin (LIPITOR) 10 MG tablet TAKE 1 TABLET BY MOUTH EVERY DAY 90 tablet 1  . chlorthalidone (HYGROTON) 25 MG tablet TAKE 1 TABLET BY MOUTH EVERY DAY 90 tablet 1  . EPINEPHrine (EPI-PEN) 0.3  mg/0.3 mL DEVI Inject 0.3 mg into the muscle as needed. For anaphylaxis    . L-Methylfolate 15 MG TABS Take 1 tablet by mouth daily.    . metoprolol succinate (TOPROL-XL) 25 MG 24 hr tablet TAKE 1 TABLET BY MOUTH EVERY DAY 90 tablet 1  . oxyCODONE (ROXICODONE) 15 MG immediate release tablet Take 15 mg by mouth every 6 (six) hours as needed (Dr. Nelva Bush).     . potassium chloride SA (KLOR-CON M20) 20 MEQ tablet TAKE 1 TABLET BY MOUTH EVERY DAY 90 tablet 1  . sildenafil (REVATIO) 20 MG tablet Take 1-2 tablets as needed 30 minutes prior to sexual activity.    Marland Kitchen triamcinolone ointment (KENALOG) 0.5 % APPLY TO AFFECTED AREA TWICE A DAY 30 g 0  . [DISCONTINUED] buPROPion (WELLBUTRIN XL) 150 MG 24 hr tablet Take 150 mg by mouth daily.    . [DISCONTINUED] diphenhydrAMINE (BENADRYL) 25 MG tablet Take 25 mg by mouth 2 (two) times daily as needed. For swelling     No current facility-administered medications on file prior to visit.    Allergies  Allergen Reactions  . Clonazepam     Angioedema   . Quinapril     Angioedema   . Sertraline Hcl Other (See Comments)    Suicidal thoughts    Family History  Problem Relation Age of Onset  . Cancer Father        prostate  . Colon cancer Neg Hx   .  Colon polyps Neg Hx   . Esophageal cancer Neg Hx   . Rectal cancer Neg Hx   . Stomach cancer Neg Hx     Social History   Socioeconomic History  . Marital status: Single    Spouse name: Not on file  . Number of children: 2  . Years of education: Not on file  . Highest education level: Not on file  Occupational History  . Occupation: Retired    Comment: Corporate treasurer  Tobacco Use  . Smoking status: Never Smoker  . Smokeless tobacco: Never Used  Substance and Sexual Activity  . Alcohol use: Yes    Alcohol/week: 1.0 standard drinks    Types: 1 Cans of beer per week    Comment: occasional beer  . Drug use: No  . Sexual activity: Yes  Other Topics Concern  . Not on file  Social History  Narrative  . Not on file   Social Determinants of Health   Financial Resource Strain:   . Difficulty of Paying Living Expenses:   Food Insecurity:   . Worried About Charity fundraiser in the Last Year:   . Arboriculturist in the Last Year:   Transportation Needs:   . Film/video editor (Medical):   Marland Kitchen Lack of Transportation (Non-Medical):   Physical Activity:   . Days of Exercise per Week:   . Minutes of Exercise per Session:   Stress:   . Feeling of Stress :   Social Connections:   . Frequency of Communication with Friends and Family:   . Frequency of Social Gatherings with Friends and Family:   . Attends Religious Services:   . Active Member of Clubs or Organizations:   . Attends Archivist Meetings:   Marland Kitchen Marital Status:    Review of Systems - See HPI.  All other ROS are negative.  BP 130/84   Pulse 84   Temp 98.3 F (36.8 C) (Temporal)   Resp 16   Ht 6\' 3"  (1.905 m)   Wt 263 lb (119.3 kg)   SpO2 98%   BMI 32.87 kg/m   Physical Exam Vitals reviewed.  Constitutional:      Appearance: Normal appearance.  HENT:     Head: Normocephalic and atraumatic.  Cardiovascular:     Rate and Rhythm: Normal rate and regular rhythm.     Heart sounds: Normal heart sounds.  Pulmonary:     Effort: Pulmonary effort is normal.  Musculoskeletal:     Right hand: Normal.     Left hand: Normal.     Cervical back: Neck supple.  Neurological:     Mental Status: He is alert.  Psychiatric:        Mood and Affect: Mood normal.     Assessment/Plan: 1. Joint stiffness Examination unremarkable today.  Suspect this is likely just worsening of osteoarthritis.  However given patient concerns of family history we will check rheumatoid factor and anti-CCP to assess for RA.  Will alter management accordingly.  Patient to continue follow-up with hand specialist. - Rheumatoid Factor - Cyclic citrul peptide antibody, IgG ([facility])  2. Dyslipidemia Taking statin as directed.   Dietary and exercise recommendations again reviewed.  Blood pressure stable.  Repeat fasting labs today. - Comprehensive metabolic panel - Lipid panel  3. Erectile dysfunction, unspecified erectile dysfunction type Ongoing.  No new symptoms.  Will restart generic sildenafil 20 to 40 mg p.o. for ADD.  Patient encouraged to hydrate well before taking  medication.  He is to follow-up via phone to let us know how this is doing.  This visit occurred during the SARS-CoV-2 public health emergency.  Safety protocols were in place, including screening questions prior to the visit, additional usage of staff PPE, and extensive cleaning of exam room while observing appropriate contact time as indicated for disinfecting solutions.     Leeanne Rio, PA-C

## 2020-01-30 NOTE — Telephone Encounter (Signed)
Pt called in stating that the sildenafil is not covered by his insurance. He wanted to know if something else could be sent in for him. Please advise.

## 2020-01-30 NOTE — Telephone Encounter (Signed)
As discussed at his visit, insurance does not cover as it does not deem these medicines for ED, medically appropriate. Typically the generic Viagra (sildenafil) is the cheapest. He can contact insurance to see if they will cover alternatives. Otherwise we could send Rx to [facility] in [place] which does have the generic viagra at a much cheaper cost.

## 2020-01-31 ENCOUNTER — Ambulatory Visit: Payer: Medicare Other

## 2020-01-31 DIAGNOSIS — I10 Essential (primary) hypertension: Secondary | ICD-10-CM

## 2020-01-31 LAB — COMPREHENSIVE METABOLIC PANEL
ALT: 22 U/L (ref 0–53)
AST: 32 U/L (ref 0–37)
Albumin: 4.2 g/dL (ref 3.5–5.2)
Alkaline Phosphatase: 41 U/L (ref 39–117)
BUN: 13 mg/dL (ref 6–23)
CO2: 22 mEq/L (ref 19–32)
Calcium: 9.2 mg/dL (ref 8.4–10.5)
Chloride: 102 mEq/L (ref 96–112)
Creatinine, Ser: 1.01 mg/dL (ref 0.40–1.50)
GFR: 73.92 mL/min (ref >60.00)
Glucose, Bld: 134 mg/dL — ABNORMAL HIGH (ref 70–99)
Potassium: 3.8 mEq/L (ref 3.5–5.1)
Sodium: 137 mEq/L (ref 135–145)
Total Bilirubin: 1.4 mg/dL — ABNORMAL HIGH (ref 0.2–1.2)
Total Protein: 6.5 g/dL (ref 6.0–8.3)

## 2020-01-31 LAB — LDL CHOLESTEROL, DIRECT: Direct LDL: 68 mg/dL

## 2020-01-31 LAB — CYCLIC CITRUL PEPTIDE ANTIBODY, IGG: Cyclic Citrullin Peptide Ab: <16 UNITS

## 2020-01-31 LAB — LIPID PANEL
Cholesterol: 136 mg/dL (ref 0–200)
HDL: 26.3 mg/dL — ABNORMAL LOW (ref >39.00)
NonHDL: 109.78
Total CHOL/HDL Ratio: 5
Triglycerides: 210 mg/dL — ABNORMAL HIGH (ref 0.0–149.0)
VLDL: 42 mg/dL — ABNORMAL HIGH (ref 0.0–40.0)

## 2020-01-31 LAB — RHEUMATOID FACTOR: Rheumatoid fact SerPl-aCnc: <14 IU/mL (ref <14)

## 2020-01-31 MED ORDER — SILDENAFIL CITRATE 20 MG PO TABS: ORAL_TABLET | ORAL | 0 refills | Status: DC

## 2020-01-31 NOTE — Telephone Encounter (Signed)
Advised patient of PCP recommendations. He is agreeable and wanted to have the rx sent to [facility] in [place]. Rx sent

## 2020-01-31 NOTE — Progress Notes (Signed)
Wayne Rhodes, 64 year old male here for a redraw of his lipid panel and metabolic panel.

## 2020-02-01 ENCOUNTER — Other Ambulatory Visit: Payer: Self-pay | Admitting: Chiropractic Medicine

## 2020-02-01 DIAGNOSIS — M545 Low back pain, unspecified: Secondary | ICD-10-CM

## 2020-02-06 ENCOUNTER — Inpatient Hospital Stay: Payer: Medicare Other | Admitting: Hematology and Oncology

## 2020-02-06 ENCOUNTER — Telehealth: Payer: Self-pay | Admitting: *Deleted

## 2020-02-06 ENCOUNTER — Telehealth: Payer: Self-pay | Admitting: Hematology and Oncology

## 2020-02-06 ENCOUNTER — Other Ambulatory Visit: Payer: Medicare Other

## 2020-02-06 ENCOUNTER — Other Ambulatory Visit: Payer: Self-pay | Admitting: Hematology and Oncology

## 2020-02-06 DIAGNOSIS — D751 Secondary polycythemia: Secondary | ICD-10-CM

## 2020-02-06 NOTE — Telephone Encounter (Signed)
Received vm message from patient requesting to cancel his appt for today.  Scheduling message sent.  Dr. Lorenso Courier is aware.

## 2020-02-06 NOTE — Telephone Encounter (Signed)
Called pt per 4/26 sch message - pt not feeling well and does not want to reschedule at the moment , states he will call back when he is feeling better

## 2020-02-24 ENCOUNTER — Other Ambulatory Visit: Payer: Self-pay

## 2020-02-24 ENCOUNTER — Encounter: Payer: Self-pay | Admitting: Physician Assistant

## 2020-02-24 ENCOUNTER — Ambulatory Visit (INDEPENDENT_AMBULATORY_CARE_PROVIDER_SITE_OTHER): Payer: Medicare Other | Admitting: Physician Assistant

## 2020-02-24 ENCOUNTER — Other Ambulatory Visit: Payer: Self-pay | Admitting: Physician Assistant

## 2020-02-24 DIAGNOSIS — G8929 Other chronic pain: Secondary | ICD-10-CM

## 2020-02-24 DIAGNOSIS — M5440 Lumbago with sciatica, unspecified side: Secondary | ICD-10-CM | POA: Diagnosis not present

## 2020-02-24 DIAGNOSIS — R42 Dizziness and giddiness: Secondary | ICD-10-CM

## 2020-02-24 NOTE — Progress Notes (Signed)
Virtual Visit via Telephone Note  I connected with Wayne Rhodes on 02/24/20 at  2:30 PM EDT by telephone and verified that I am speaking with the correct person using two identifiers.  Location: Patient: Home Provider: [facility]   I discussed the limitations, risks, security and privacy concerns of performing an evaluation and management service by telephone and the availability of in person appointments. I also discussed with the patient that there may be a patient responsible charge related to this service. The patient expressed understanding and agreed to proceed.  History of Present Illness: Patient presents via telephone today as he cannot utilize video platform and refused in-office evaluation when being scheduled.   Patient endorses having ongoing issues with lower back pain. Is currently followed by neurosurgery (Dr. Nelva Bush). Has MRI scheduled for 03/01/20. Notes being placed on a regimen of oxycodone 15 mg, taking 1 tablet up to every 6 hours as needed.  Patient states his most recent prescription was sent in as a generic and he does not feel it is working as well for him.  Did make the pharmacy aware but has not contacted Dr. Dossie Der.  Notes ongoing weakness in bilateral lower extremities.  Denies saddle anesthesia or change to bowel or bladder habits.  States he has noted some feeling of off-balance and lightheaded when standing or quickly or from sitting from a lying position.  Denies any true vertigo.  Denies shortness of breath, racing heart or syncope.  Denies fall.  Denies any ear pressure or popping.    Observations/Objective:.  No labored breathing.  Speech is clear and coherent with logical content.  Patient is alert and oriented at baseline.   Assessment and Plan: 1. Episodic lightheadedness In the absence of other symptoms this does sound to be most consistent with orthostatic drop in blood pressure with positional change.  Even though he denies any noted palpitations I cannot  rule out an arrhythmia or vestibular cause of his symptoms.  Patient is stable at present, eating and drinking without issue.  Asymptomatic at time of visit.  He is to have his girlfriend check his BP and record when she gets home to make sure this is staying stable.  An office evaluation scheduled first thing Monday morning for further assessment and management.  Patient states he is unable to come in today.  Strict ER precautions reviewed with patient.  2. Chronic bilateral low back pain with sciatica, sciatica laterality unspecified Managed by neurosurgery.  Has MRI next week.  Discussed the issue with generic versus branded oxycodone needs to be discussed with the prescribing provider.  No change in baseline symptoms since his assessment with neurosurgery.  Follow-up with neurosurgery as discussed.  Follow Up Instructions:  I discussed the assessment and treatment plan with the patient. The patient was provided an opportunity to ask questions and all were answered. The patient agreed with the plan and demonstrated an understanding of the instructions.   The patient was advised to call back or seek an in-person evaluation if the symptoms worsen or if the condition fails to improve as anticipated.  I provided 23 minutes of non-face-to-face time during this encounter.   Leeanne Rio, PA-C

## 2020-02-24 NOTE — Telephone Encounter (Signed)
Xanax last rx 01/26/20 #45 LOV:  Today 02/24/20 CSC: 05/25/18

## 2020-02-24 NOTE — Progress Notes (Signed)
I have discussed the procedure for the virtual visit with the patient who has given consent to proceed with assessment and treatment. Patient is unable to obtain vital signs.  Fritz Pickerel, LPN

## 2020-02-27 ENCOUNTER — Ambulatory Visit (INDEPENDENT_AMBULATORY_CARE_PROVIDER_SITE_OTHER): Payer: Medicare Other | Admitting: Physician Assistant

## 2020-02-27 ENCOUNTER — Other Ambulatory Visit: Payer: Self-pay

## 2020-02-27 ENCOUNTER — Encounter: Payer: Self-pay | Admitting: Physician Assistant

## 2020-02-27 VITALS — BP 110/80 | HR 80 | Temp 98.2°F | Resp 16 | Ht 75.0 in | Wt 251.0 lb

## 2020-02-27 DIAGNOSIS — I951 Orthostatic hypotension: Secondary | ICD-10-CM

## 2020-02-27 LAB — COMPREHENSIVE METABOLIC PANEL
ALT: 37 U/L (ref 0–53)
AST: 44 U/L — ABNORMAL HIGH (ref 0–37)
Albumin: 3.2 g/dL — ABNORMAL LOW (ref 3.5–5.2)
Alkaline Phosphatase: 62 U/L (ref 39–117)
BUN: 20 mg/dL (ref 6–23)
CO2: 29 mEq/L (ref 19–32)
Calcium: 8.4 mg/dL (ref 8.4–10.5)
Chloride: 97 mEq/L (ref 96–112)
Creatinine, Ser: 0.87 mg/dL (ref 0.40–1.50)
GFR: 87.79 mL/min (ref >60.00)
Glucose, Bld: 155 mg/dL — ABNORMAL HIGH (ref 70–99)
Potassium: 3.7 mEq/L (ref 3.5–5.1)
Sodium: 136 mEq/L (ref 135–145)
Total Bilirubin: 0.9 mg/dL (ref 0.2–1.2)
Total Protein: 6 g/dL (ref 6.0–8.3)

## 2020-02-27 MED ORDER — HYDROCHLOROTHIAZIDE 12.5 MG PO TABS
12.5000 mg | ORAL_TABLET | Freq: Every day | ORAL | 1 refills | Status: DC
Start: 2020-02-27 — End: 2020-07-10

## 2020-02-27 NOTE — Patient Instructions (Signed)
Please go to the lab today for blood work.  I will call you with your results. We will alter treatment regimen(s) if indicated by your results.   Please stop the chlorthalidone and start the hydrochlorothiazide (HCTZ) as directed instead.   Keep daily check on BP.  Also monitoring symptoms. We should see that the off-balance sensation improves with this change.  Follow-up with me in 7-10 days.  Follow-up with Dr. Nelva Bush as scheduled.

## 2020-02-27 NOTE — Progress Notes (Signed)
Patient presents to clinic today for an office evaluation of positional lightheadedness.  Patient initially assessed via video visit last Friday and based on that presentation there was some concern for orthostatic hypotension but this needed further assessment.  Patient denies any new symptoms developing over the weekend.  Still noting a feeling of being off balance when changing position.  Denies true vertigo.  Denies syncope or fall.  Still having his ongoing lower back issue, MRI scheduled with neurosurgery this Thursday.  Taking medications as directed.  Patient again denies any chest pain, racing heart or shortness of breath.  Is staying well-hydrated.  Past Medical History:  Diagnosis Date  . Anxiety    takes xanax for anxiety  . Arthritis   . Cancer ([place])    melanoma on nose   . GERD (gastroesophageal reflux disease)   . Hyperlipidemia   . Hypertension   . Pneumonia    had pneumonia 4-5 months ago  . Recurrent upper respiratory infection (URI)    recent chest cold - treated with mucinex and cough medicine - now improved    Current Outpatient Medications on File Prior to Visit  Medication Sig Dispense Refill  . ALPRAZolam (XANAX) 1 MG tablet TAKE 1/2 TABLET BY MOUTH 3 TIMES A DAY AS NEEDED FOR ANXIETY 45 tablet 0  . atorvastatin (LIPITOR) 10 MG tablet TAKE 1 TABLET BY MOUTH EVERY DAY 90 tablet 1  . chlorthalidone (HYGROTON) 25 MG tablet TAKE 1 TABLET BY MOUTH EVERY DAY 90 tablet 1  . EPINEPHrine (EPI-PEN) 0.3 mg/0.3 mL DEVI Inject 0.3 mg into the muscle as needed. For anaphylaxis    . L-Methylfolate 15 MG TABS Take 1 tablet by mouth daily.    . metoprolol succinate (TOPROL-XL) 25 MG 24 hr tablet TAKE 1 TABLET BY MOUTH EVERY DAY 90 tablet 1  . oxyCODONE (ROXICODONE) 15 MG immediate release tablet Take 15 mg by mouth every 6 (six) hours as needed (Dr. Nelva Bush).     . potassium chloride SA (KLOR-CON M20) 20 MEQ tablet TAKE 1 TABLET BY MOUTH EVERY DAY 90 tablet 1  . sildenafil  (REVATIO) 20 MG tablet Take 1-2 tablets PO 30 minutes before desired effect. No more than 1 dose in 36 hours. 30 tablet 0  . triamcinolone ointment (KENALOG) 0.5 % APPLY TO AFFECTED AREA TWICE A DAY 30 g 0  . [DISCONTINUED] buPROPion (WELLBUTRIN XL) 150 MG 24 hr tablet Take 150 mg by mouth daily.    . [DISCONTINUED] diphenhydrAMINE (BENADRYL) 25 MG tablet Take 25 mg by mouth 2 (two) times daily as needed. For swelling     No current facility-administered medications on file prior to visit.    Allergies  Allergen Reactions  . Clonazepam     Angioedema   . Quinapril     Angioedema   . Sertraline Hcl Other (See Comments)    Suicidal thoughts    Family History  Problem Relation Age of Onset  . Cancer Father        prostate  . Colon cancer Neg Hx   . Colon polyps Neg Hx   . Esophageal cancer Neg Hx   . Rectal cancer Neg Hx   . Stomach cancer Neg Hx     Social History   Socioeconomic History  . Marital status: Single    Spouse name: Not on file  . Number of children: 2  . Years of education: Not on file  . Highest education level: Not on file  Occupational History  .  Occupation: Retired    Comment: Corporate treasurer  Tobacco Use  . Smoking status: Never Smoker  . Smokeless tobacco: Never Used  Substance and Sexual Activity  . Alcohol use: Yes    Alcohol/week: 1.0 standard drinks    Types: 1 Cans of beer per week    Comment: occasional beer  . Drug use: No  . Sexual activity: Yes  Other Topics Concern  . Not on file  Social History Narrative  . Not on file   Social Determinants of Health   Financial Resource Strain:   . Difficulty of Paying Living Expenses:   Food Insecurity:   . Worried About Charity fundraiser in the Last Year:   . Arboriculturist in the Last Year:   Transportation Needs:   . Film/video editor (Medical):   Marland Kitchen Lack of Transportation (Non-Medical):   Physical Activity:   . Days of Exercise per Week:   . Minutes of Exercise per  Session:   Stress:   . Feeling of Stress :   Social Connections:   . Frequency of Communication with Friends and Family:   . Frequency of Social Gatherings with Friends and Family:   . Attends Religious Services:   . Active Member of Clubs or Organizations:   . Attends Archivist Meetings:   Marland Kitchen Marital Status:    Review of Systems - See HPI.  All other ROS are negative.  Wt 251 lb (113.9 kg)   BMI 31.37 kg/m   Physical Exam Vitals reviewed.  Constitutional:      Appearance: Normal appearance.  HENT:     Head: Normocephalic and atraumatic.  Eyes:     Conjunctiva/sclera: Conjunctivae normal.     Pupils: Pupils are equal, round, and reactive to light.  Cardiovascular:     Rate and Rhythm: Normal rate and regular rhythm.     Pulses: Normal pulses.     Heart sounds: Normal heart sounds.  Pulmonary:     Effort: Pulmonary effort is normal.     Breath sounds: Normal breath sounds.  Musculoskeletal:     Cervical back: Neck supple.  Neurological:     General: No focal deficit present.     Mental Status: He is alert and oriented to person, place, and time.     Coordination: Coordination normal.     Gait: Gait normal.  Psychiatric:        Mood and Affect: Mood normal.     Recent Results (from the past 2160 hour(s))  Comprehensive metabolic panel     Status: Abnormal   Collection Time: 01/30/20  8:55 AM  Result Value Ref Range   Sodium 137 135 - 145 mEq/L   Potassium 3.8 3.5 - 5.1 mEq/L   Chloride 102 96 - 112 mEq/L   CO2 22 19 - 32 mEq/L   Glucose, Bld 134 (H) 70 - 99 mg/dL   BUN 13 6 - 23 mg/dL   Creatinine, Ser 1.01 0.40 - 1.50 mg/dL   Total Bilirubin 1.4 (H) 0.2 - 1.2 mg/dL   Alkaline Phosphatase 41 39 - 117 U/L   AST 32 0 - 37 U/L   ALT 22 0 - 53 U/L   Total Protein 6.5 6.0 - 8.3 g/dL   Albumin 4.2 3.5 - 5.2 g/dL   GFR 73.92 >60.00 mL/min   Calcium 9.2 8.4 - 10.5 mg/dL  Lipid panel     Status: Abnormal   Collection Time: 01/30/20  8:55 AM  Result  Value Ref Range   Cholesterol 136 0 - 200 mg/dL    Comment: ATP III Classification       Desirable:  < 200 mg/dL               Borderline High:  200 - 239 mg/dL          High:  > = 240 mg/dL   Triglycerides 210.0 (H) 0.0 - 149.0 mg/dL    Comment: Normal:  <150 mg/dLBorderline High:  150 - 199 mg/dL   HDL 26.30 (L) >39.00 mg/dL   VLDL 42.0 (H) 0.0 - 40.0 mg/dL   Total CHOL/HDL Ratio 5     Comment:                Men          Women1/2 Average Risk     3.4          3.3Average Risk          5.0          4.42X Average Risk          9.6          7.13X Average Risk          15.0          11.0                       NonHDL 109.78     Comment: NOTE:  Non-HDL goal should be 30 mg/dL higher than patient's LDL goal (i.e. LDL goal of < 70 mg/dL, would have non-HDL goal of < 100 mg/dL)  Rheumatoid Factor     Status: None   Collection Time: 01/30/20  8:55 AM  Result Value Ref Range   Rhuematoid fact SerPl-aCnc <94 <32 IU/mL  Cyclic citrul peptide antibody, IgG ([facility])     Status: None   Collection Time: 01/30/20  8:55 AM  Result Value Ref Range   Cyclic Citrullin Peptide Ab <16 UNITS    Comment: Reference Range Negative:            <20 Weak Positive:       20-39 Moderate Positive:   40-59 Strong Positive:     >59 .   LDL cholesterol, direct     Status: None   Collection Time: 01/30/20  8:55 AM  Result Value Ref Range   Direct LDL 68.0 mg/dL    Comment: Optimal:  <100 mg/dLNear or Above Optimal:  100-129 mg/dLBorderline High:  130-159 mg/dLHigh:  160-189 mg/dLVery High:  >190 mg/dL    Assessment/Plan: 1. Orthostatic hypotension Significant drop when going from lying to sitting, symptomatic.  Examination otherwise unremarkable.  Will check CMP today to assess electrolytes.  We'll have him continue his metoprolol for now.  We'll switch chlorthalidone 25 mg to HCTZ 12.5 mg once daily to see if this helps.  Discussed rising slowly from lying or seated positions.  Continue daily blood pressure checks.   Follow-up 1 week.  Sooner if needed.  ER precautions reviewed with patient.  Patient voiced understanding and agreement with plan. - Comp Met (CMET)  This visit occurred during the SARS-CoV-2 public health emergency.  Safety protocols were in place, including screening questions prior to the visit, additional usage of staff PPE, and extensive cleaning of exam room while observing appropriate contact time as indicated for disinfecting solutions.     Leeanne Rio, PA-C

## 2020-03-01 ENCOUNTER — Ambulatory Visit
Admission: RE | Admit: 2020-03-01 | Discharge: 2020-03-01 | Disposition: A | Discharge status: Home / Self Care | Payer: Medicare Other | Source: Ambulatory Visit | Attending: Chiropractic Medicine | Admitting: Chiropractic Medicine

## 2020-03-01 ENCOUNTER — Other Ambulatory Visit: Payer: Self-pay

## 2020-03-01 DIAGNOSIS — M545 Low back pain, unspecified: Secondary | ICD-10-CM

## 2020-03-14 ENCOUNTER — Encounter: Payer: Self-pay | Admitting: Physician Assistant

## 2020-03-14 ENCOUNTER — Ambulatory Visit (INDEPENDENT_AMBULATORY_CARE_PROVIDER_SITE_OTHER): Payer: Medicare Other | Admitting: Physician Assistant

## 2020-03-14 ENCOUNTER — Other Ambulatory Visit: Payer: Self-pay

## 2020-03-14 VITALS — BP 110/78 | HR 86 | Temp 98.5°F | Resp 16 | Ht 75.0 in | Wt 248.0 lb

## 2020-03-14 DIAGNOSIS — I10 Essential (primary) hypertension: Secondary | ICD-10-CM | POA: Diagnosis not present

## 2020-03-14 NOTE — Progress Notes (Signed)
Patient presents to clinic today c/o continued positional lightheadedness/off-balance sensation. Denies true vertigo. At last visit he was noted to be orthostatic. As such his Chlorthalidone 25 mg was changed to HCTZ 12.5 mg. Was continued on other medications at that time. Was instructed to monitor symptoms and home BP daily, record and follow-up in 1 week. Notes he is taking the HCTZ as directed. Is not checking BP. Did not follow-up in timeframe recommended. Has noted a good improvement in frequency and severity of symptoms but will still note positional lightheadedness on occasion. Denies any new or worsening symptoms. Still denies chest pain, racing heart, SOB.   Past Medical History:  Diagnosis Date  . Anxiety    takes xanax for anxiety  . Arthritis   . Cancer ([place])    melanoma on nose   . GERD (gastroesophageal reflux disease)   . Hyperlipidemia   . Hypertension   . Pneumonia    had pneumonia 4-5 months ago  . Recurrent upper respiratory infection (URI)    recent chest cold - treated with mucinex and cough medicine - now improved    Current Outpatient Medications on File Prior to Visit  Medication Sig Dispense Refill  . ALPRAZolam (XANAX) 1 MG tablet TAKE 1/2 TABLET BY MOUTH 3 TIMES A DAY AS NEEDED FOR ANXIETY 45 tablet 0  . atorvastatin (LIPITOR) 10 MG tablet TAKE 1 TABLET BY MOUTH EVERY DAY 90 tablet 1  . EPINEPHrine (EPI-PEN) 0.3 mg/0.3 mL DEVI Inject 0.3 mg into the muscle as needed. For anaphylaxis    . hydrochlorothiazide (HYDRODIURIL) 12.5 MG tablet Take 1 tablet (12.5 mg total) by mouth daily. 20 tablet 1  . L-Methylfolate 15 MG TABS Take 1 tablet by mouth daily.    . metoprolol succinate (TOPROL-XL) 25 MG 24 hr tablet TAKE 1 TABLET BY MOUTH EVERY DAY 90 tablet 1  . oxyCODONE (ROXICODONE) 15 MG immediate release tablet Take 15 mg by mouth every 6 (six) hours as needed (Dr. Nelva Bush).     . potassium chloride SA (KLOR-CON M20) 20 MEQ tablet TAKE 1 TABLET BY MOUTH EVERY DAY  90 tablet 1  . sildenafil (REVATIO) 20 MG tablet Take 1-2 tablets PO 30 minutes before desired effect. No more than 1 dose in 36 hours. 30 tablet 0  . triamcinolone ointment (KENALOG) 0.5 % APPLY TO AFFECTED AREA TWICE A DAY 30 g 0  . [DISCONTINUED] buPROPion (WELLBUTRIN XL) 150 MG 24 hr tablet Take 150 mg by mouth daily.    . [DISCONTINUED] diphenhydrAMINE (BENADRYL) 25 MG tablet Take 25 mg by mouth 2 (two) times daily as needed. For swelling     No current facility-administered medications on file prior to visit.    Allergies  Allergen Reactions  . Clonazepam     Angioedema   . Quinapril     Angioedema   . Sertraline Hcl Other (See Comments)    Suicidal thoughts    Family History  Problem Relation Age of Onset  . Cancer Father        prostate  . Colon cancer Neg Hx   . Colon polyps Neg Hx   . Esophageal cancer Neg Hx   . Rectal cancer Neg Hx   . Stomach cancer Neg Hx     Social History   Socioeconomic History  . Marital status: Single    Spouse name: Not on file  . Number of children: 2  . Years of education: Not on file  . Highest education level: Not on  file  Occupational History  . Occupation: Retired    Comment: Corporate treasurer  Tobacco Use  . Smoking status: Never Smoker  . Smokeless tobacco: Never Used  Substance and Sexual Activity  . Alcohol use: Yes    Alcohol/week: 1.0 standard drinks    Types: 1 Cans of beer per week    Comment: occasional beer  . Drug use: No  . Sexual activity: Yes  Other Topics Concern  . Not on file  Social History Narrative  . Not on file   Social Determinants of Health   Financial Resource Strain:   . Difficulty of Paying Living Expenses:   Food Insecurity:   . Worried About Charity fundraiser in the Last Year:   . Arboriculturist in the Last Year:   Transportation Needs:   . Film/video editor (Medical):   Marland Kitchen Lack of Transportation (Non-Medical):   Physical Activity:   . Days of Exercise per Week:   .  Minutes of Exercise per Session:   Stress:   . Feeling of Stress :   Social Connections:   . Frequency of Communication with Friends and Family:   . Frequency of Social Gatherings with Friends and Family:   . Attends Religious Services:   . Active Member of Clubs or Organizations:   . Attends Archivist Meetings:   Marland Kitchen Marital Status:    Review of Systems - See HPI.  All other ROS are negative.  Ht _0  (1.905 m)   Wt 248 lb (112.5 kg)   BMI 31.00 kg/m   Physical Exam Vitals reviewed.  Constitutional:      Appearance: Normal appearance.  HENT:     Head: Normocephalic and atraumatic.  Eyes:     Conjunctiva/sclera: Conjunctivae normal.     Pupils: Pupils are equal, round, and reactive to light.  Cardiovascular:     Rate and Rhythm: Normal rate and regular rhythm.     Pulses: Normal pulses.     Heart sounds: Normal heart sounds.  Pulmonary:     Effort: Pulmonary effort is normal.     Breath sounds: Normal breath sounds.  Musculoskeletal:     Cervical back: Neck supple.  Neurological:     General: No focal deficit present.     Mental Status: He is alert and oriented to person, place, and time.     Motor: No weakness.     Coordination: Coordination normal.     Gait: Gait normal.  Psychiatric:        Mood and Affect: Mood normal.     Recent Results (from the past 2160 hour(s))  Comprehensive metabolic panel     Status: Abnormal   Collection Time: 01/30/20  8:55 AM  Result Value Ref Range   Sodium 137 135 - 145 mEq/L   Potassium 3.8 3.5 - 5.1 mEq/L   Chloride 102 96 - 112 mEq/L   CO2 22 19 - 32 mEq/L   Glucose, Bld 134 (H) 70 - 99 mg/dL   BUN 13 6 - 23 mg/dL   Creatinine, Ser 1.01 0.40 - 1.50 mg/dL   Total Bilirubin 1.4 (H) 0.2 - 1.2 mg/dL   Alkaline Phosphatase 41 39 - 117 U/L   AST 32 0 - 37 U/L   ALT 22 0 - 53 U/L   Total Protein 6.5 6.0 - 8.3 g/dL   Albumin 4.2 3.5 - 5.2 g/dL   GFR 73.92 >60.00 mL/min   Calcium 9.2 8.4 - 10.5  mg/dL  Lipid panel      Status: Abnormal   Collection Time: 01/30/20  8:55 AM  Result Value Ref Range   Cholesterol 136 0 - 200 mg/dL    Comment: ATP III Classification       Desirable:  < 200 mg/dL               Borderline High:  200 - 239 mg/dL          High:  > = 240 mg/dL   Triglycerides 210.0 (H) 0.0 - 149.0 mg/dL    Comment: Normal:  <150 mg/dLBorderline High:  150 - 199 mg/dL   HDL 26.30 (L) >39.00 mg/dL   VLDL 42.0 (H) 0.0 - 40.0 mg/dL   Total CHOL/HDL Ratio 5     Comment:                Men          Women1/2 Average Risk     3.4          3.3Average Risk          5.0          4.42X Average Risk          9.6          7.13X Average Risk          15.0          11.0                       NonHDL 109.78     Comment: NOTE:  Non-HDL goal should be 30 mg/dL higher than patient's LDL goal (i.e. LDL goal of < 70 mg/dL, would have non-HDL goal of < 100 mg/dL)  Rheumatoid Factor     Status: None   Collection Time: 01/30/20  8:55 AM  Result Value Ref Range   Rhuematoid fact SerPl-aCnc <85 <46 IU/mL  Cyclic citrul peptide antibody, IgG ([facility])     Status: None   Collection Time: 01/30/20  8:55 AM  Result Value Ref Range   Cyclic Citrullin Peptide Ab <16 UNITS    Comment: Reference Range Negative:            <20 Weak Positive:       20-39 Moderate Positive:   40-59 Strong Positive:     >59 .   LDL cholesterol, direct     Status: None   Collection Time: 01/30/20  8:55 AM  Result Value Ref Range   Direct LDL 68.0 mg/dL    Comment: Optimal:  <100 mg/dLNear or Above Optimal:  100-129 mg/dLBorderline High:  130-159 mg/dLHigh:  160-189 mg/dLVery High:  >190 mg/dL  Comp Met (CMET)     Status: Abnormal   Collection Time: 02/27/20 10:25 AM  Result Value Ref Range   Sodium 136 135 - 145 mEq/L   Potassium 3.7 3.5 - 5.1 mEq/L   Chloride 97 96 - 112 mEq/L   CO2 29 19 - 32 mEq/L   Glucose, Bld 155 (H) 70 - 99 mg/dL   BUN 20 6 - 23 mg/dL   Creatinine, Ser 0.87 0.40 - 1.50 mg/dL   Total Bilirubin 0.9 0.2 - 1.2 mg/dL     Alkaline Phosphatase 62 39 - 117 U/L   AST 44 (H) 0 - 37 U/L   ALT 37 0 - 53 U/L   Total Protein 6.0 6.0 - 8.3 g/dL   Albumin 3.2 (L) 3.5 - 5.2 g/dL   GFR  87.79 >60.00 mL/min   Calcium 8.4 8.4 - 10.5 mg/dL    Assessment/Plan: 1. Essential hypertension BP more stable with HCTZ versus chlorthalidone. OVS negative today. Symptoms much improved but still occasional lightheadedness/off-balance with standing. Neuro exam today unremarkable. Will hold HCTZ. Continue on Metoprolol. Continue good hydration and well-balanced diet. Check BP at home every couple of days and when symptomatic. Follow-up if symptoms not resolving and BP not remaining stable.   This visit occurred during the SARS-CoV-2 public health emergency.  Safety protocols were in place, including screening questions prior to the visit, additional usage of staff PPE, and extensive cleaning of exam room while observing appropriate contact time as indicated for disinfecting solutions.     Leeanne Rio, PA-C

## 2020-03-14 NOTE — Patient Instructions (Signed)
I want you to hold the HCTZ over the next 10-14 days. Continue other medications as directed. Keep a check on BP at home, at least every few days and write this down.   I want to see if symptoms completely resolve with holding this medicine, especially since your symptoms already improved with the change at last visit.   Keep well-hydrated. Make sure to keep a well-balanced diet.

## 2020-04-06 ENCOUNTER — Ambulatory Visit (INDEPENDENT_AMBULATORY_CARE_PROVIDER_SITE_OTHER): Payer: Medicare Other

## 2020-04-06 ENCOUNTER — Other Ambulatory Visit: Payer: Self-pay

## 2020-04-06 DIAGNOSIS — E782 Mixed hyperlipidemia: Secondary | ICD-10-CM

## 2020-04-06 DIAGNOSIS — Z79899 Other long term (current) drug therapy: Secondary | ICD-10-CM | POA: Diagnosis not present

## 2020-04-06 LAB — LIPID PANEL
Cholesterol: 129 mg/dL (ref 0–200)
HDL: 24.6 mg/dL — ABNORMAL LOW (ref >39.00)
LDL Cholesterol: 82 mg/dL (ref 0–99)
NonHDL: 104.82
Total CHOL/HDL Ratio: 5
Triglycerides: 114 mg/dL (ref 0.0–149.0)
VLDL: 22.8 mg/dL (ref 0.0–40.0)

## 2020-04-06 LAB — HEPATIC FUNCTION PANEL
ALT: 15 U/L (ref 0–53)
AST: 14 U/L (ref 0–37)
Albumin: 3.4 g/dL — ABNORMAL LOW (ref 3.5–5.2)
Alkaline Phosphatase: 49 U/L (ref 39–117)
Bilirubin, Direct: 0.2 mg/dL (ref 0.0–0.3)
Total Bilirubin: 0.8 mg/dL (ref 0.2–1.2)
Total Protein: 6.1 g/dL (ref 6.0–8.3)

## 2020-04-13 ENCOUNTER — Other Ambulatory Visit: Payer: Self-pay | Admitting: Physician Assistant

## 2020-04-26 ENCOUNTER — Other Ambulatory Visit: Payer: Self-pay | Admitting: Physician Assistant

## 2020-04-26 NOTE — Telephone Encounter (Signed)
Xanax last rx 02/24/20 #45 LOV: 03/14/20 HTN CSC: 05/11/18

## 2020-05-29 ENCOUNTER — Other Ambulatory Visit: Payer: Self-pay | Admitting: Physician Assistant

## 2020-05-29 NOTE — Telephone Encounter (Signed)
Xanax last rx 04/26/20 #45 LOV: 04/02/20 HTN Next OV: 06/19/20 CSC: 05/11/18

## 2020-06-13 ENCOUNTER — Other Ambulatory Visit: Payer: Self-pay | Admitting: Physician Assistant

## 2020-06-19 ENCOUNTER — Ambulatory Visit: Payer: Medicare Other | Admitting: Physician Assistant

## 2020-06-20 ENCOUNTER — Ambulatory Visit: Payer: Medicare Other | Admitting: Physician Assistant

## 2020-06-22 ENCOUNTER — Telehealth: Payer: Self-pay | Admitting: Physician Assistant

## 2020-06-22 NOTE — Telephone Encounter (Signed)
Spoke with patient about testing positive for Covid at [facility] He states his only symptoms is fatigued. He is unsure of who he was around to contact Covid Recommended to stay well hydrated, rest, quarantine away from others from 10 days of onset of symptoms Recommend taking Vitamin D, Vitamin C and Zinc If symptoms worsens then he will need to be seen for evaluation

## 2020-06-22 NOTE — Telephone Encounter (Signed)
Patient tested positive for Covid at [facility] - He would like to know what his treatment options are and about quarantine.  Please advise

## 2020-07-10 ENCOUNTER — Encounter: Payer: Self-pay | Admitting: Physician Assistant

## 2020-07-10 ENCOUNTER — Ambulatory Visit (INDEPENDENT_AMBULATORY_CARE_PROVIDER_SITE_OTHER): Payer: Medicare Other | Admitting: Physician Assistant

## 2020-07-10 ENCOUNTER — Other Ambulatory Visit: Payer: Self-pay

## 2020-07-10 VITALS — BP 118/76 | HR 70 | Temp 98.3°F | Resp 16 | Ht 75.0 in | Wt 255.0 lb

## 2020-07-10 DIAGNOSIS — I10 Essential (primary) hypertension: Secondary | ICD-10-CM | POA: Diagnosis not present

## 2020-07-10 DIAGNOSIS — N529 Male erectile dysfunction, unspecified: Secondary | ICD-10-CM | POA: Diagnosis not present

## 2020-07-10 MED ORDER — SILDENAFIL CITRATE 20 MG PO TABS: ORAL_TABLET | ORAL | 0 refills | Status: DC

## 2020-07-10 NOTE — Patient Instructions (Addendum)
Please continue just the Metoprolol XL daily for blood pressure.   I have sent the generic Viagra to [facility] in [place] for you as discussed. Hydrate well before use.  Continue a well-balanced diet.

## 2020-07-10 NOTE — Progress Notes (Signed)
Patient presents to clinic today for follow-up of BP. Patient was having episodes of orthostatic lightheadedness. Initially his Chlorthalidone was discontinued and he was placed on HCTZ. Continue on Metoprolol. Symptoms greatly improved but were still present so at last follow-up the HCTZ was discontinued. Patient has remained on his Metoprolol taking as directed. Patient denies chest pain, palpitations, lightheadedness, dizziness, vision changes or frequent headaches.  BP Readings from Last 3 Encounters:  07/10/20 118/76  04/06/20 112/68  03/14/20 110/78     Past Medical History:  Diagnosis Date  . Anxiety    takes xanax for anxiety  . Arthritis   . Cancer ([place])    melanoma on nose   . GERD (gastroesophageal reflux disease)   . Hyperlipidemia   . Hypertension   . Pneumonia    had pneumonia 4-5 months ago  . Recurrent upper respiratory infection (URI)    recent chest cold - treated with mucinex and cough medicine - now improved    Current Outpatient Medications on File Prior to Visit  Medication Sig Dispense Refill  . ALPRAZolam (XANAX) 1 MG tablet TAKE 1/2 TABLET BY MOUTH 3 TIMES A DAY AS NEEDED FOR ANXIETY 45 tablet 0  . atorvastatin (LIPITOR) 10 MG tablet TAKE 1 TABLET BY MOUTH EVERY DAY 90 tablet 1  . EPINEPHrine (EPI-PEN) 0.3 mg/0.3 mL DEVI Inject 0.3 mg into the muscle as needed. For anaphylaxis    . metoprolol succinate (TOPROL-XL) 25 MG 24 hr tablet TAKE 1 TABLET BY MOUTH EVERY DAY 90 tablet 1  . oxyCODONE (ROXICODONE) 15 MG immediate release tablet Take 15 mg by mouth every 6 (six) hours as needed (Dr. Nelva Bush).     . potassium chloride SA (KLOR-CON M20) 20 MEQ tablet TAKE 1 TABLET BY MOUTH EVERY DAY 90 tablet 1  . sildenafil (REVATIO) 20 MG tablet Take 1-2 tablets PO 30 minutes before desired effect. No more than 1 dose in 36 hours. 30 tablet 0  . triamcinolone ointment (KENALOG) 0.5 % APPLY TO AFFECTED AREA TWICE A DAY 30 g 0  . hydrochlorothiazide (HYDRODIURIL) 12.5  MG tablet Take 1 tablet (12.5 mg total) by mouth daily. (Patient not taking: Reported on 07/10/2020) 20 tablet 1  . L-Methylfolate 15 MG TABS Take 1 tablet by mouth daily. (Patient not taking: Reported on 07/10/2020)    . [DISCONTINUED] buPROPion (WELLBUTRIN XL) 150 MG 24 hr tablet Take 150 mg by mouth daily.    . [DISCONTINUED] diphenhydrAMINE (BENADRYL) 25 MG tablet Take 25 mg by mouth 2 (two) times daily as needed. For swelling     No current facility-administered medications on file prior to visit.    Allergies  Allergen Reactions  . Clonazepam     Angioedema   . Quinapril     Angioedema   . Sertraline Hcl Other (See Comments)    Suicidal thoughts    Family History  Problem Relation Age of Onset  . Cancer Father        prostate  . Colon cancer Neg Hx   . Colon polyps Neg Hx   . Esophageal cancer Neg Hx   . Rectal cancer Neg Hx   . Stomach cancer Neg Hx     Social History   Socioeconomic History  . Marital status: Single    Spouse name: Not on file  . Number of children: 2  . Years of education: Not on file  . Highest education level: Not on file  Occupational History  . Occupation: Retired  Comment: Service Department  Tobacco Use  . Smoking status: Never Smoker  . Smokeless tobacco: Never Used  Vaping Use  . Vaping Use: Never used  Substance and Sexual Activity  . Alcohol use: Yes    Alcohol/week: 1.0 standard drink    Types: 1 Cans of beer per week    Comment: occasional beer  . Drug use: No  . Sexual activity: Yes  Other Topics Concern  . Not on file  Social History Narrative  . Not on file   Social Determinants of Health   Financial Resource Strain:   . Difficulty of Paying Living Expenses: Not on file  Food Insecurity:   . Worried About Charity fundraiser in the Last Year: Not on file  . Ran Out of Food in the Last Year: Not on file  Transportation Needs:   . Lack of Transportation (Medical): Not on file  . Lack of Transportation  (Non-Medical): Not on file  Physical Activity:   . Days of Exercise per Week: Not on file  . Minutes of Exercise per Session: Not on file  Stress:   . Feeling of Stress : Not on file  Social Connections:   . Frequency of Communication with Friends and Family: Not on file  . Frequency of Social Gatherings with Friends and Family: Not on file  . Attends Religious Services: Not on file  . Active Member of Clubs or Organizations: Not on file  . Attends Archivist Meetings: Not on file  . Marital Status: Not on file   Review of Systems - See HPI.  All other ROS are negative.  BP 118/76   Pulse 70   Temp 98.3 F (36.8 C) (Temporal)   Resp 16   Ht 6\' 3"  (1.905 m)   Wt 255 lb (115.7 kg)   SpO2 96%   BMI 31.87 kg/m   Physical Exam Vitals reviewed.  Constitutional:      Appearance: Normal appearance.  HENT:     Head: Normocephalic and atraumatic.  Cardiovascular:     Rate and Rhythm: Normal rate and regular rhythm.     Pulses: Normal pulses.     Heart sounds: Normal heart sounds.  Musculoskeletal:     Cervical back: Neck supple.  Neurological:     General: No focal deficit present.     Mental Status: He is alert and oriented to person, place, and time.  Psychiatric:        Mood and Affect: Mood normal.    Assessment/Plan: 1. Essential hypertension BP normotensive with current regimen. Asymptomatic. No recurrence of orthostatic lightheadedness. Continue current regimen. Proper diet and hydration again reviewed.   2. Erectile dysfunction, unspecified erectile dysfunction type Requesting refill of generic viagra. Rx sent.  - sildenafil (REVATIO) 20 MG tablet; Take 1-2 tablets PO 30 minutes before desired effect. No more than 1 dose in 36 hours.  Dispense: 30 tablet; Refill: 0  This visit occurred during the SARS-CoV-2 public health emergency.  Safety protocols were in place, including screening questions prior to the visit, additional usage of staff PPE, and  extensive cleaning of exam room while observing appropriate contact time as indicated for disinfecting solutions.     Leeanne Rio, PA-C

## 2020-07-27 ENCOUNTER — Other Ambulatory Visit: Payer: Self-pay | Admitting: Physician Assistant

## 2020-08-24 ENCOUNTER — Other Ambulatory Visit: Payer: Self-pay | Admitting: Physician Assistant

## 2020-08-24 NOTE — Telephone Encounter (Signed)
Xanax last rx 07/30/20 #45 LOV: 9/28/21HTN, ED CSC: 05/25/18

## 2020-08-29 ENCOUNTER — Other Ambulatory Visit: Payer: Self-pay | Admitting: Physician Assistant

## 2020-08-29 DIAGNOSIS — I10 Essential (primary) hypertension: Secondary | ICD-10-CM

## 2020-08-30 NOTE — Progress Notes (Signed)
Subjective:   Wayne Rhodes is a 64 y.o. male who presents for Medicare Annual/Subsequent preventive examination.  I connected with Jamarius today by telephone and verified that I am speaking with the correct person using two identifiers. Location patient: home Location provider: work Persons participating in the virtual visit: patient, Marine scientist.    I discussed the limitations, risks, security and privacy concerns of performing an evaluation and management service by telephone and the availability of in person appointments. I also discussed with the patient that there may be a patient responsible charge related to this service. The patient expressed understanding and verbally consented to this telephonic visit.    Interactive audio and video telecommunications were attempted between this provider and patient, however failed, due to patient having technical difficulties OR patient did not have access to video capability.  We continued and completed visit with audio only.  Some vital signs may be absent or patient reported.   Time Spent with patient on telephone encounter: 20 minutes   Review of Systems     Cardiac Risk Factors include: advanced age (>94men, >20 women);male gender;hypertension;dyslipidemia;obesity (BMI >30kg/m2)     Objective:    Today's Vitals   09/03/20 0945  Weight: 255 lb (115.7 kg)  Height: 6\' 3"  (1.905 m)   Body mass index is 31.87 kg/m.  Advanced Directives 09/03/2020 08/31/2019 09/26/2011 09/25/2011 09/17/2011  Does Patient Have a Medical Advance Directive? No No Patient would not like information Patient does not have advance directive;Patient would not like information Patient does not have advance directive  Would patient like information on creating a medical advance directive? Yes (MAU/Ambulatory/Procedural Areas - Information given) Yes (MAU/Ambulatory/Procedural Areas - Information given) - - -  Pre-existing out of facility DNR order (yellow form or  pink MOST form) - - No No -    Current Medications (verified) Outpatient Encounter Medications as of 09/03/2020  Medication Sig  . ALPRAZolam (XANAX) 1 MG tablet TAKE 1/2 TABLET BY MOUTH 3 TIMES A DAY AS NEEDED FOR ANXIETY  . atorvastatin (LIPITOR) 10 MG tablet TAKE 1 TABLET BY MOUTH EVERY DAY  . EPINEPHrine (EPI-PEN) 0.3 mg/0.3 mL DEVI Inject 0.3 mg into the muscle as needed. For anaphylaxis  . metoprolol succinate (TOPROL-XL) 25 MG 24 hr tablet TAKE 1 TABLET BY MOUTH EVERY DAY  . oxyCODONE (ROXICODONE) 15 MG immediate release tablet Take 15 mg by mouth every 6 (six) hours as needed (Dr. Nelva Bush).   . triamcinolone ointment (KENALOG) 0.5 % APPLY TO AFFECTED AREA TWICE A DAY  . L-Methylfolate 15 MG TABS Take 1 tablet by mouth daily. (Patient not taking: Reported on 07/10/2020)  . sildenafil (REVATIO) 20 MG tablet Take 1-2 tablets PO 30 minutes before desired effect. No more than 1 dose in 36 hours. (Patient not taking: Reported on 09/03/2020)  . [DISCONTINUED] buPROPion (WELLBUTRIN XL) 150 MG 24 hr tablet Take 150 mg by mouth daily.  . [DISCONTINUED] diphenhydrAMINE (BENADRYL) 25 MG tablet Take 25 mg by mouth 2 (two) times daily as needed. For swelling   No facility-administered encounter medications on file as of 09/03/2020.    Allergies (verified) Clonazepam, Quinapril, and Sertraline hcl   History: Past Medical History:  Diagnosis Date  . Anxiety    takes xanax for anxiety  . Arthritis   . Cancer ([place])    melanoma on nose   . GERD (gastroesophageal reflux disease)   . Hyperlipidemia   . Hypertension   . Pneumonia    had pneumonia 4-5 months ago  .  Recurrent upper respiratory infection (URI)    recent chest cold - treated with mucinex and cough medicine - now improved   Past Surgical History:  Procedure Laterality Date  . ANTERIOR CERVICAL DECOMP/DISCECTOMY FUSION  09/25/2011   Procedure: ANTERIOR CERVICAL DECOMPRESSION/DISCECTOMY FUSION 1 LEVEL;  Surgeon: Dahlia Bailiff;   Location: [facility];  Service: Orthopedics;  Laterality: N/A;  ACDF Cervical 5-6  . COLONOSCOPY    . ELBOW SURGERY     both elbow surgery  . KNEE ARTHROSCOPY     left   Family History  Problem Relation Age of Onset  . Cancer Father        prostate  . Colon cancer Neg Hx   . Colon polyps Neg Hx   . Esophageal cancer Neg Hx   . Rectal cancer Neg Hx   . Stomach cancer Neg Hx    Social History   Socioeconomic History  . Marital status: Single    Spouse name: Not on file  . Number of children: 2  . Years of education: Not on file  . Highest education level: Not on file  Occupational History  . Occupation: Retired    Comment: Corporate treasurer  Tobacco Use  . Smoking status: Never Smoker  . Smokeless tobacco: Never Used  Vaping Use  . Vaping Use: Never used  Substance and Sexual Activity  . Alcohol use: Yes    Alcohol/week: 1.0 standard drink    Types: 1 Cans of beer per week    Comment: occasional beer  . Drug use: No  . Sexual activity: Yes  Other Topics Concern  . Not on file  Social History Narrative  . Not on file   Social Determinants of Health   Financial Resource Strain: Low Risk   . Difficulty of Paying Living Expenses: Not hard at all  Food Insecurity: No Food Insecurity  . Worried About Charity fundraiser in the Last Year: Never true  . Ran Out of Food in the Last Year: Never true  Transportation Needs: No Transportation Needs  . Lack of Transportation (Medical): No  . Lack of Transportation (Non-Medical): No  Physical Activity: Insufficiently Active  . Days of Exercise per Week: 2 days  . Minutes of Exercise per Session: 20 min  Stress: No Stress Concern Present  . Feeling of Stress : Not at all  Social Connections: Socially Isolated  . Frequency of Communication with Friends and Family: More than three times a week  . Frequency of Social Gatherings with Friends and Family: More than three times a week  . Attends Religious Services: Never  . Active  Member of Clubs or Organizations: No  . Attends Archivist Meetings: Never  . Marital Status: Never married    Tobacco Counseling Counseling given: Not Answered   Clinical Intake:  Pre-visit preparation completed: Yes  Pain : No/denies pain     Nutritional Status: BMI > 30  Obese Nutritional Risks: None Diabetes: No  How often do you need to have someone help you when you read instructions, pamphlets, or other written materials from your doctor or pharmacy?: 1 - Never What is the last grade level you completed in school?: 12th grade  Diabetic?No  Interpreter Needed?: No  Information entered by :: Caroleen Hamman LPN   Activities of Daily Living In your present state of health, do you have any difficulty performing the following activities: 09/03/2020  Hearing? N  Vision? N  Difficulty concentrating or making decisions? N  Walking or climbing stairs? N  Dressing or bathing? N  Doing errands, shopping? N  Preparing Food and eating ? N  Using the Toilet? N  In the past six months, have you accidently leaked urine? N  Do you have problems with loss of bowel control? N  Managing your Medications? N  Managing your Finances? N  Housekeeping or managing your Housekeeping? N  Some recent data might be hidden    Patient Care Team: Delorse Limber as PCP - General (Family Medicine) Suella Broad, MD as Consulting Physician (Physical Medicine and Rehabilitation)  Indicate any recent Medical Services you may have received from other than [facility] providers in the past year (date may be approximate).     Assessment:   This is a routine wellness examination for [facility].  Hearing/Vision screen  Hearing Screening   125Hz  250Hz  500Hz  1000Hz  2000Hz  3000Hz  4000Hz  6000Hz  8000Hz   Right ear:           Left ear:           Comments: No issues  Vision Screening Comments: Reading glasses  Dietary issues and exercise activities discussed: Current Exercise Habits:  Home exercise routine, Type of exercise: walking, Time (Minutes): 15, Frequency (Times/Week): 2, Weekly Exercise (Minutes/Week): 30, Exercise limited by: None identified  Goals    . Patient Stated     Drink more water      Depression Screen PHQ 2/9 Scores 09/03/2020 07/10/2020 01/30/2020 10/20/2019 07/18/2019 02/28/2019 06/10/2018  PHQ - 2 Score 0 0 0 0 2 0 0  PHQ- 9 Score - 0 1 0 - 0 0    Fall Risk Fall Risk  09/03/2020 08/31/2019 02/28/2019 12/26/2016  Falls in the past year? 0 0 0 No  Number falls in past yr: 0 - 0 -  Injury with Fall? 0 0 0 -  Follow up Falls prevention discussed Falls evaluation completed;Education provided;Falls prevention discussed Falls evaluation completed -    Any stairs in or around the home? No  Home free of loose throw rugs in walkways, pet beds, electrical cords, etc? Yes  Adequate lighting in your home to reduce risk of falls? Yes   ASSISTIVE DEVICES UTILIZED TO PREVENT FALLS:  Life alert? No  Use of a cane, walker or w/c? No  Grab bars in the bathroom? Yes  Shower chair or bench in shower? No  Elevated toilet seat or a handicapped toilet? No   TIMED UP AND GO:  Was the test performed? No . Phone visit   Cognitive Function:No cognitive impairment noted        Immunizations  There is no immunization history on file for this patient.  TDAP status: Due, Education has been provided regarding the importance of this vaccine. Advised may receive this vaccine at local pharmacy or Health Dept. Aware to provide a copy of the vaccination record if obtained from local pharmacy or Health Dept. Verbalized acceptance and understanding.   Flu Vaccine status: Declined, Education has been provided regarding the importance of this vaccine but patient still declined. Advised may receive this vaccine at local pharmacy or Health Dept. Aware to provide a copy of the vaccination record if obtained from local pharmacy or Health Dept. Verbalized acceptance and  understanding.   Pneumococcal vaccine status: Declined,  Education has been provided regarding the importance of this vaccine but patient still declined. Advised may receive this vaccine at local pharmacy or Health Dept. Aware to provide a copy of the vaccination record if obtained from  local pharmacy or Health Dept. Verbalized acceptance and understanding.    Covid-19 vaccine status: Declined, Education has been provided regarding the importance of this vaccine but patient still declined. Advised may receive this vaccine at local pharmacy or Health Dept.or vaccine clinic. Aware to provide a copy of the vaccination record if obtained from local pharmacy or Health Dept. Verbalized acceptance and understanding.  Qualifies for Shingles Vaccine? Yes   Zostavax completed No   Shingrix Completed?: No.    Education has been provided regarding the importance of this vaccine. Patient has been advised to call insurance company to determine out of pocket expense if they have not yet received this vaccine. Advised may also receive vaccine at local pharmacy or Health Dept. Verbalized acceptance and understanding.  Screening Tests Health Maintenance  Topic Date Due  . Hepatitis C Screening  Never done  . COVID-19 Vaccine (1) Never done  . TETANUS/TDAP  Never done  . PNA vac Low Risk Adult (1 of 2 - PCV13) Never done  . DTAP VACCINES (1) 03/22/2079 (Originally 04/18/1956)  . INFLUENZA VACCINE  12/19/2087 (Originally 05/13/2020)  . COLONOSCOPY  06/02/2022  . DTaP/Tdap/Td  Discontinued    Health Maintenance  Health Maintenance Due  Topic Date Due  . Hepatitis C Screening  Never done  . COVID-19 Vaccine (1) Never done  . TETANUS/TDAP  Never done  . PNA vac Low Risk Adult (1 of 2 - PCV13) Never done    Colorectal cancer screening: Completed Colonoscopy 06/03/2019. Repeat every 3 years  Lung Cancer Screening: (Low Dose CT Chest recommended if Age 34-80 years, 30 pack-year currently smoking OR have quit  w/in 15years.) does not qualify.     Additional Screening:  Hepatitis C Screening: does qualify; Discuss with PCP  Vision Screening: Recommended annual ophthalmology exams for early detection of glaucoma and other disorders of the eye. Is the patient up to date with their annual eye exam?  No  Who is the provider or what is the name of the office in which the patient attends annual eye exams? Unsure of name Patient to make an appt  Dental Screening: Recommended annual dental exams for proper oral hygiene  Community Resource Referral / Chronic Care Management: CRR required this visit?  No   CCM required this visit?  No      Plan:     I have personally reviewed and noted the following in the patient's chart:   . Medical and social history . Use of alcohol, tobacco or illicit drugs  . Current medications and supplements . Functional ability and status . Nutritional status . Physical activity . Advanced directives . List of other physicians . Hospitalizations, surgeries, and ER visits in previous 12 months . Vitals . Screenings to include cognitive, depression, and falls . Referrals and appointments  In addition, I have reviewed and discussed with patient certain preventive protocols, quality metrics, and best practice recommendations. A written personalized care plan for preventive services as well as general preventive health recommendations were provided to patient.   Due to this being a telephonic visit, the after visit summary with patients personalized plan was offered to patient via mail or my-chart. Patient declined avs today.   Marta Antu, LPN   [id number]  Nurse Health Advisor  Nurse Notes: None

## 2020-09-03 ENCOUNTER — Other Ambulatory Visit: Payer: Self-pay | Admitting: Physician Assistant

## 2020-09-03 ENCOUNTER — Ambulatory Visit (INDEPENDENT_AMBULATORY_CARE_PROVIDER_SITE_OTHER): Payer: Medicare Other

## 2020-09-03 ENCOUNTER — Telehealth: Payer: Self-pay

## 2020-09-03 VITALS — Ht 75.0 in | Wt 255.0 lb

## 2020-09-03 DIAGNOSIS — Z0000 Encounter for general adult medical examination without abnormal findings: Secondary | ICD-10-CM

## 2020-09-03 MED ORDER — TRIAMCINOLONE ACETONIDE 0.5 % EX OINT: TOPICAL_OINTMENT | CUTANEOUS | 0 refills | Status: DC

## 2020-09-03 NOTE — Telephone Encounter (Signed)
Patient is requesting a refill of triamcinolone ointment. Uses [facility].

## 2020-09-03 NOTE — Telephone Encounter (Signed)
Refill sent.

## 2020-09-03 NOTE — Patient Instructions (Signed)
Wayne Rhodes , Thank you for taking time to complete your Medicare Wellness Visit. I appreciate your ongoing commitment to your health goals. Please review the following plan we discussed and let me know if I can assist you in the future.   Screening recommendations/referrals: Colonoscopy: Completed 06/03/2019-Due 06/02/2022 Recommended yearly ophthalmology/optometry visit for glaucoma screening and checkup Recommended yearly dental visit for hygiene and checkup  Vaccinations: Influenza vaccine: Declined Pneumococcal vaccine: Declined Tdap vaccine: Declined Shingles vaccine:   Declined Covid-19: Declined  Advanced directives: Information mailed today  Conditions/risks identified: See problem list  Next appointment: Follow up in one year for your annual wellness visit.   Preventive Care 15 Years and Older, Male Preventive care refers to lifestyle choices and visits with your health care provider that can promote health and wellness. What does preventive care include?  A yearly physical exam. This is also called an annual well check.  Dental exams once or twice a year.  Routine eye exams. Ask your health care provider how often you should have your eyes checked.  Personal lifestyle choices, including:  Daily care of your teeth and gums.  Regular physical activity.  Eating a healthy diet.  Avoiding tobacco and drug use.  Limiting alcohol use.  Practicing safe sex.  Taking low doses of aspirin every day.  Taking vitamin and mineral supplements as recommended by your health care provider. What happens during an annual well check? The services and screenings done by your health care provider during your annual well check will depend on your age, overall health, lifestyle risk factors, and family history of disease. Counseling  Your health care provider may ask you questions about your:  Alcohol use.  Tobacco use.  Drug use.  Emotional well-being.  Home and  relationship well-being.  Sexual activity.  Eating habits.  History of falls.  Memory and ability to understand (cognition).  Work and work Statistician. Screening  You may have the following tests or measurements:  Height, weight, and BMI.  Blood pressure.  Lipid and cholesterol levels. These may be checked every 5 years, or more frequently if you are over 18 years old.  Skin check.  Lung cancer screening. You may have this screening every year starting at age 78 if you have a 30-pack-year history of smoking and currently smoke or have quit within the past 15 years.  Fecal occult blood test (FOBT) of the stool. You may have this test every year starting at age 39.  Flexible sigmoidoscopy or colonoscopy. You may have a sigmoidoscopy every 5 years or a colonoscopy every 10 years starting at age 86.  Prostate cancer screening. Recommendations will vary depending on your family history and other risks.  Hepatitis C blood test.  Hepatitis B blood test.  Sexually transmitted disease (STD) testing.  Diabetes screening. This is done by checking your blood sugar (glucose) after you have not eaten for a while (fasting). You may have this done every 1-3 years.  Abdominal aortic aneurysm (AAA) screening. You may need this if you are a current or former smoker.  Osteoporosis. You may be screened starting at age 29 if you are at high risk. Talk with your health care provider about your test results, treatment options, and if necessary, the need for more tests. Vaccines  Your health care provider may recommend certain vaccines, such as:  Influenza vaccine. This is recommended every year.  Tetanus, diphtheria, and acellular pertussis (Tdap, Td) vaccine. You may need a Td booster every 10 years.  Zoster vaccine. You may need this after age 61.  Pneumococcal 13-valent conjugate (PCV13) vaccine. One dose is recommended after age 54.  Pneumococcal polysaccharide (PPSV23) vaccine. One  dose is recommended after age 41. Talk to your health care provider about which screenings and vaccines you need and how often you need them. This information is not intended to replace advice given to you by your health care provider. Make sure you discuss any questions you have with your health care provider. Document Released: 10/26/2015 Document Revised: 06/18/2016 Document Reviewed: 07/31/2015 Elsevier Interactive Patient Education  2017 [place] Prevention in the Home Falls can cause injuries. They can happen to people of all ages. There are many things you can do to make your home safe and to help prevent falls. What can I do on the outside of my home?  Regularly fix the edges of walkways and driveways and fix any cracks.  Remove anything that might make you trip as you walk through a door, such as a raised step or threshold.  Trim any bushes or trees on the path to your home.  Use bright outdoor lighting.  Clear any walking paths of anything that might make someone trip, such as rocks or tools.  Regularly check to see if handrails are loose or broken. Make sure that both sides of any steps have handrails.  Any raised decks and porches should have guardrails on the edges.  Have any leaves, snow, or ice cleared regularly.  Use sand or salt on walking paths during winter.  Clean up any spills in your garage right away. This includes oil or grease spills. What can I do in the bathroom?  Use night lights.  Install grab bars by the toilet and in the tub and shower. Do not use towel bars as grab bars.  Use non-skid mats or decals in the tub or shower.  If you need to sit down in the shower, use a plastic, non-slip stool.  Keep the floor dry. Clean up any water that spills on the floor as soon as it happens.  Remove soap buildup in the tub or shower regularly.  Attach bath mats securely with double-sided non-slip rug tape.  Do not have throw rugs and other  things on the floor that can make you trip. What can I do in the bedroom?  Use night lights.  Make sure that you have a light by your bed that is easy to reach.  Do not use any sheets or blankets that are too big for your bed. They should not hang down onto the floor.  Have a firm chair that has side arms. You can use this for support while you get dressed.  Do not have throw rugs and other things on the floor that can make you trip. What can I do in the kitchen?  Clean up any spills right away.  Avoid walking on wet floors.  Keep items that you use a lot in easy-to-reach places.  If you need to reach something above you, use a strong step stool that has a grab bar.  Keep electrical cords out of the way.  Do not use floor polish or wax that makes floors slippery. If you must use wax, use non-skid floor wax.  Do not have throw rugs and other things on the floor that can make you trip. What can I do with my stairs?  Do not leave any items on the stairs.  Make sure that there are  handrails on both sides of the stairs and use them. Fix handrails that are broken or loose. Make sure that handrails are as long as the stairways.  Check any carpeting to make sure that it is firmly attached to the stairs. Fix any carpet that is loose or worn.  Avoid having throw rugs at the top or bottom of the stairs. If you do have throw rugs, attach them to the floor with carpet tape.  Make sure that you have a light switch at the top of the stairs and the bottom of the stairs. If you do not have them, ask someone to add them for you. What else can I do to help prevent falls?  Wear shoes that:  Do not have high heels.  Have rubber bottoms.  Are comfortable and fit you well.  Are closed at the toe. Do not wear sandals.  If you use a stepladder:  Make sure that it is fully opened. Do not climb a closed stepladder.  Make sure that both sides of the stepladder are locked into place.  Ask  someone to hold it for you, if possible.  Clearly mark and make sure that you can see:  Any grab bars or handrails.  First and last steps.  Where the edge of each step is.  Use tools that help you move around (mobility aids) if they are needed. These include:  Canes.  Walkers.  Scooters.  Crutches.  Turn on the lights when you go into a dark area. Replace any light bulbs as soon as they burn out.  Set up your furniture so you have a clear path. Avoid moving your furniture around.  If any of your floors are uneven, fix them.  If there are any pets around you, be aware of where they are.  Review your medicines with your doctor. Some medicines can make you feel dizzy. This can increase your chance of falling. Ask your doctor what other things that you can do to help prevent falls. This information is not intended to replace advice given to you by your health care provider. Make sure you discuss any questions you have with your health care provider. Document Released: 07/26/2009 Document Revised: 03/06/2016 Document Reviewed: 11/03/2014 Elsevier Interactive Patient Education  2017 [employer].

## 2020-09-26 ENCOUNTER — Other Ambulatory Visit: Payer: Self-pay | Admitting: Physician Assistant

## 2020-09-26 NOTE — Telephone Encounter (Signed)
LFD 08/24/20 #45 with 0 refills LOV 07/10/20 NOV none

## 2020-10-26 ENCOUNTER — Other Ambulatory Visit: Payer: Self-pay | Admitting: Physician Assistant

## 2020-10-26 NOTE — Telephone Encounter (Signed)
Xanax last rx 09/27/20 #45 LOV: 07/10/20 CSC: 05/25/18

## 2020-11-28 ENCOUNTER — Other Ambulatory Visit: Payer: Self-pay | Admitting: Physician Assistant

## 2020-11-30 NOTE — Telephone Encounter (Signed)
Xanax last rx 10/27/20 #45 LOV: 07/10/20 HTN and ED CSC: 05/25/18

## 2020-12-18 ENCOUNTER — Other Ambulatory Visit: Payer: Self-pay | Admitting: Family

## 2020-12-18 ENCOUNTER — Other Ambulatory Visit: Payer: Self-pay | Admitting: Physician Assistant

## 2020-12-24 NOTE — Telephone Encounter (Signed)
Requesting:Xanax 1mg  Contract: UDS: Last Visit:06/20/20 Next Visit:n/a Last Refill:11/30/20 45 tabs 0 refills  Please Advise

## 2020-12-24 NOTE — Telephone Encounter (Signed)
Patient called back about this request.  He said he is not out yet but will be soon.  Please advise.

## 2021-01-24 ENCOUNTER — Other Ambulatory Visit: Payer: Self-pay

## 2021-01-24 ENCOUNTER — Telehealth: Payer: Self-pay

## 2021-01-24 ENCOUNTER — Telehealth: Payer: Self-pay | Admitting: Physician Assistant

## 2021-01-24 DIAGNOSIS — R739 Hyperglycemia, unspecified: Secondary | ICD-10-CM

## 2021-01-24 DIAGNOSIS — I10 Essential (primary) hypertension: Secondary | ICD-10-CM

## 2021-01-24 DIAGNOSIS — E785 Hyperlipidemia, unspecified: Secondary | ICD-10-CM

## 2021-01-24 MED ORDER — POTASSIUM CHLORIDE CRYS ER 20 MEQ PO TBCR: EXTENDED_RELEASE_TABLET | ORAL | 0 refills | Status: DC

## 2021-01-24 MED ORDER — ATORVASTATIN CALCIUM 10 MG PO TABS: 1.0000 | ORAL_TABLET | Freq: Every day | ORAL | 0 refills | Status: DC

## 2021-01-24 MED ORDER — METOPROLOL SUCCINATE ER 25 MG PO TB24: 1.0000 | ORAL_TABLET | Freq: Every day | ORAL | 0 refills | Status: DC

## 2021-01-24 NOTE — Telephone Encounter (Signed)
..   LAST APPOINTMENT DATE: 12/18/2020   NEXT APPOINTMENT DATE:02/12/2021  MEDICATION:    PHARMACY:  Let patient know to contact pharmacy at the end of the day to make sure medication is ready.  Please notify patient to allow 48-72 hours to process  Encourage patient to contact the pharmacy for refills or they can request refills through [place]:   LAST REFILL:  QTY:  REFILL DATE:    OTHER COMMENTS:   Patient to call back with refill request.    Okay for refill?  Please advise

## 2021-01-24 NOTE — Telephone Encounter (Signed)
Klor-con-M20, atorvastin, metoprololsuccer, Aprazolam, - [facility] in [place]

## 2021-01-24 NOTE — Telephone Encounter (Signed)
Requesting:Xanax 1mg  Contract: UDS: Last Visit: Next Visit:02/12/21 TOC Maximiano Coss Last Refill:12/26/20 45 tabs 0 refills  Please Advise

## 2021-01-24 NOTE — Telephone Encounter (Signed)
Rx has been sent to patient pharmacy. Patient Xanax Rx has been sent to [facility] for approval

## 2021-01-27 MED ORDER — ALPRAZOLAM 1 MG PO TABS: ORAL_TABLET | ORAL | 0 refills | Status: DC

## 2021-01-27 NOTE — Addendum Note (Signed)
Addended by: Dutch Quint B on: 01/27/2021 08:26 AM   Modules accepted: Orders

## 2021-02-11 NOTE — Telephone Encounter (Signed)
Encounter started in error.

## 2021-02-12 ENCOUNTER — Ambulatory Visit (INDEPENDENT_AMBULATORY_CARE_PROVIDER_SITE_OTHER): Payer: Medicare Other | Admitting: Registered Nurse

## 2021-02-12 ENCOUNTER — Encounter: Payer: Self-pay | Admitting: Registered Nurse

## 2021-02-12 ENCOUNTER — Other Ambulatory Visit: Payer: Self-pay

## 2021-02-12 VITALS — BP 149/74 | HR 79 | Temp 98.0°F | Resp 17 | Ht 75.0 in | Wt 273.6 lb

## 2021-02-12 DIAGNOSIS — Z13228 Encounter for screening for other metabolic disorders: Secondary | ICD-10-CM

## 2021-02-12 DIAGNOSIS — H938X3 Other specified disorders of ear, bilateral: Secondary | ICD-10-CM

## 2021-02-12 DIAGNOSIS — Z7689 Persons encountering health services in other specified circumstances: Secondary | ICD-10-CM

## 2021-02-12 DIAGNOSIS — Z130 Encounter for screening for diseases of the blood and blood-forming organs and certain disorders involving the immune mechanism: Secondary | ICD-10-CM

## 2021-02-12 DIAGNOSIS — F419 Anxiety disorder, unspecified: Secondary | ICD-10-CM

## 2021-02-12 DIAGNOSIS — Z1329 Encounter for screening for other suspected endocrine disorder: Secondary | ICD-10-CM | POA: Diagnosis not present

## 2021-02-12 DIAGNOSIS — Z13220 Encounter for screening for lipoid disorders: Secondary | ICD-10-CM

## 2021-02-12 MED ORDER — ALPRAZOLAM 1 MG PO TABS: ORAL_TABLET | ORAL | 2 refills | Status: DC

## 2021-02-12 MED ORDER — HYDROCORTISONE-ACETIC ACID 1-2 % OT SOLN: 3.0000 [drp] | Freq: Three times a day (TID) | OTIC | 3 refills | Status: DC

## 2021-02-12 NOTE — Patient Instructions (Signed)
° ° ° °  If you have lab work done today you will be contacted with your lab results within the next 2 weeks.  If you have not heard from us then please contact us. The fastest way to get your results is to register for My Chart. ° ° °IF you received an x-ray today, you will receive an invoice from [facility]. Please contact  Radiology at [phone] with questions or concerns regarding your invoice.  ° °IF you received labwork today, you will receive an invoice from [facility]. Please contact [facility] at [phone] with questions or concerns regarding your invoice.  ° °Our billing staff will not be able to assist you with questions regarding bills from these companies. ° °You will be contacted with the lab results as soon as they are available. The fastest way to get your results is to activate your My Chart account. Instructions are located on the last page of this paperwork. If you have not heard from us regarding the results in 2 weeks, please contact this office. °  ° ° ° °

## 2021-02-12 NOTE — Progress Notes (Signed)
Established Patient Office Visit  Subjective:  Patient ID: Wayne Rhodes, male    DOB: 03/02/1956  Age: 65 y.o. MRN: [id number]  CC:  Chief Complaint  Patient presents with  . Transitions Of Care    Patient states he is for a TOC. Patient would also like to discuss feeling like he has had fluid in his ears for about 4-5 months.    HPI Wayne Rhodes presents for visit to est care  Histories reviewed and updated with patient.  C/o fluid in ears today Feeling consistently for 5-6 mo No acute pain, drainage, or other concerns.  No other URI symptoms Hx of seasonal allergies but never like this.   Otherwise feeling well.   Past Medical History:  Diagnosis Date  . Anxiety    takes xanax for anxiety  . Arthritis   . Cancer ([place])    melanoma on nose   . GERD (gastroesophageal reflux disease)   . Hyperlipidemia   . Hypertension   . Pneumonia    had pneumonia 4-5 months ago  . Recurrent upper respiratory infection (URI)    recent chest cold - treated with mucinex and cough medicine - now improved    Past Surgical History:  Procedure Laterality Date  . ANTERIOR CERVICAL DECOMP/DISCECTOMY FUSION  09/25/2011   Procedure: ANTERIOR CERVICAL DECOMPRESSION/DISCECTOMY FUSION 1 LEVEL;  Surgeon: Dahlia Bailiff;  Location: [facility];  Service: Orthopedics;  Laterality: N/A;  ACDF Cervical 5-6  . COLONOSCOPY    . ELBOW SURGERY     both elbow surgery  . KNEE ARTHROSCOPY     left    Family History  Problem Relation Age of Onset  . Prostate cancer Father        prostate  . Bladder Cancer Father   . Prostate cancer Brother   . Colon cancer Neg Hx   . Colon polyps Neg Hx   . Esophageal cancer Neg Hx   . Rectal cancer Neg Hx   . Stomach cancer Neg Hx     Social History   Socioeconomic History  . Marital status: Single    Spouse name: Not on file  . Number of children: 2  . Years of education: Not on file  . Highest education level: Not on file  Occupational History  .  Occupation: Retired    Comment: Corporate treasurer  Tobacco Use  . Smoking status: Never  . Smokeless tobacco: Never  Vaping Use  . Vaping Use: Never used  Substance and Sexual Activity  . Alcohol use: Yes    Alcohol/week: 1.0 standard drink    Types: 1 Cans of beer per week    Comment: occasional beer  . Drug use: No  . Sexual activity: Yes  Other Topics Concern  . Not on file  Social History Narrative  . Not on file   Social Determinants of Health   Financial Resource Strain: Low Risk   . Difficulty of Paying Living Expenses: Not hard at all  Food Insecurity: No Food Insecurity  . Worried About Charity fundraiser in the Last Year: Never true  . Ran Out of Food in the Last Year: Never true  Transportation Needs: No Transportation Needs  . Lack of Transportation (Medical): No  . Lack of Transportation (Non-Medical): No  Physical Activity: Insufficiently Active  . Days of Exercise per Week: 2 days  . Minutes of Exercise per Session: 20 min  Stress: No Stress Concern Present  . Feeling of Stress :  Not at all  Social Connections: Socially Isolated  . Frequency of Communication with Friends and Family: More than three times a week  . Frequency of Social Gatherings with Friends and Family: More than three times a week  . Attends Religious Services: Never  . Active Member of Clubs or Organizations: No  . Attends Archivist Meetings: Never  . Marital Status: Never married  Intimate Partner Violence: Not At Risk  . Fear of Current or Ex-Partner: No  . Emotionally Abused: No  . Physically Abused: No  . Sexually Abused: No    Outpatient Medications Prior to Visit  Medication Sig Dispense Refill  . EPINEPHrine (EPI-PEN) 0.3 mg/0.3 mL DEVI Inject 0.3 mg into the muscle as needed. For anaphylaxis    . oxyCODONE (ROXICODONE) 15 MG immediate release tablet Take 15 mg by mouth every 6 (six) hours as needed (Dr. Nelva Bush).     . sildenafil (REVATIO) 20 MG tablet Take 1-2  tablets PO 30 minutes before desired effect. No more than 1 dose in 36 hours. 30 tablet 0  . triamcinolone ointment (KENALOG) 0.5 % APPLY TO AFFECTED AREA TWICE A DAY 30 g 0  . ALPRAZolam (XANAX) 1 MG tablet TAKE 1/2 TABLET BY MOUTH 3 TIMES A DAY AS NEEDED FOR ANXIETY 45 tablet 0  . atorvastatin (LIPITOR) 10 MG tablet Take 1 tablet (10 mg total) by mouth daily. 90 tablet 0  . metoprolol succinate (TOPROL-XL) 25 MG 24 hr tablet Take 1 tablet (25 mg total) by mouth daily. 90 tablet 0  . potassium chloride SA (KLOR-CON M20) 20 MEQ tablet TAKE 1 TABLET BY MOUTH EVERY DAY 90 tablet 0  . L-Methylfolate 15 MG TABS Take 1 tablet by mouth daily. (Patient not taking: No sig reported)     No facility-administered medications prior to visit.    Allergies  Allergen Reactions  . Clonazepam     Angioedema   . Quinapril     Angioedema   . Sertraline Hcl Other (See Comments)    Suicidal thoughts    ROS Review of Systems  Constitutional: Negative.   HENT: Negative.    Eyes: Negative.   Respiratory: Negative.    Cardiovascular: Negative.   Gastrointestinal: Negative.   Genitourinary: Negative.   Musculoskeletal: Negative.   Skin: Negative.   Neurological: Negative.   Psychiatric/Behavioral: Negative.    All other systems reviewed and are negative.    Objective:    Physical Exam Constitutional:      General: He is not in acute distress.    Appearance: Normal appearance. He is normal weight. He is not ill-appearing, toxic-appearing or diaphoretic.  HENT:     Right Ear: Hearing, tympanic membrane, ear canal and external ear normal. There is no impacted cerumen.     Left Ear: Hearing, tympanic membrane, ear canal and external ear normal. There is no impacted cerumen.  Cardiovascular:     Rate and Rhythm: Normal rate and regular rhythm.     Heart sounds: Normal heart sounds. No murmur heard.   No friction rub. No gallop.  Pulmonary:     Effort: Pulmonary effort is normal. No respiratory  distress.     Breath sounds: Normal breath sounds. No stridor. No wheezing, rhonchi or rales.  Chest:     Chest wall: No tenderness.  Neurological:     General: No focal deficit present.     Mental Status: He is alert and oriented to person, place, and time. Mental status is at baseline.  Psychiatric:        Mood and Affect: Mood normal.        Behavior: Behavior normal.        Thought Content: Thought content normal.        Judgment: Judgment normal.    BP (!) 149/74   Pulse 79   Temp 98 F (36.7 C) (Temporal)   Resp 17   Ht 6\' 3"  (1.905 m)   Wt 273 lb 9.6 oz (124.1 kg)   SpO2 98%   BMI 34.20 kg/m  Wt Readings from Last 3 Encounters:  04/01/21 266 lb (120.7 kg)  02/12/21 273 lb 9.6 oz (124.1 kg)  09/03/20 255 lb (115.7 kg)     Health Maintenance Due  Topic Date Due  . COVID-19 Vaccine (1) Never done  . Pneumonia Vaccine 52+ Years old (1 - PCV) Never done  . Zoster Vaccines- Shingrix (1 of 2) Never done    There are no preventive care reminders to display for this patient.  Lab Results  Component Value Date   TSH 2.376 09/16/2019   Lab Results  Component Value Date   WBC 5.8 09/16/2019   HGB 18.1 (H) 09/16/2019   HCT 51.7 09/16/2019   MCV 90.4 09/16/2019   PLT 229 09/16/2019   Lab Results  Component Value Date   NA 136 02/27/2020   K 3.7 02/27/2020   CO2 29 02/27/2020   GLUCOSE 155 (H) 02/27/2020   BUN 20 02/27/2020   CREATININE 0.87 02/27/2020   BILITOT 0.8 04/06/2020   ALKPHOS 49 04/06/2020   AST 14 04/06/2020   ALT 15 04/06/2020   PROT 6.1 04/06/2020   ALBUMIN 3.4 (L) 04/06/2020   CALCIUM 8.4 02/27/2020   ANIONGAP 12 09/16/2019   GFR 87.79 02/27/2020   Lab Results  Component Value Date   CHOL 129 04/06/2020   Lab Results  Component Value Date   HDL 24.60 (L) 04/06/2020   Lab Results  Component Value Date   LDLCALC 82 04/06/2020   Lab Results  Component Value Date   TRIG 114.0 04/06/2020   Lab Results  Component Value Date    CHOLHDL 5 04/06/2020   Lab Results  Component Value Date   HGBA1C 6.2 08/31/2019      Assessment & Plan:   Problem List Items Addressed This Visit       Other   Anxiety   Other Visit Diagnoses     Encounter to establish care    -  Primary   Pressure sensation in both ears       Relevant Medications   acetic acid-hydrocortisone (VOSOL-HC) OTIC solution   Screening for endocrine, metabolic and immunity disorder       Lipid screening           Meds ordered this encounter  Medications  . DISCONTD: ALPRAZolam (XANAX) 1 MG tablet    Sig: Take 1/2 to 1 tablet by mouth two times daily as needed for anxiety.    Dispense:  60 tablet    Refill:  2    Order Specific Question:   Supervising Provider    Answer:   Carlota Raspberry, JEFFREY R [[id number]]  . acetic acid-hydrocortisone (VOSOL-HC) OTIC solution    Sig: Place 3 drops into both ears 3 (three) times daily.    Dispense:  10 mL    Refill:  3    Order Specific Question:   Supervising Provider    Answer:   Carlota Raspberry, JEFFREY R [[id number]]  Follow-up: No follow-ups on file.   PLAN Vosol-hc for pressure in ears. No apparent blockage. Can consider ENT referral if worsening or persistent. Refill alprazolam. Reviewed risks, benefits, and AE of benzodiazepines. Pt voices understanding. Return for labs and follow up within 6 mo Patient encouraged to call clinic with any questions, comments, or concerns.  Maximiano Coss, NP

## 2021-04-01 ENCOUNTER — Ambulatory Visit (INDEPENDENT_AMBULATORY_CARE_PROVIDER_SITE_OTHER): Payer: Medicare Other | Admitting: Registered Nurse

## 2021-04-01 ENCOUNTER — Other Ambulatory Visit: Payer: Self-pay

## 2021-04-01 ENCOUNTER — Encounter: Payer: Self-pay | Admitting: Registered Nurse

## 2021-04-01 VITALS — BP 149/84 | HR 70 | Temp 98.3°F | Resp 18 | Ht 75.0 in | Wt 266.0 lb

## 2021-04-01 DIAGNOSIS — R0781 Pleurodynia: Secondary | ICD-10-CM | POA: Diagnosis not present

## 2021-04-01 MED ORDER — METHOCARBAMOL 500 MG PO TABS: 500.0000 mg | ORAL_TABLET | Freq: Three times a day (TID) | ORAL | 0 refills | Status: DC | PRN

## 2021-04-01 NOTE — Patient Instructions (Addendum)
[name] to see you  I think there's a musculoskeletal issue ongoing. I sincerely doubt a cardiac concern - though if this does develop, head to the emergency room  Suggest methocarbamol (robaxin) 500mg  up to three times daily as needed for pain  Continue to stretch with range of motion exercises through the shoulder.  Can also stretch pectoral muscle by walking through a door but catching your hands in push up position on the door frame - almost like you're leaning into a room without your feet going in.  Let me know if pain persists. Can consider chest xray to rule out broken rib.  Thank you  [name]     If you have lab work done today you will be contacted with your lab results within the next 2 weeks.  If you have not heard from [place] then please contact us. The fastest way to get your results is to register for My Chart.   IF you received an x-ray today, you will receive an invoice from [facility]. Please contact [facility] at [phone] with questions or concerns regarding your invoice.   IF you received labwork today, you will receive an invoice from [facility]. Please contact [facility] at [phone] with questions or concerns regarding your invoice.   Our billing staff will not be able to assist you with questions regarding bills from these companies.  You will be contacted with the lab results as soon as they are available. The fastest way to get your results is to activate your My Chart account. Instructions are located on the last page of this paperwork. If you have not heard from [place] regarding the results in 2 weeks, please contact this office.

## 2021-04-01 NOTE — Progress Notes (Signed)
Established Patient Office Visit  Subjective:  Patient ID: Wayne Rhodes, male    DOB: January 07, 1956  Age: 65 y.o. MRN: [id number]  CC:  Chief Complaint  Patient presents with   Nasal Congestion    Patient states he has had some head and chest congestion for 3-4 weeks then it finally cleared up but still feels like he pulled a muscle in his chest .   Osteoarthritis    HPI Wayne Rhodes presents for chest pain  Notes recent respiratory infection. Congestion, coughing, and sneezing x 3-4 weeks Notes that he had acute pain after a particularly severe day Notes that is is easily reproducible with ROM.  Not reproducible on exertion, no lightheadedness, dizziness, shob, doe, palpitations  Otherwise no concerns. Notes recent lower back injection from Dr. Nelva Bush for bulging discs. Good effect.     Past Medical History:  Diagnosis Date   Anxiety    takes xanax for anxiety   Arthritis    Cancer ([place])    melanoma on nose    GERD (gastroesophageal reflux disease)    Hyperlipidemia    Hypertension    Pneumonia    had pneumonia 4-5 months ago   Recurrent upper respiratory infection (URI)    recent chest cold - treated with mucinex and cough medicine - now improved    Past Surgical History:  Procedure Laterality Date   ANTERIOR CERVICAL DECOMP/DISCECTOMY FUSION  09/25/2011   Procedure: ANTERIOR CERVICAL DECOMPRESSION/DISCECTOMY FUSION 1 LEVEL;  Surgeon: Dahlia Bailiff;  Location: [facility];  Service: Orthopedics;  Laterality: N/A;  ACDF Cervical 5-6   COLONOSCOPY     ELBOW SURGERY     both elbow surgery   KNEE ARTHROSCOPY     left    Family History  Problem Relation Age of Onset   Prostate cancer Father        prostate   Bladder Cancer Father    Prostate cancer Brother    Colon cancer Neg Hx    Colon polyps Neg Hx    Esophageal cancer Neg Hx    Rectal cancer Neg Hx    Stomach cancer Neg Hx     Social History   Socioeconomic History   Marital status: Single     Spouse name: Not on file   Number of children: 2   Years of education: Not on file   Highest education level: Not on file  Occupational History   Occupation: Retired    Comment: Service Department  Tobacco Use   Smoking status: Never   Smokeless tobacco: Never  Vaping Use   Vaping Use: Never used  Substance and Sexual Activity   Alcohol use: Yes    Alcohol/week: 1.0 standard drink    Types: 1 Cans of beer per week    Comment: occasional beer   Drug use: No   Sexual activity: Yes  Other Topics Concern   Not on file  Social History Narrative   Not on file   Social Determinants of Health   Financial Resource Strain: Low Risk    Difficulty of Paying Living Expenses: Not hard at all  Food Insecurity: No Food Insecurity   Worried About Charity fundraiser in the Last Year: Never true   [place] in the Last Year: Never true  Transportation Needs: No Transportation Needs   Lack of Transportation (Medical): No   Lack of Transportation (Non-Medical): No  Physical Activity: Insufficiently Active   Days of Exercise per Week:  2 days   Minutes of Exercise per Session: 20 min  Stress: No Stress Concern Present   Feeling of Stress : Not at all  Social Connections: Socially Isolated   Frequency of Communication with Friends and Family: More than three times a week   Frequency of Social Gatherings with Friends and Family: More than three times a week   Attends Religious Services: Never   Marine scientist or Organizations: No   Attends Music therapist: Never   Marital Status: Never married  Human resources officer Violence: Not At Risk   Fear of Current or Ex-Partner: No   Emotionally Abused: No   Physically Abused: No   Sexually Abused: No    Outpatient Medications Prior to Visit  Medication Sig Dispense Refill   acetic acid-hydrocortisone (VOSOL-HC) OTIC solution Place 3 drops into both ears 3 (three) times daily. 10 mL 3   ALPRAZolam (XANAX) 1 MG tablet  Take 1/2 to 1 tablet by mouth two times daily as needed for anxiety. 60 tablet 2   atorvastatin (LIPITOR) 10 MG tablet Take 1 tablet (10 mg total) by mouth daily. 90 tablet 0   EPINEPHrine (EPI-PEN) 0.3 mg/0.3 mL DEVI Inject 0.3 mg into the muscle as needed. For anaphylaxis     metoprolol succinate (TOPROL-XL) 25 MG 24 hr tablet Take 1 tablet (25 mg total) by mouth daily. 90 tablet 0   oxyCODONE (ROXICODONE) 15 MG immediate release tablet Take 15 mg by mouth every 6 (six) hours as needed (Dr. Nelva Bush).      potassium chloride SA (KLOR-CON M20) 20 MEQ tablet TAKE 1 TABLET BY MOUTH EVERY DAY 90 tablet 0   sildenafil (REVATIO) 20 MG tablet Take 1-2 tablets PO 30 minutes before desired effect. No more than 1 dose in 36 hours. 30 tablet 0   triamcinolone ointment (KENALOG) 0.5 % APPLY TO AFFECTED AREA TWICE A DAY 30 g 0   L-Methylfolate 15 MG TABS Take 1 tablet by mouth daily. (Patient not taking: No sig reported)     No facility-administered medications prior to visit.    Allergies  Allergen Reactions   Clonazepam     Angioedema    Quinapril     Angioedema    Sertraline Hcl Other (See Comments)    Suicidal thoughts    ROS Review of Systems  Constitutional: Negative.   HENT: Negative.    Eyes: Negative.   Respiratory: Negative.    Cardiovascular: Negative.   Gastrointestinal: Negative.   Genitourinary: Negative.   Musculoskeletal: Negative.   Skin: Negative.   Neurological: Negative.   Psychiatric/Behavioral: Negative.    All other systems reviewed and are negative.    Objective:    Physical Exam Constitutional:      General: He is not in acute distress.    Appearance: Normal appearance. He is normal weight. He is not ill-appearing, toxic-appearing or diaphoretic.  Cardiovascular:     Rate and Rhythm: Normal rate and regular rhythm.     Heart sounds: Normal heart sounds. No murmur heard.   No friction rub. No gallop.  Pulmonary:     Effort: Pulmonary effort is normal. No  respiratory distress.     Breath sounds: Normal breath sounds. No stridor. No wheezing, rhonchi or rales.  Chest:     Chest wall: No tenderness.  Musculoskeletal:        General: Tenderness (L side chest, 6th intercostal space) and signs of injury present. No swelling or deformity. Normal range of motion.  Right lower leg: No edema.     Left lower leg: No edema.  Neurological:     General: No focal deficit present.     Mental Status: He is alert and oriented to person, place, and time. Mental status is at baseline.  Psychiatric:        Mood and Affect: Mood normal.        Behavior: Behavior normal.        Thought Content: Thought content normal.        Judgment: Judgment normal.    BP (!) 149/84   Pulse 70   Temp 98.3 F (36.8 C) (Temporal)   Resp 18   Ht 6\' 3"  (1.905 m)   Wt 266 lb (120.7 kg)   SpO2 99%   BMI 33.25 kg/m  Wt Readings from Last 3 Encounters:  04/01/21 266 lb (120.7 kg)  02/12/21 273 lb 9.6 oz (124.1 kg)  09/03/20 255 lb (115.7 kg)     There are no preventive care reminders to display for this patient.  There are no preventive care reminders to display for this patient.  Lab Results  Component Value Date   TSH 2.376 09/16/2019   Lab Results  Component Value Date   WBC 5.8 09/16/2019   HGB 18.1 (H) 09/16/2019   HCT 51.7 09/16/2019   MCV 90.4 09/16/2019   PLT 229 09/16/2019   Lab Results  Component Value Date   NA 136 02/27/2020   K 3.7 02/27/2020   CO2 29 02/27/2020   GLUCOSE 155 (H) 02/27/2020   BUN 20 02/27/2020   CREATININE 0.87 02/27/2020   BILITOT 0.8 04/06/2020   ALKPHOS 49 04/06/2020   AST 14 04/06/2020   ALT 15 04/06/2020   PROT 6.1 04/06/2020   ALBUMIN 3.4 (L) 04/06/2020   CALCIUM 8.4 02/27/2020   ANIONGAP 12 09/16/2019   GFR 87.79 02/27/2020   Lab Results  Component Value Date   CHOL 129 04/06/2020   Lab Results  Component Value Date   HDL 24.60 (L) 04/06/2020   Lab Results  Component Value Date   LDLCALC 82  04/06/2020   Lab Results  Component Value Date   TRIG 114.0 04/06/2020   Lab Results  Component Value Date   CHOLHDL 5 04/06/2020   Lab Results  Component Value Date   HGBA1C 6.2 08/31/2019      Assessment & Plan:   Problem List Items Addressed This Visit   None Visit Diagnoses     Rib pain    -  Primary   Relevant Medications   methocarbamol (ROBAXIN) 500 MG tablet       No orders of the defined types were placed in this encounter.   Follow-up: No follow-ups on file.   PLAN Methocarbamol tid prn for rib pain Return if worsening or failing to improve Suspect msk. Very low suspicion for cardiac etiology, but reviewed ER precautions Patient encouraged to call clinic with any questions, comments, or concerns.  Maximiano Coss, NP

## 2021-04-22 ENCOUNTER — Other Ambulatory Visit: Payer: Self-pay | Admitting: Family Medicine

## 2021-04-22 DIAGNOSIS — R739 Hyperglycemia, unspecified: Secondary | ICD-10-CM

## 2021-04-22 DIAGNOSIS — I10 Essential (primary) hypertension: Secondary | ICD-10-CM

## 2021-05-02 ENCOUNTER — Other Ambulatory Visit: Payer: Self-pay | Admitting: Registered Nurse

## 2021-05-02 DIAGNOSIS — R0781 Pleurodynia: Secondary | ICD-10-CM

## 2021-05-19 ENCOUNTER — Other Ambulatory Visit: Payer: Self-pay | Admitting: Registered Nurse

## 2021-05-19 DIAGNOSIS — R0781 Pleurodynia: Secondary | ICD-10-CM

## 2021-05-20 NOTE — Telephone Encounter (Signed)
LFD 05/02/21 #60 with no refills LOV 04/01/21 NOV none

## 2021-06-04 ENCOUNTER — Other Ambulatory Visit: Payer: Self-pay | Admitting: Registered Nurse

## 2021-06-04 DIAGNOSIS — F419 Anxiety disorder, unspecified: Secondary | ICD-10-CM

## 2021-06-29 ENCOUNTER — Other Ambulatory Visit: Payer: Self-pay | Admitting: Registered Nurse

## 2021-06-29 ENCOUNTER — Other Ambulatory Visit: Payer: Self-pay | Admitting: Family Medicine

## 2021-06-29 DIAGNOSIS — E785 Hyperlipidemia, unspecified: Secondary | ICD-10-CM

## 2021-06-29 DIAGNOSIS — I10 Essential (primary) hypertension: Secondary | ICD-10-CM

## 2021-07-18 ENCOUNTER — Other Ambulatory Visit: Payer: Self-pay | Admitting: Registered Nurse

## 2021-07-18 DIAGNOSIS — R739 Hyperglycemia, unspecified: Secondary | ICD-10-CM

## 2021-09-09 ENCOUNTER — Other Ambulatory Visit: Payer: Self-pay | Admitting: Registered Nurse

## 2021-09-09 DIAGNOSIS — F419 Anxiety disorder, unspecified: Secondary | ICD-10-CM

## 2021-09-25 ENCOUNTER — Other Ambulatory Visit: Payer: Self-pay | Admitting: Registered Nurse

## 2021-09-25 DIAGNOSIS — I10 Essential (primary) hypertension: Secondary | ICD-10-CM

## 2021-09-30 ENCOUNTER — Telehealth: Payer: Self-pay

## 2021-09-30 ENCOUNTER — Other Ambulatory Visit: Payer: Self-pay | Admitting: Registered Nurse

## 2021-09-30 DIAGNOSIS — E785 Hyperlipidemia, unspecified: Secondary | ICD-10-CM

## 2021-09-30 NOTE — Telephone Encounter (Signed)
Caller name:Keelin Sho Salguero callback [phone]  Encourage patient to contact the pharmacy for refills or they can request refills through [facility]  (Please schedule appointment if patient has not been seen in over a year)  MEDICATION NAME & DOSE:metoprolol succinate (TOPROL-XL) 25 MG 24 hr tablet   Notes/Comments from patient:Pharmacy needs a diagnose CODE  WHAT [place] TO: [id number] - [place], [place] - [address]   Please notify patient: It takes 48-72 hours to process rx refill requests Ask patient to call pharmacy to ensure rx is ready before heading there.   (CLINICAL TO FILL OR ROUTE PER PROTOCOLS)

## 2021-09-30 NOTE — Telephone Encounter (Signed)
Patient aware that medication is ready for him to pick up at the pharmacy

## 2021-10-03 ENCOUNTER — Other Ambulatory Visit: Payer: Self-pay | Admitting: Registered Nurse

## 2021-10-03 DIAGNOSIS — E785 Hyperlipidemia, unspecified: Secondary | ICD-10-CM

## 2021-10-03 DIAGNOSIS — I10 Essential (primary) hypertension: Secondary | ICD-10-CM

## 2021-10-10 ENCOUNTER — Ambulatory Visit (INDEPENDENT_AMBULATORY_CARE_PROVIDER_SITE_OTHER): Payer: Medicare Other

## 2021-10-10 DIAGNOSIS — Z0000 Encounter for general adult medical examination without abnormal findings: Secondary | ICD-10-CM | POA: Diagnosis not present

## 2021-10-10 NOTE — Patient Instructions (Signed)
[name] , Thank you for taking time to come for your Medicare Wellness Visit. I appreciate your ongoing commitment to your health goals. Please review the following plan we discussed and let me know if I can assist you in the future.   Screening recommendations/referrals: Colonoscopy: 06/03/2019  due 2023 Recommended yearly ophthalmology/optometry visit for glaucoma screening and checkup Recommended yearly dental visit for hygiene and checkup  Vaccinations: Influenza vaccine: Declined  Pneumococcal vaccine: Declined  Tdap vaccine: Declined  Shingles vaccine: Declined     Advanced directives: none   Conditions/risks identified: none   Next appointment: 10/17/2021  150pm  Wayne Rhodes ,NP  Preventive Care 67 Years and Older, Male Preventive care refers to lifestyle choices and visits with your health care provider that can promote health and wellness. What does preventive care include? A yearly physical exam. This is also called an annual well check. Dental exams once or twice a year. Routine eye exams. Ask your health care provider how often you should have your eyes checked. Personal lifestyle choices, including: Daily care of your teeth and gums. Regular physical activity. Eating a healthy diet. Avoiding tobacco and drug use. Limiting alcohol use. Practicing safe sex. Taking low doses of aspirin every day. Taking vitamin and mineral supplements as recommended by your health care provider. What happens during an annual well check? The services and screenings done by your health care provider during your annual well check will depend on your age, overall health, lifestyle risk factors, and family history of disease. Counseling  Your health care provider may ask you questions about your: Alcohol use. Tobacco use. Drug use. Emotional well-being. Home and relationship well-being. Sexual activity. Eating habits. History of falls. Memory and ability to understand  (cognition). Work and work Statistician. Screening  You may have the following tests or measurements: Height, weight, and BMI. Blood pressure. Lipid and cholesterol levels. These may be checked every 5 years, or more frequently if you are over 63 years old. Skin check. Lung cancer screening. You may have this screening every year starting at age 35 if you have a 30-pack-year history of smoking and currently smoke or have quit within the past 15 years. Fecal occult blood test (FOBT) of the stool. You may have this test every year starting at age 48. Flexible sigmoidoscopy or colonoscopy. You may have a sigmoidoscopy every 5 years or a colonoscopy every 10 years starting at age 73. Prostate cancer screening. Recommendations will vary depending on your family history and other risks. Hepatitis C blood test. Hepatitis B blood test. Sexually transmitted disease (STD) testing. Diabetes screening. This is done by checking your blood sugar (glucose) after you have not eaten for a while (fasting). You may have this done every 1-3 years. Abdominal aortic aneurysm (AAA) screening. You may need this if you are a current or former smoker. Osteoporosis. You may be screened starting at age 91 if you are at high risk. Talk with your health care provider about your test results, treatment options, and if necessary, the need for more tests. Vaccines  Your health care provider may recommend certain vaccines, such as: Influenza vaccine. This is recommended every year. Tetanus, diphtheria, and acellular pertussis (Tdap, Td) vaccine. You may need a Td booster every 10 years. Zoster vaccine. You may need this after age 61. Pneumococcal 13-valent conjugate (PCV13) vaccine. One dose is recommended after age 65. Pneumococcal polysaccharide (PPSV23) vaccine. One dose is recommended after age 60. Talk to your health care provider about which screenings  and vaccines you need and how often you need them. This  information is not intended to replace advice given to you by your health care provider. Make sure you discuss any questions you have with your health care provider. Document Released: 10/26/2015 Document Revised: 06/18/2016 Document Reviewed: 07/31/2015 Elsevier Interactive Patient Education  2017 [place] Prevention in the Home Falls can cause injuries. They can happen to people of all ages. There are many things you can do to make your home safe and to help prevent falls. What can I do on the outside of my home? Regularly fix the edges of walkways and driveways and fix any cracks. Remove anything that might make you trip as you walk through a door, such as a raised step or threshold. Trim any bushes or trees on the path to your home. Use bright outdoor lighting. Clear any walking paths of anything that might make someone trip, such as rocks or tools. Regularly check to see if handrails are loose or broken. Make sure that both sides of any steps have handrails. Any raised decks and porches should have guardrails on the edges. Have any leaves, snow, or ice cleared regularly. Use sand or salt on walking paths during winter. Clean up any spills in your garage right away. This includes oil or grease spills. What can I do in the bathroom? Use night lights. Install grab bars by the toilet and in the tub and shower. Do not use towel bars as grab bars. Use non-skid mats or decals in the tub or shower. If you need to sit down in the shower, use a plastic, non-slip stool. Keep the floor dry. Clean up any water that spills on the floor as soon as it happens. Remove soap buildup in the tub or shower regularly. Attach bath mats securely with double-sided non-slip rug tape. Do not have throw rugs and other things on the floor that can make you trip. What can I do in the bedroom? Use night lights. Make sure that you have a light by your bed that is easy to reach. Do not use any sheets or  blankets that are too big for your bed. They should not hang down onto the floor. Have a firm chair that has side arms. You can use this for support while you get dressed. Do not have throw rugs and other things on the floor that can make you trip. What can I do in the kitchen? Clean up any spills right away. Avoid walking on wet floors. Keep items that you use a lot in easy-to-reach places. If you need to reach something above you, use a strong step stool that has a grab bar. Keep electrical cords out of the way. Do not use floor polish or wax that makes floors slippery. If you must use wax, use non-skid floor wax. Do not have throw rugs and other things on the floor that can make you trip. What can I do with my stairs? Do not leave any items on the stairs. Make sure that there are handrails on both sides of the stairs and use them. Fix handrails that are broken or loose. Make sure that handrails are as long as the stairways. Check any carpeting to make sure that it is firmly attached to the stairs. Fix any carpet that is loose or worn. Avoid having throw rugs at the top or bottom of the stairs. If you do have throw rugs, attach them to the floor with carpet tape.  Make sure that you have a light switch at the top of the stairs and the bottom of the stairs. If you do not have them, ask someone to add them for you. What else can I do to help prevent falls? Wear shoes that: Do not have high heels. Have rubber bottoms. Are comfortable and fit you well. Are closed at the toe. Do not wear sandals. If you use a stepladder: Make sure that it is fully opened. Do not climb a closed stepladder. Make sure that both sides of the stepladder are locked into place. Ask someone to hold it for you, if possible. Clearly mark and make sure that you can see: Any grab bars or handrails. First and last steps. Where the edge of each step is. Use tools that help you move around (mobility aids) if they are  needed. These include: Canes. Walkers. Scooters. Crutches. Turn on the lights when you go into a dark area. Replace any light bulbs as soon as they burn out. Set up your furniture so you have a clear path. Avoid moving your furniture around. If any of your floors are uneven, fix them. If there are any pets around you, be aware of where they are. Review your medicines with your doctor. Some medicines can make you feel dizzy. This can increase your chance of falling. Ask your doctor what other things that you can do to help prevent falls. This information is not intended to replace advice given to you by your health care provider. Make sure you discuss any questions you have with your health care provider. Document Released: 07/26/2009 Document Revised: 03/06/2016 Document Reviewed: 11/03/2014 Elsevier Interactive Patient Education  2017 [employer].

## 2021-10-10 NOTE — Progress Notes (Signed)
Subjective:   Wayne Rhodes is a 65 y.o. male who presents for an Subsequent Medicare Annual Wellness Visit.  I connected with Donnella Sham today by telephone and verified that I am speaking with the correct person using two identifiers. Location patient: home Location provider: work Persons participating in the virtual visit: patient, provider.   I discussed the limitations, risks, security and privacy concerns of performing an evaluation and management service by telephone and the availability of in person appointments. I also discussed with the patient that there may be a patient responsible charge related to this service. The patient expressed understanding and verbally consented to this telephonic visit.    Interactive audio and video telecommunications were attempted between this provider and patient, however failed, due to patient having technical difficulties OR patient did not have access to video capability.  We continued and completed visit with audio only.    Review of Systems     Cardiac Risk Factors include: advanced age (>59men, >12 women);dyslipidemia;male gender     Objective:    Today's Vitals   There is no height or weight on file to calculate BMI.  Advanced Directives 10/10/2021 09/03/2020 08/31/2019 09/26/2011 09/25/2011 09/17/2011  Does Patient Have a Medical Advance Directive? No No No Patient would not like information Patient does not have advance directive;Patient would not like information Patient does not have advance directive  Would patient like information on creating a medical advance directive? No - Patient declined Yes (MAU/Ambulatory/Procedural Areas - Information given) Yes (MAU/Ambulatory/Procedural Areas - Information given) - - -  Pre-existing out of facility DNR order (yellow form or pink MOST form) - - - No No -    Current Medications (verified) Outpatient Encounter Medications as of 10/10/2021  Medication Sig   acetic  acid-hydrocortisone (VOSOL-HC) OTIC solution Place 3 drops into both ears 3 (three) times daily.   ALPRAZolam (XANAX) 1 MG tablet TAKE 1/2 TO 1 TABLET BY MOUTH TWO TIMES DAILY AS NEEDED FOR ANXIETY.   atorvastatin (LIPITOR) 10 MG tablet TAKE 1 TABLET BY MOUTH EVERY DAY   EPINEPHrine (EPI-PEN) 0.3 mg/0.3 mL DEVI Inject 0.3 mg into the muscle as needed. For anaphylaxis   L-Methylfolate 15 MG TABS Take 1 tablet by mouth daily.   methocarbamol (ROBAXIN) 500 MG tablet TAKE 1 TABLET BY MOUTH EVERY 8 HOURS AS NEEDED FOR MUSCLE SPASM   metoprolol succinate (TOPROL-XL) 25 MG 24 hr tablet TAKE 1 TABLET (25 MG TOTAL) BY MOUTH DAILY.   oxyCODONE (ROXICODONE) 15 MG immediate release tablet Take 15 mg by mouth every 6 (six) hours as needed (Dr. Nelva Bush).    potassium chloride SA (KLOR-CON M20) 20 MEQ tablet TAKE 1 TABLET BY MOUTH EVERY DAY   sildenafil (REVATIO) 20 MG tablet Take 1-2 tablets PO 30 minutes before desired effect. No more than 1 dose in 36 hours.   triamcinolone ointment (KENALOG) 0.5 % APPLY TO AFFECTED AREA TWICE A DAY   [DISCONTINUED] buPROPion (WELLBUTRIN XL) 150 MG 24 hr tablet Take 150 mg by mouth daily.   [DISCONTINUED] diphenhydrAMINE (BENADRYL) 25 MG tablet Take 25 mg by mouth 2 (two) times daily as needed. For swelling   No facility-administered encounter medications on file as of 10/10/2021.    Allergies (verified) Clonazepam, Quinapril, and Sertraline hcl   History: Past Medical History:  Diagnosis Date   Anxiety    takes xanax for anxiety   Arthritis    Cancer ([place])    melanoma on nose    GERD (gastroesophageal reflux  disease)    Hyperlipidemia    Hypertension    Pneumonia    had pneumonia 4-5 months ago   Recurrent upper respiratory infection (URI)    recent chest cold - treated with mucinex and cough medicine - now improved   Past Surgical History:  Procedure Laterality Date   ANTERIOR CERVICAL DECOMP/DISCECTOMY FUSION  09/25/2011   Procedure: ANTERIOR CERVICAL  DECOMPRESSION/DISCECTOMY FUSION 1 LEVEL;  Surgeon: Dahlia Bailiff;  Location: [facility];  Service: Orthopedics;  Laterality: N/A;  ACDF Cervical 5-6   COLONOSCOPY     ELBOW SURGERY     both elbow surgery   KNEE ARTHROSCOPY     left   Family History  Problem Relation Age of Onset   Prostate cancer Father        prostate   Bladder Cancer Father    Prostate cancer Brother    Colon cancer Neg Hx    Colon polyps Neg Hx    Esophageal cancer Neg Hx    Rectal cancer Neg Hx    Stomach cancer Neg Hx    Social History   Socioeconomic History   Marital status: Single    Spouse name: Not on file   Number of children: 2   Years of education: Not on file   Highest education level: Not on file  Occupational History   Occupation: Retired    Comment: Service Department  Tobacco Use   Smoking status: Never   Smokeless tobacco: Never  Vaping Use   Vaping Use: Never used  Substance and Sexual Activity   Alcohol use: Yes    Alcohol/week: 1.0 standard drink    Types: 1 Cans of beer per week    Comment: occasional beer   Drug use: No   Sexual activity: Yes  Other Topics Concern   Not on file  Social History Narrative   Not on file   Social Determinants of Health   Financial Resource Strain: Low Risk    Difficulty of Paying Living Expenses: Not hard at all  Food Insecurity: No Food Insecurity   Worried About Charity fundraiser in the Last Year: Never true   [place] in the Last Year: Never true  Transportation Needs: No Transportation Needs   Lack of Transportation (Medical): No   Lack of Transportation (Non-Medical): No  Physical Activity: Insufficiently Active   Days of Exercise per Week: 2 days   Minutes of Exercise per Session: 30 min  Stress: No Stress Concern Present   Feeling of Stress : Not at all  Social Connections: Socially Isolated   Frequency of Communication with Friends and Family: Twice a week   Frequency of Social Gatherings with Friends and Family: Twice  a week   Attends Religious Services: Never   Printmaker: No   Attends Music therapist: Never   Marital Status: Divorced    Tobacco Counseling Counseling given: Not Answered   Clinical Intake:  Pre-visit preparation completed: Yes  Pain : No/denies pain     Nutritional Risks: None  How often do you need to have someone help you when you read instructions, pamphlets, or other written materials from your doctor or pharmacy?: 1 - Never What is the last grade level you completed in school?: High School  Diabetic?no   Interpreter Needed?: No  Information entered by :: L.Shaundrea Carrigg,LPN   Activities of Daily Living In your present state of health, do you have any difficulty performing the  following activities: 10/10/2021  Hearing? N  Vision? N  Difficulty concentrating or making decisions? N  Walking or climbing stairs? N  Dressing or bathing? N  Doing errands, shopping? N  Preparing Food and eating ? N  Using the Toilet? N  In the past six months, have you accidently leaked urine? N  Do you have problems with loss of bowel control? N  Managing your Medications? N  Managing your Finances? N  Housekeeping or managing your Housekeeping? N  Some recent data might be hidden    Patient Care Team: Maximiano Coss, NP as PCP - General (Adult Health Nurse Practitioner) Suella Broad, MD as Consulting Physician (Physical Medicine and Rehabilitation)  Indicate any recent Medical Services you may have received from other than [facility] providers in the past year (date may be approximate).     Assessment:   This is a routine wellness examination for [facility]  Hearing/Vision screen No results found.  Dietary issues and exercise activities discussed: Current Exercise Habits: Home exercise routine, Type of exercise: walking, Time (Minutes): 30, Frequency (Times/Week): 2, Weekly Exercise (Minutes/Week): 60, Intensity: Mild, Exercise limited by:  None identified   Goals Addressed             This Visit's Progress    Patient Stated   On track    Drink more water       Depression Screen PHQ 2/9 Scores 10/10/2021 10/10/2021 02/12/2021 09/03/2020 07/10/2020 01/30/2020 10/20/2019  PHQ - 2 Score 0 0 0 0 0 0 0  PHQ- 9 Score - - - - 0 1 0    Fall Risk Fall Risk  10/10/2021 04/01/2021 02/12/2021 09/03/2020 08/31/2019  Falls in the past year? 0 0 0 0 0  Number falls in past yr: 0 0 0 0 -  Injury with Fall? 0 0 0 0 0  Follow up Falls evaluation completed Falls evaluation completed Falls evaluation completed Falls prevention discussed Falls evaluation completed;Education provided;Falls prevention discussed    FALL RISK PREVENTION PERTAINING TO THE HOME:  Any stairs in or around the home? No  If so, are there any without handrails? No  Home free of loose throw rugs in walkways, pet beds, electrical cords, etc? Yes  Adequate lighting in your home to reduce risk of falls? Yes   ASSISTIVE DEVICES UTILIZED TO PREVENT FALLS:  Life alert? No  Use of a cane, walker or w/c? No  Grab bars in the bathroom? Yes  Shower chair or bench in shower? No  Elevated toilet seat or a handicapped toilet? No    Cognitive Function:  Normal cognitive status assessed by direct observation by this Nurse Health Advisor. No abnormalities found.         Immunizations  There is no immunization history on file for this patient.  TDAP status: Due, Education has been provided regarding the importance of this vaccine. Advised may receive this vaccine at local pharmacy or Health Dept. Aware to provide a copy of the vaccination record if obtained from local pharmacy or Health Dept. Verbalized acceptance and understanding.  Flu Vaccine status: Due, Education has been provided regarding the importance of this vaccine. Advised may receive this vaccine at local pharmacy or Health Dept. Aware to provide a copy of the vaccination record if obtained from local  pharmacy or Health Dept. Verbalized acceptance and understanding.  Pneumococcal vaccine status: Due, Education has been provided regarding the importance of this vaccine. Advised may receive this vaccine at local pharmacy or Health Dept. Aware to  provide a copy of the vaccination record if obtained from local pharmacy or Health Dept. Verbalized acceptance and understanding.  Covid-19 vaccine status: Declined, Education has been provided regarding the importance of this vaccine but patient still declined. Advised may receive this vaccine at local pharmacy or Health Dept.or vaccine clinic. Aware to provide a copy of the vaccination record if obtained from local pharmacy or Health Dept. Verbalized acceptance and understanding.  Qualifies for Shingles Vaccine? Yes   Zostavax completed No   Shingrix Completed?: No.    Education has been provided regarding the importance of this vaccine. Patient has been advised to call insurance company to determine out of pocket expense if they have not yet received this vaccine. Advised may also receive vaccine at local pharmacy or Health Dept. Verbalized acceptance and understanding.  Screening Tests Health Maintenance  Topic Date Due   COVID-19 Vaccine (1) Never done   Pneumonia Vaccine 85+ Years old (1 - PCV) Never done   Zoster Vaccines- Shingrix (1 of 2) Never done   TETANUS/TDAP  02/12/2022 (Originally 02/16/1975)   Hepatitis C Screening  02/12/2022 (Originally 02/17/1974)   INFLUENZA VACCINE  12/19/2087 (Originally 05/13/2021)   COLONOSCOPY (Pts 45-79yrs Insurance coverage will need to be confirmed)  06/02/2022   HPV VACCINES  Aged Out    Health Maintenance  Health Maintenance Due  Topic Date Due   COVID-19 Vaccine (1) Never done   Pneumonia Vaccine 61+ Years old (1 - PCV) Never done   Zoster Vaccines- Shingrix (1 of 2) Never done    Colorectal cancer screening: Type of screening: Colonoscopy. Completed 06/03/2019. Repeat every 3 years  Lung Cancer  Screening: (Low Dose CT Chest recommended if Age 73-80 years, 30 pack-year currently smoking OR have quit w/in 15years.) does not qualify.   Lung Cancer Screening Referral: n/a  Additional Screening:  Hepatitis C Screening: does qualify;   Vision Screening: Recommended annual ophthalmology exams for early detection of glaucoma and other disorders of the eye. Is the patient up to date with their annual eye exam?  Yes  Who is the provider or what is the name of the office in which the patient attends annual eye exams? Dr.Bowen  If pt is not established with a provider, would they like to be referred to a provider to establish care? No .   Dental Screening: Recommended annual dental exams for proper oral hygiene  Community Resource Referral / Chronic Care Management: CRR required this visit?  No   CCM required this visit?  No      Plan:     I have personally reviewed and noted the following in the patients chart:   Medical and social history Use of alcohol, tobacco or illicit drugs  Current medications and supplements including opioid prescriptions. Patient is currently taking opioid prescriptions. Information provided to patient regarding non-opioid alternatives. Patient advised to discuss non-opioid treatment plan with their provider. Functional ability and status Nutritional status Physical activity Advanced directives List of other physicians Hospitalizations, surgeries, and ER visits in previous 12 months Vitals Screenings to include cognitive, depression, and falls Referrals and appointments  In addition, I have reviewed and discussed with patient certain preventive protocols, quality metrics, and best practice recommendations. A written personalized care plan for preventive services as well as general preventive health recommendations were provided to patient.     Randel Pigg, LPN   [id number]   Nurse Notes: none

## 2021-10-17 ENCOUNTER — Ambulatory Visit (INDEPENDENT_AMBULATORY_CARE_PROVIDER_SITE_OTHER): Payer: Medicare Other | Admitting: Registered Nurse

## 2021-10-17 VITALS — BP 152/80 | HR 72 | Temp 98.0°F | Resp 17 | Ht >= 80 in | Wt 280.0 lb

## 2021-10-17 DIAGNOSIS — R21 Rash and other nonspecific skin eruption: Secondary | ICD-10-CM

## 2021-10-17 MED ORDER — TRIAMCINOLONE ACETONIDE 0.5 % EX OINT: TOPICAL_OINTMENT | CUTANEOUS | 0 refills | Status: DC

## 2021-10-17 MED ORDER — PREDNISONE 10 MG PO TABS: ORAL_TABLET | ORAL | 0 refills | Status: AC

## 2021-10-17 NOTE — Progress Notes (Signed)
Established Patient Office Visit  Subjective:  Patient ID: Wayne Rhodes, male    DOB: October 18, 1955  Age: 66 y.o. MRN: [id number]  CC:  Chief Complaint  Patient presents with   Rash    Patient states he has had a rash on both arms, neck and both legs. Pt states he hs tried otc stuff with not much relief. Goes to see dermatology 10/31/2020    HPI Wayne Rhodes presents for rash  Both arms, neck, and both legs.  Onset 1 week ago. Red, flat Itching, not painful No lesions, no drainage, no skin breakdown Up towards groin, not onto scrotum or penis.  Has an appt with dermatology on 10/31/21  Past Medical History:  Diagnosis Date   Anxiety    takes xanax for anxiety   Arthritis    Cancer ()    melanoma on nose    GERD (gastroesophageal reflux disease)    Hyperlipidemia    Hypertension    Pneumonia    had pneumonia 4-5 months ago   Recurrent upper respiratory infection (URI)    recent chest cold - treated with mucinex and cough medicine - now improved    Past Surgical History:  Procedure Laterality Date   ANTERIOR CERVICAL DECOMP/DISCECTOMY FUSION  09/25/2011   Procedure: ANTERIOR CERVICAL DECOMPRESSION/DISCECTOMY FUSION 1 LEVEL;  Surgeon: Dahlia Bailiff;  Location: [facility];  Service: Orthopedics;  Laterality: N/A;  ACDF Cervical 5-6   COLONOSCOPY     ELBOW SURGERY     both elbow surgery   KNEE ARTHROSCOPY     left    Family History  Problem Relation Age of Onset   Prostate cancer Father        prostate   Bladder Cancer Father    Prostate cancer Brother    Colon cancer Neg Hx    Colon polyps Neg Hx    Esophageal cancer Neg Hx    Rectal cancer Neg Hx    Stomach cancer Neg Hx     Social History   Socioeconomic History   Marital status: Single    Spouse name: Not on file   Number of children: 2   Years of education: Not on file   Highest education level: Not on file  Occupational History   Occupation: Retired    Comment: Service Department  Tobacco  Use   Smoking status: Never   Smokeless tobacco: Never  Vaping Use   Vaping Use: Never used  Substance and Sexual Activity   Alcohol use: Yes    Alcohol/week: 1.0 standard drink    Types: 1 Cans of beer per week    Comment: occasional beer   Drug use: No   Sexual activity: Yes  Other Topics Concern   Not on file  Social History Narrative   Not on file   Social Determinants of Health   Financial Resource Strain: Low Risk    Difficulty of Paying Living Expenses: Not hard at all  Food Insecurity: No Food Insecurity   Worried About Charity fundraiser in the Last Year: Never true   [place] in the Last Year: Never true  Transportation Needs: No Transportation Needs   Lack of Transportation (Medical): No   Lack of Transportation (Non-Medical): No  Physical Activity: Insufficiently Active   Days of Exercise per Week: 2 days   Minutes of Exercise per Session: 30 min  Stress: No Stress Concern Present   Feeling of Stress : Not at all  Social Connections: Socially Isolated   Frequency of Communication with Friends and Family: Twice a week   Frequency of Social Gatherings with Friends and Family: Twice a week   Attends Religious Services: Never   Printmaker: No   Attends Music therapist: Never   Marital Status: Divorced  Human resources officer Violence: Not At Risk   Fear of Current or Ex-Partner: No   Emotionally Abused: No   Physically Abused: No   Sexually Abused: No    Outpatient Medications Prior to Visit  Medication Sig Dispense Refill   acetic acid-hydrocortisone (VOSOL-HC) OTIC solution Place 3 drops into both ears 3 (three) times daily. 10 mL 3   ALPRAZolam (XANAX) 1 MG tablet TAKE 1/2 TO 1 TABLET BY MOUTH TWO TIMES DAILY AS NEEDED FOR ANXIETY. 60 tablet 2   atorvastatin (LIPITOR) 10 MG tablet TAKE 1 TABLET BY MOUTH EVERY DAY 30 tablet 0   EPINEPHrine (EPI-PEN) 0.3 mg/0.3 mL DEVI Inject 0.3 mg into the muscle as needed. For  anaphylaxis     L-Methylfolate 15 MG TABS Take 1 tablet by mouth daily.     methocarbamol (ROBAXIN) 500 MG tablet TAKE 1 TABLET BY MOUTH EVERY 8 HOURS AS NEEDED FOR MUSCLE SPASM 60 tablet 0   metoprolol succinate (TOPROL-XL) 25 MG 24 hr tablet TAKE 1 TABLET (25 MG TOTAL) BY MOUTH DAILY. 90 tablet 0   oxyCODONE (ROXICODONE) 15 MG immediate release tablet Take 15 mg by mouth every 6 (six) hours as needed (Dr. Nelva Bush).      potassium chloride SA (KLOR-CON M20) 20 MEQ tablet TAKE 1 TABLET BY MOUTH EVERY DAY 90 tablet 0   sildenafil (REVATIO) 20 MG tablet Take 1-2 tablets PO 30 minutes before desired effect. No more than 1 dose in 36 hours. 30 tablet 0   triamcinolone ointment (KENALOG) 0.5 % APPLY TO AFFECTED AREA TWICE A DAY 30 g 0   No facility-administered medications prior to visit.    Allergies  Allergen Reactions   Clonazepam     Angioedema    Quinapril     Angioedema    Sertraline Hcl Other (See Comments)    Suicidal thoughts    ROS Review of Systems  Constitutional: Negative.   HENT: Negative.    Eyes: Negative.   Respiratory: Negative.    Cardiovascular: Negative.   Gastrointestinal: Negative.   Genitourinary: Negative.   Musculoskeletal: Negative.   Skin:  Positive for rash. Negative for color change, pallor and wound.  Neurological: Negative.   Psychiatric/Behavioral: Negative.    All other systems reviewed and are negative.    Objective:    Physical Exam Constitutional:      General: He is not in acute distress.    Appearance: Normal appearance. He is normal weight. He is not ill-appearing, toxic-appearing or diaphoretic.  Cardiovascular:     Rate and Rhythm: Normal rate and regular rhythm.     Heart sounds: Normal heart sounds. No murmur heard.   No friction rub. No gallop.  Pulmonary:     Effort: Pulmonary effort is normal. No respiratory distress.     Breath sounds: Normal breath sounds. No stridor. No wheezing, rhonchi or rales.  Chest:     Chest wall:  No tenderness.  Skin:    Findings: Rash (red flat rash antecubital bilat, legs inner legs bilat, anterior neck) present.  Neurological:     General: No focal deficit present.     Mental Status: He is alert and oriented  to person, place, and time. Mental status is at baseline.  Psychiatric:        Mood and Affect: Mood normal.        Behavior: Behavior normal.        Thought Content: Thought content normal.        Judgment: Judgment normal.    BP (!) 152/80    Pulse 72    Temp 98 F (36.7 C) (Temporal)    Resp 17    Ht 6\' 9"  (2.057 m)    Wt 280 lb (127 kg)    SpO2 98%    BMI 30.00 kg/m  Wt Readings from Last 3 Encounters:  10/17/21 280 lb (127 kg)  04/01/21 266 lb (120.7 kg)  02/12/21 273 lb 9.6 oz (124.1 kg)     Health Maintenance Due  Topic Date Due   COVID-19 Vaccine (1) Never done   Pneumonia Vaccine 57+ Years old (1 - PCV) Never done   Zoster Vaccines- Shingrix (1 of 2) Never done    There are no preventive care reminders to display for this patient.  Lab Results  Component Value Date   TSH 2.376 09/16/2019   Lab Results  Component Value Date   WBC 5.8 09/16/2019   HGB 18.1 (H) 09/16/2019   HCT 51.7 09/16/2019   MCV 90.4 09/16/2019   PLT 229 09/16/2019   Lab Results  Component Value Date   NA 136 02/27/2020   K 3.7 02/27/2020   CO2 29 02/27/2020   GLUCOSE 155 (H) 02/27/2020   BUN 20 02/27/2020   CREATININE 0.87 02/27/2020   BILITOT 0.8 04/06/2020   ALKPHOS 49 04/06/2020   AST 14 04/06/2020   ALT 15 04/06/2020   PROT 6.1 04/06/2020   ALBUMIN 3.4 (L) 04/06/2020   CALCIUM 8.4 02/27/2020   ANIONGAP 12 09/16/2019   GFR 87.79 02/27/2020   Lab Results  Component Value Date   CHOL 129 04/06/2020   Lab Results  Component Value Date   HDL 24.60 (L) 04/06/2020   Lab Results  Component Value Date   LDLCALC 82 04/06/2020   Lab Results  Component Value Date   TRIG 114.0 04/06/2020   Lab Results  Component Value Date   CHOLHDL 5 04/06/2020   Lab  Results  Component Value Date   HGBA1C 6.2 08/31/2019      Assessment & Plan:   Problem List Items Addressed This Visit   None Visit Diagnoses     Rash and nonspecific skin eruption    -  Primary   Relevant Medications   triamcinolone ointment (KENALOG) 0.5 %   predniSONE (DELTASONE) 10 MG tablet       Meds ordered this encounter  Medications   triamcinolone ointment (KENALOG) 0.5 %    Sig: APPLY TO AFFECTED AREA TWICE A DAY    Dispense:  30 g    Refill:  0    Order Specific Question:   Supervising Provider    Answer:   Carlota Raspberry, JEFFREY R [[id number]]   predniSONE (DELTASONE) 10 MG tablet    Sig: Take 4 tablets (40 mg total) by mouth daily with breakfast for 2 days, THEN 3 tablets (30 mg total) daily with breakfast for 2 days, THEN 2 tablets (20 mg total) daily with breakfast for 2 days, THEN 1 tablet (10 mg total) daily with breakfast for 2 days.    Dispense:  20 tablet    Refill:  0    Order Specific Question:   Supervising  Provider    Answer:   Carlota Raspberry, JEFFREY R [[id number]]    Follow-up: No follow-ups on file.   PLAN Unclear etiology. No apparent triggers at home or in lifestyle. Will give steroids and encourage him to keep follow up with dermatology Return if worsening sooner than appt with dermatology Patient encouraged to call clinic with any questions, comments, or concerns.  Maximiano Coss, NP

## 2021-10-17 NOTE — Patient Instructions (Signed)
° ° ° °  If you have lab work done today you will be contacted with your lab results within the next 2 weeks.  If you have not heard from us then please contact us. The fastest way to get your results is to register for My Chart. ° ° °IF you received an x-ray today, you will receive an invoice from [facility]. Please contact [facility] at [phone] with questions or concerns regarding your invoice.  ° °IF you received labwork today, you will receive an invoice from [facility]. Please contact [facility] at [phone] with questions or concerns regarding your invoice.  ° °Our billing staff will not be able to assist you with questions regarding bills from these companies. ° °You will be contacted with the lab results as soon as they are available. The fastest way to get your results is to activate your My Chart account. Instructions are located on the last page of this paperwork. If you have not heard from us regarding the results in 2 weeks, please contact this office. °  ° ° ° °

## 2021-11-15 ENCOUNTER — Other Ambulatory Visit: Payer: Self-pay | Admitting: Registered Nurse

## 2021-11-15 DIAGNOSIS — E785 Hyperlipidemia, unspecified: Secondary | ICD-10-CM

## 2021-11-21 ENCOUNTER — Ambulatory Visit: Payer: Medicare Other | Admitting: Registered Nurse

## 2021-11-24 ENCOUNTER — Other Ambulatory Visit: Payer: Self-pay | Admitting: Registered Nurse

## 2021-11-24 DIAGNOSIS — R739 Hyperglycemia, unspecified: Secondary | ICD-10-CM

## 2021-11-26 ENCOUNTER — Ambulatory Visit (INDEPENDENT_AMBULATORY_CARE_PROVIDER_SITE_OTHER): Payer: Medicare Other | Admitting: Registered Nurse

## 2021-11-26 ENCOUNTER — Encounter: Payer: Self-pay | Admitting: Registered Nurse

## 2021-11-26 VITALS — BP 148/83 | HR 69 | Temp 98.3°F | Resp 17 | Ht >= 80 in | Wt 272.4 lb

## 2021-11-26 DIAGNOSIS — R21 Rash and other nonspecific skin eruption: Secondary | ICD-10-CM

## 2021-11-26 MED ORDER — HYDROXYZINE HCL 10 MG PO TABS: 5.0000 mg | ORAL_TABLET | Freq: Three times a day (TID) | ORAL | 1 refills | Status: DC | PRN

## 2021-11-26 NOTE — Patient Instructions (Signed)
Mr. Raynold Blankenbaker to see you  Try hydroxyzine 5-10mg  three times daily as needed  Call if you aren't getting better or if you're getting worse  I'll touch base next week at some point to see how things are going.  Thanks,  Denice Paradise

## 2021-11-26 NOTE — Progress Notes (Signed)
Established Patient Office Visit  Subjective:  Patient ID: Wayne Rhodes, male    DOB: 05/31/1956  Age: 66 y.o. MRN: [id number]  CC:  Chief Complaint  Patient presents with   Follow-up    Patient states he is returning for the rash he saw you for. Patient states he thinks the rash is going away but still itching. Also having some right side pain .    HPI Wayne Rhodes presents for follow up   Rash improving, but not resolved.  Red flat rash that had been across both arms, neck, both legs.  Itching, no pain. Had given him prednisone taper and triamcinolone, which he used with some effect.   Had improved sufficiently that he had canceled his appt with dermatology.  Worst on chest. Still some in groin. No weeping or oozing. Poorly differentiated red flat rash No changes to medication, hygiene products, or environment.  Past Medical History:  Diagnosis Date   Anxiety    takes xanax for anxiety   Arthritis    Cancer ([place])    melanoma on nose    GERD (gastroesophageal reflux disease)    Hyperlipidemia    Hypertension    Pneumonia    had pneumonia 4-5 months ago   Recurrent upper respiratory infection (URI)    recent chest cold - treated with mucinex and cough medicine - now improved    Past Surgical History:  Procedure Laterality Date   ANTERIOR CERVICAL DECOMP/DISCECTOMY FUSION  09/25/2011   Procedure: ANTERIOR CERVICAL DECOMPRESSION/DISCECTOMY FUSION 1 LEVEL;  Surgeon: Dahlia Bailiff;  Location: [facility];  Service: Orthopedics;  Laterality: N/A;  ACDF Cervical 5-6   COLONOSCOPY     ELBOW SURGERY     both elbow surgery   KNEE ARTHROSCOPY     left    Family History  Problem Relation Age of Onset   Prostate cancer Father        prostate   Bladder Cancer Father    Prostate cancer Brother    Colon cancer Neg Hx    Colon polyps Neg Hx    Esophageal cancer Neg Hx    Rectal cancer Neg Hx    Stomach cancer Neg Hx     Social History   Socioeconomic History    Marital status: Single    Spouse name: Not on file   Number of children: 2   Years of education: Not on file   Highest education level: Not on file  Occupational History   Occupation: Retired    Comment: Service Department  Tobacco Use   Smoking status: Never   Smokeless tobacco: Never  Vaping Use   Vaping Use: Never used  Substance and Sexual Activity   Alcohol use: Yes    Alcohol/week: 1.0 standard drink    Types: 1 Cans of beer per week    Comment: occasional beer   Drug use: No   Sexual activity: Yes  Other Topics Concern   Not on file  Social History Narrative   Not on file   Social Determinants of Health   Financial Resource Strain: Low Risk    Difficulty of Paying Living Expenses: Not hard at all  Food Insecurity: No Food Insecurity   Worried About Charity fundraiser in the Last Year: Never true   [place] in the Last Year: Never true  Transportation Needs: No Transportation Needs   Lack of Transportation (Medical): No   Lack of Transportation (Non-Medical): No  Physical  Activity: Insufficiently Active   Days of Exercise per Week: 2 days   Minutes of Exercise per Session: 30 min  Stress: No Stress Concern Present   Feeling of Stress : Not at all  Social Connections: Socially Isolated   Frequency of Communication with Friends and Family: Twice a week   Frequency of Social Gatherings with Friends and Family: Twice a week   Attends Religious Services: Never   Marine scientist or Organizations: No   Attends Music therapist: Never   Marital Status: Divorced  Human resources officer Violence: Not At Risk   Fear of Current or Ex-Partner: No   Emotionally Abused: No   Physically Abused: No   Sexually Abused: No    Outpatient Medications Prior to Visit  Medication Sig Dispense Refill   acetic acid-hydrocortisone (VOSOL-HC) OTIC solution Place 3 drops into both ears 3 (three) times daily. 10 mL 3   ALPRAZolam (XANAX) 1 MG tablet TAKE 1/2 TO 1  TABLET BY MOUTH TWO TIMES DAILY AS NEEDED FOR ANXIETY. 60 tablet 2   atorvastatin (LIPITOR) 10 MG tablet TAKE 1 TABLET BY MOUTH EVERY DAY 90 tablet 0   EPINEPHrine (EPI-PEN) 0.3 mg/0.3 mL DEVI Inject 0.3 mg into the muscle as needed. For anaphylaxis     L-Methylfolate 15 MG TABS Take 1 tablet by mouth daily.     methocarbamol (ROBAXIN) 500 MG tablet TAKE 1 TABLET BY MOUTH EVERY 8 HOURS AS NEEDED FOR MUSCLE SPASM 60 tablet 0   metoprolol succinate (TOPROL-XL) 25 MG 24 hr tablet TAKE 1 TABLET (25 MG TOTAL) BY MOUTH DAILY. 90 tablet 0   oxyCODONE (ROXICODONE) 15 MG immediate release tablet Take 15 mg by mouth every 6 (six) hours as needed (Dr. Nelva Bush).      potassium chloride SA (KLOR-CON M20) 20 MEQ tablet TAKE 1 TABLET BY MOUTH EVERY DAY 90 tablet 0   sildenafil (REVATIO) 20 MG tablet Take 1-2 tablets PO 30 minutes before desired effect. No more than 1 dose in 36 hours. 30 tablet 0   triamcinolone ointment (KENALOG) 0.5 % APPLY TO AFFECTED AREA TWICE A DAY 30 g 0   No facility-administered medications prior to visit.    Allergies  Allergen Reactions   Clonazepam     Angioedema    Quinapril     Angioedema    Sertraline Hcl Other (See Comments)    Suicidal thoughts    ROS Review of Systems  Constitutional: Negative.   HENT: Negative.    Eyes: Negative.   Respiratory: Negative.    Cardiovascular: Negative.   Gastrointestinal: Negative.   Genitourinary: Negative.   Musculoskeletal: Negative.   Skin:  Positive for rash. Negative for color change, pallor and wound.  Neurological: Negative.   Psychiatric/Behavioral: Negative.    All other systems reviewed and are negative.    Objective:    Physical Exam Constitutional:      General: He is not in acute distress.    Appearance: Normal appearance. He is normal weight. He is not ill-appearing, toxic-appearing or diaphoretic.  Cardiovascular:     Rate and Rhythm: Normal rate and regular rhythm.     Heart sounds: Normal heart  sounds. No murmur heard.   No friction rub. No gallop.  Pulmonary:     Effort: Pulmonary effort is normal. No respiratory distress.     Breath sounds: Normal breath sounds. No stridor. No wheezing, rhonchi or rales.  Chest:     Chest wall: No tenderness.  Skin:  Findings: Rash (red flat poorly circumscribed rash on upper chest and groin bilaterally.) present.  Neurological:     General: No focal deficit present.     Mental Status: He is alert and oriented to person, place, and time. Mental status is at baseline.  Psychiatric:        Mood and Affect: Mood normal.        Behavior: Behavior normal.        Thought Content: Thought content normal.        Judgment: Judgment normal.    BP (!) 148/83    Pulse 69    Temp 98.3 F (36.8 C) (Temporal)    Resp 17    Ht 6\' 9"  (2.057 m)    Wt 272 lb 6.4 oz (123.6 kg)    SpO2 96%    BMI 29.19 kg/m  Wt Readings from Last 3 Encounters:  11/26/21 272 lb 6.4 oz (123.6 kg)  10/17/21 280 lb (127 kg)  04/01/21 266 lb (120.7 kg)     Health Maintenance Due  Topic Date Due   COVID-19 Vaccine (1) Never done   Zoster Vaccines- Shingrix (1 of 2) Never done   Pneumonia Vaccine 85+ Years old (1 - PCV) Never done    There are no preventive care reminders to display for this patient.  Lab Results  Component Value Date   TSH 2.376 09/16/2019   Lab Results  Component Value Date   WBC 5.8 09/16/2019   HGB 18.1 (H) 09/16/2019   HCT 51.7 09/16/2019   MCV 90.4 09/16/2019   PLT 229 09/16/2019   Lab Results  Component Value Date   NA 136 02/27/2020   K 3.7 02/27/2020   CO2 29 02/27/2020   GLUCOSE 155 (H) 02/27/2020   BUN 20 02/27/2020   CREATININE 0.87 02/27/2020   BILITOT 0.8 04/06/2020   ALKPHOS 49 04/06/2020   AST 14 04/06/2020   ALT 15 04/06/2020   PROT 6.1 04/06/2020   ALBUMIN 3.4 (L) 04/06/2020   CALCIUM 8.4 02/27/2020   ANIONGAP 12 09/16/2019   GFR 87.79 02/27/2020   Lab Results  Component Value Date   CHOL 129 04/06/2020    Lab Results  Component Value Date   HDL 24.60 (L) 04/06/2020   Lab Results  Component Value Date   LDLCALC 82 04/06/2020   Lab Results  Component Value Date   TRIG 114.0 04/06/2020   Lab Results  Component Value Date   CHOLHDL 5 04/06/2020   Lab Results  Component Value Date   HGBA1C 6.2 08/31/2019      Assessment & Plan:   Problem List Items Addressed This Visit   None Visit Diagnoses     Rash and nonspecific skin eruption    -  Primary   Relevant Medications   hydrOXYzine (ATARAX) 10 MG tablet       Meds ordered this encounter  Medications   hydrOXYzine (ATARAX) 10 MG tablet    Sig: Take 0.5-1 tablets (5-10 mg total) by mouth 3 (three) times daily as needed.    Dispense:  90 tablet    Refill:  1    Order Specific Question:   Supervising Provider    Answer:   Carlota Raspberry, JEFFREY R [[id number]]    Follow-up: No follow-ups on file.   PLAN Continues to look like inflammatory rash of undetermined significance  Will try hydroxyzine 5-10mg  po tid prn  Suggest follow up with derm if persistent Patient encouraged to call clinic with any questions, comments,  or concerns.  Maximiano Coss, NP

## 2021-12-04 ENCOUNTER — Other Ambulatory Visit: Payer: Self-pay | Admitting: Registered Nurse

## 2021-12-04 DIAGNOSIS — F419 Anxiety disorder, unspecified: Secondary | ICD-10-CM

## 2021-12-05 ENCOUNTER — Encounter: Payer: Self-pay | Admitting: Registered Nurse

## 2021-12-17 DIAGNOSIS — M5136 Other intervertebral disc degeneration, lumbar region: Secondary | ICD-10-CM | POA: Diagnosis not present

## 2021-12-17 DIAGNOSIS — M5416 Radiculopathy, lumbar region: Secondary | ICD-10-CM | POA: Diagnosis not present

## 2021-12-17 DIAGNOSIS — Z5181 Encounter for therapeutic drug level monitoring: Secondary | ICD-10-CM | POA: Diagnosis not present

## 2021-12-17 DIAGNOSIS — Z79899 Other long term (current) drug therapy: Secondary | ICD-10-CM | POA: Diagnosis not present

## 2021-12-18 ENCOUNTER — Other Ambulatory Visit: Payer: Self-pay | Admitting: Registered Nurse

## 2021-12-18 DIAGNOSIS — R21 Rash and other nonspecific skin eruption: Secondary | ICD-10-CM

## 2022-01-02 ENCOUNTER — Other Ambulatory Visit: Payer: Self-pay | Admitting: Registered Nurse

## 2022-01-02 DIAGNOSIS — F419 Anxiety disorder, unspecified: Secondary | ICD-10-CM

## 2022-01-02 NOTE — Telephone Encounter (Signed)
Patient is requesting a refill of the following medications: ?Requested Prescriptions  ? ?Pending Prescriptions Disp Refills  ? ALPRAZolam (XANAX) 1 MG tablet [Pharmacy Med Name: ALPRAZOLAM 1 MG TABLET] 60 tablet 0  ?  Sig: TAKE 1/2 TO 1 TABLET BY MOUTH TWO TIMES DAILY AS NEEDED FOR ANXIETY.  ? ? ?Date of patient request: 01/02/22 ?Last office visit: 12/04/21 ?Date of last refill: 11/26/21 ?Last refill amount: 60 ? ? ?

## 2022-01-28 ENCOUNTER — Ambulatory Visit (INDEPENDENT_AMBULATORY_CARE_PROVIDER_SITE_OTHER): Payer: Medicare Other | Admitting: Family Medicine

## 2022-01-28 ENCOUNTER — Encounter: Payer: Self-pay | Admitting: Family Medicine

## 2022-01-28 VITALS — BP 120/82 | HR 64 | Temp 98.6°F | Wt 271.1 lb

## 2022-01-28 DIAGNOSIS — L723 Sebaceous cyst: Secondary | ICD-10-CM

## 2022-01-28 DIAGNOSIS — H9193 Unspecified hearing loss, bilateral: Secondary | ICD-10-CM

## 2022-01-28 NOTE — Progress Notes (Signed)
? ?  Subjective:  ? ? Patient ID: Wayne Rhodes, male    DOB: 03-12-1956, 66 y.o.   MRN: [id number] ? ?HPI ?Here to check his ears and to check a lump in the back that he noticed about 4 weeks ago. The lump does not bother him at all. He has a hx of wax buildups in his ears, and lately he has had more hearing difficulty than usual. There is no pain.  ? ? ?Review of Systems  ?Constitutional: Negative.   ?HENT:  Positive for hearing loss. Negative for congestion, ear discharge, ear pain and sinus pressure.   ?Eyes: Negative.   ?Respiratory: Negative.    ?Cardiovascular: Negative.   ?Neurological:  Negative for dizziness.  ? ?   ?Objective:  ? Physical Exam ?Constitutional:   ?   Appearance: Normal appearance.  ?HENT:  ?   Right Ear: Tympanic membrane, ear canal and external ear normal.  ?   Left Ear: Tympanic membrane, ear canal and external ear normal.  ?Cardiovascular:  ?   Rate and Rhythm: Normal rate and regular rhythm.  ?   Pulses: Normal pulses.  ?   Heart sounds: Normal heart sounds.  ?Pulmonary:  ?   Effort: Pulmonary effort is normal.  ?   Breath sounds: Normal breath sounds.  ?Skin: ?   Comments: There is a sebaceous cyst in the middle of the upper back. This is not tender   ?Neurological:  ?   Mental Status: He is alert.  ? ? ? ? ? ?   ?Assessment & Plan:  ?I reassured him that there is no wax in his ears. I suggested he have his hearing tested by an audiologist. As for the cyst, we agreed to simply monitor this for now. He will follow up with his PCP, Maximiano Coss, as needed.  ?Alysia Penna, MD ? ? ?

## 2022-02-03 ENCOUNTER — Other Ambulatory Visit: Payer: Self-pay | Admitting: Registered Nurse

## 2022-02-03 DIAGNOSIS — F419 Anxiety disorder, unspecified: Secondary | ICD-10-CM

## 2022-02-12 ENCOUNTER — Ambulatory Visit (INDEPENDENT_AMBULATORY_CARE_PROVIDER_SITE_OTHER): Payer: Medicare Other | Admitting: Registered Nurse

## 2022-02-12 ENCOUNTER — Encounter: Payer: Self-pay | Admitting: Registered Nurse

## 2022-02-12 ENCOUNTER — Other Ambulatory Visit: Payer: Self-pay

## 2022-02-12 VITALS — BP 132/86 | HR 95 | Temp 98.3°F | Resp 18 | Ht >= 80 in | Wt 274.2 lb

## 2022-02-12 DIAGNOSIS — L299 Pruritus, unspecified: Secondary | ICD-10-CM | POA: Diagnosis not present

## 2022-02-12 DIAGNOSIS — L02219 Cutaneous abscess of trunk, unspecified: Secondary | ICD-10-CM

## 2022-02-12 DIAGNOSIS — H938X3 Other specified disorders of ear, bilateral: Secondary | ICD-10-CM

## 2022-02-12 MED ORDER — HYDROCORTISONE-ACETIC ACID 1-2 % OT SOLN: 3.0000 [drp] | Freq: Three times a day (TID) | OTIC | 3 refills | Status: DC

## 2022-02-12 MED ORDER — DOXYCYCLINE HYCLATE 100 MG PO TABS: 100.0000 mg | ORAL_TABLET | Freq: Two times a day (BID) | ORAL | 0 refills | Status: DC

## 2022-02-12 MED ORDER — AZELASTINE HCL 0.1 % NA SOLN: 1.0000 | Freq: Two times a day (BID) | NASAL | 12 refills | Status: DC

## 2022-02-12 NOTE — Patient Instructions (Addendum)
Wayne Rhodes -  ? ?An early happy birthday to you! ? ?Call if the cyst recurs ? ?Let me know if ear symptoms persist. We can get you in to see an ENT doc if we need to. ? ?Thank you, ? ?Rich  ? ? ? ?If you have lab work done today you will be contacted with your lab results within the next 2 weeks.  If you have not heard from [place] then please contact us. The fastest way to get your results is to register for My Chart. ? ? ?IF you received an x-ray today, you will receive an invoice from [facility]. Please contact [facility] at [phone] with questions or concerns regarding your invoice.  ? ?IF you received labwork today, you will receive an invoice from [facility]. Please contact [facility] at [phone] with questions or concerns regarding your invoice.  ? ?Our billing staff will not be able to assist you with questions regarding bills from these companies. ? ?You will be contacted with the lab results as soon as they are available. The fastest way to get your results is to activate your My Chart account. Instructions are located on the last page of this paperwork. If you have not heard from [place] regarding the results in 2 weeks, please contact this office. ?  ? ? ?

## 2022-02-12 NOTE — Progress Notes (Signed)
? ?Established Patient Office Visit ? ?Subjective:  ?Patient ID: Wayne Rhodes, male    DOB: [birth year]  Age: 66 y.o. MRN: [id number] ? ?CC:  ?Chief Complaint  ?Patient presents with  ? Follow-up  ?  Patient states he is here to follow up on the cyst on his back and also his ears feel like they have fluid when he is sleeping  ? ? ?HPI ?Wayne Rhodes presents for follow up ? ?Sebaceous cyst on his back. Noted by Dr. Sarajane Jews on 01/28/22. ?Opted for observation ?Notes it feels like it has become larger in past few weeks per pt girlfriend ?No pain or drainage that he has noted.  ?No fevers, chills, sweats, nvd. ? ?Fluid in ears: ?Mostly at night ?Some itching and flaking in ear canal ?No drainage ?Baseline limited hearing, declines audiology work up. ? ? ?Outpatient Medications Prior to Visit  ?Medication Sig Dispense Refill  ? ALPRAZolam (XANAX) 1 MG tablet TAKE 1/2 TO 1 TABLET BY MOUTH TWO TIMES DAILY AS NEEDED FOR ANXIETY. 60 tablet 0  ? atorvastatin (LIPITOR) 10 MG tablet TAKE 1 TABLET BY MOUTH EVERY DAY 90 tablet 0  ? EPINEPHrine (EPI-PEN) 0.3 mg/0.3 mL DEVI Inject 0.3 mg into the muscle as needed. For anaphylaxis    ? hydrOXYzine (ATARAX) 10 MG tablet TAKE 1/2 TO 1 TABLET (5-10 MG TOTAL) BY MOUTH 3 TIMES A DAY AS NEEDED 270 tablet 1  ? L-Methylfolate 15 MG TABS Take 1 tablet by mouth daily.    ? methocarbamol (ROBAXIN) 500 MG tablet TAKE 1 TABLET BY MOUTH EVERY 8 HOURS AS NEEDED FOR MUSCLE SPASM 60 tablet 0  ? metoprolol succinate (TOPROL-XL) 25 MG 24 hr tablet TAKE 1 TABLET (25 MG TOTAL) BY MOUTH DAILY. 90 tablet 0  ? oxyCODONE (ROXICODONE) 15 MG immediate release tablet Take 15 mg by mouth every 6 (six) hours as needed (Dr. Nelva Bush).     ? potassium chloride SA (KLOR-CON M20) 20 MEQ tablet TAKE 1 TABLET BY MOUTH EVERY DAY 90 tablet 0  ? sildenafil (REVATIO) 20 MG tablet Take 1-2 tablets PO 30 minutes before desired effect. No more than 1 dose in 36 hours. 30 tablet 0  ? triamcinolone ointment (KENALOG) 0.5 % APPLY TO  AFFECTED AREA TWICE A DAY 30 g 0  ? acetic acid-hydrocortisone (VOSOL-HC) OTIC solution Place 3 drops into both ears 3 (three) times daily. 10 mL 3  ? ?No facility-administered medications prior to visit.  ? ? ?Review of Systems ?Per hpi  ?  ?Objective:  ?  ? ?BP 132/86   Pulse 95   Temp 98.3 ?F (36.8 ?C) (Temporal)   Resp 18   Ht '6\' 9"'$  (2.057 m)   Wt 274 lb 3.2 oz (124.4 kg)   SpO2 94%   BMI 29.38 kg/m?  ? ?Wt Readings from Last 3 Encounters:  ?02/12/22 274 lb 3.2 oz (124.4 kg)  ?01/28/22 271 lb 2 oz (123 kg)  ?11/26/21 272 lb 6.4 oz (123.6 kg)  ? ?Physical Exam ?Constitutional:   ?   General: He is not in acute distress. ?   Appearance: Normal appearance. He is normal weight. He is not ill-appearing, toxic-appearing or diaphoretic.  ?HENT:  ?   Right Ear: Tympanic membrane, ear canal and external ear normal. There is no impacted cerumen.  ?   Left Ear: Tympanic membrane, ear canal and external ear normal. There is no impacted cerumen.  ?Cardiovascular:  ?   Rate and Rhythm: Normal rate and regular rhythm.  ?  Heart sounds: Normal heart sounds. No murmur heard. ?  No friction rub. No gallop.  ?Pulmonary:  ?   Effort: Pulmonary effort is normal. No respiratory distress.  ?   Breath sounds: Normal breath sounds. No stridor. No wheezing, rhonchi or rales.  ?Chest:  ?   Chest wall: No tenderness.  ?Skin: ?   Findings: Lesion (sebaceous cyst on back with spontaneous drainage) present.  ?Neurological:  ?   General: No focal deficit present.  ?   Mental Status: He is alert and oriented to person, place, and time. Mental status is at baseline.  ?Psychiatric:     ?   Mood and Affect: Mood normal.     ?   Behavior: Behavior normal.     ?   Thought Content: Thought content normal.     ?   Judgment: Judgment normal.  ? ? ?No results found for any visits on 02/12/22. ? ? ? ?The ASCVD Risk score (Arnett DK, et al., 2019) failed to calculate for the following reasons: ?  The valid total cholesterol range is 130 to 320  mg/dL ? ?  ?Assessment & Plan:  ? ?Problem List Items Addressed This Visit   ?None ?Visit Diagnoses   ? ? Abscess, trunk    -  Primary  ? Relevant Medications  ? doxycycline (VIBRA-TABS) 100 MG tablet  ? Itching of ear      ? Relevant Medications  ? azelastine (ASTELIN) 0.1 % nasal spray  ? Pressure sensation in both ears      ? Relevant Medications  ? acetic acid-hydrocortisone (VOSOL-HC) OTIC solution  ? azelastine (ASTELIN) 0.1 % nasal spray  ? ?  ? ? ?Meds ordered this encounter  ?Medications  ? doxycycline (VIBRA-TABS) 100 MG tablet  ?  Sig: Take 1 tablet (100 mg total) by mouth 2 (two) times daily.  ?  Dispense:  14 tablet  ?  Refill:  0  ?  Order Specific Question:   Supervising Provider  ?  Answer:   Carlota Raspberry, JEFFREY R [[id number]]  ? acetic acid-hydrocortisone (VOSOL-HC) OTIC solution  ?  Sig: Place 3 drops into both ears 3 (three) times daily.  ?  Dispense:  10 mL  ?  Refill:  3  ?  Order Specific Question:   Supervising Provider  ?  Answer:   Carlota Raspberry, JEFFREY R [[id number]]  ? azelastine (ASTELIN) 0.1 % nasal spray  ?  Sig: Place 1 spray into both nostrils 2 (two) times daily. Use in each nostril as directed  ?  Dispense:  30 mL  ?  Refill:  12  ?  Order Specific Question:   Supervising Provider  ?  Answer:   Carlota Raspberry, JEFFREY R [[id number]]  ? ? ?Return if symptoms worsen or fail to improve.  ? ?PLAN ?Withpressure, able to remove moderate amount of purulent and serosanguinous drainage from two places of spontaneous rupture on cyst. Reduced in size. Pt tolerated well. Will give doxycycline as above and let heal by secondary intention. Return if recurring. ?Ear pressure and itching - use azelastine nightly as instructed, vosol-hc tid prn ?Patient encouraged to call clinic with any questions, comments, or concerns. ? ? ?Maximiano Coss, NP ?

## 2022-02-13 DIAGNOSIS — M5416 Radiculopathy, lumbar region: Secondary | ICD-10-CM | POA: Diagnosis not present

## 2022-02-20 ENCOUNTER — Other Ambulatory Visit: Payer: Self-pay | Admitting: Registered Nurse

## 2022-02-20 DIAGNOSIS — E785 Hyperlipidemia, unspecified: Secondary | ICD-10-CM

## 2022-03-03 ENCOUNTER — Other Ambulatory Visit: Payer: Self-pay | Admitting: Registered Nurse

## 2022-03-03 DIAGNOSIS — F419 Anxiety disorder, unspecified: Secondary | ICD-10-CM

## 2022-03-03 NOTE — Telephone Encounter (Signed)
Patient is requesting a refill of the following medications: Requested Prescriptions   Pending Prescriptions Disp Refills   ALPRAZolam (XANAX) 1 MG tablet [Pharmacy Med Name: ALPRAZOLAM 1 MG TABLET] 60 tablet 0    Sig: TAKE 1/2 TO 1 TABLET BY MOUTH TWO TIMES DAILY AS NEEDED FOR ANXIETY.    Date of patient request: 03/03/2022 Last office visit: 02/12/2022 Date of last refill: 02/03/2022 Last refill amount: 60 tablets  Follow up time period per chart: none

## 2022-03-07 ENCOUNTER — Other Ambulatory Visit: Payer: Self-pay | Admitting: Registered Nurse

## 2022-03-07 DIAGNOSIS — R739 Hyperglycemia, unspecified: Secondary | ICD-10-CM

## 2022-03-22 ENCOUNTER — Other Ambulatory Visit: Payer: Self-pay | Admitting: Registered Nurse

## 2022-03-22 DIAGNOSIS — I10 Essential (primary) hypertension: Secondary | ICD-10-CM

## 2022-04-01 ENCOUNTER — Other Ambulatory Visit: Payer: Self-pay | Admitting: Registered Nurse

## 2022-04-01 DIAGNOSIS — F419 Anxiety disorder, unspecified: Secondary | ICD-10-CM

## 2022-04-02 NOTE — Telephone Encounter (Signed)
Patient is requesting a refill of the following medications: Requested Prescriptions   Pending Prescriptions Disp Refills   ALPRAZolam (XANAX) 1 MG tablet [Pharmacy Med Name: ALPRAZOLAM 1 MG TABLET] 60 tablet 0    Sig: TAKE 1/2 TO 1 TABLET BY MOUTH TWO TIMES DAILY AS NEEDED FOR ANXIETY.    Date of patient request: 04/02/22 Last office visit: 02/12/22 Date of last refill: 03/04/22 Last refill amount: 60

## 2022-04-02 NOTE — Telephone Encounter (Signed)
Chart reviewed in PCPs absence.  Appears last time alprazolam was reviewed was in May 2022 although patient has had subsequent visits for other issues.  Controlled substance database reviewed.  Alprazolam filled May 23, April 24, May 24, February 22, January 23 of this year.  temporary refill ordered but will need follow-up visit with Maximiano Coss to discuss this medication. Please schedule.

## 2022-04-08 DIAGNOSIS — L720 Epidermal cyst: Secondary | ICD-10-CM | POA: Diagnosis not present

## 2022-04-08 DIAGNOSIS — L821 Other seborrheic keratosis: Secondary | ICD-10-CM | POA: Diagnosis not present

## 2022-04-08 DIAGNOSIS — D225 Melanocytic nevi of trunk: Secondary | ICD-10-CM | POA: Diagnosis not present

## 2022-04-08 DIAGNOSIS — L814 Other melanin hyperpigmentation: Secondary | ICD-10-CM | POA: Diagnosis not present

## 2022-04-08 DIAGNOSIS — L570 Actinic keratosis: Secondary | ICD-10-CM | POA: Diagnosis not present

## 2022-04-08 DIAGNOSIS — D485 Neoplasm of uncertain behavior of skin: Secondary | ICD-10-CM | POA: Diagnosis not present

## 2022-04-08 DIAGNOSIS — L538 Other specified erythematous conditions: Secondary | ICD-10-CM | POA: Diagnosis not present

## 2022-04-08 DIAGNOSIS — Z08 Encounter for follow-up examination after completed treatment for malignant neoplasm: Secondary | ICD-10-CM | POA: Diagnosis not present

## 2022-04-08 DIAGNOSIS — Z85820 Personal history of malignant melanoma of skin: Secondary | ICD-10-CM | POA: Diagnosis not present

## 2022-04-08 DIAGNOSIS — L820 Inflamed seborrheic keratosis: Secondary | ICD-10-CM | POA: Diagnosis not present

## 2022-04-28 DIAGNOSIS — M5416 Radiculopathy, lumbar region: Secondary | ICD-10-CM | POA: Diagnosis not present

## 2022-04-28 DIAGNOSIS — M5136 Other intervertebral disc degeneration, lumbar region: Secondary | ICD-10-CM | POA: Diagnosis not present

## 2022-05-01 ENCOUNTER — Other Ambulatory Visit: Payer: Self-pay | Admitting: Family Medicine

## 2022-05-01 DIAGNOSIS — F419 Anxiety disorder, unspecified: Secondary | ICD-10-CM

## 2022-05-01 NOTE — Telephone Encounter (Signed)
Patient needs and appointment for more refills

## 2022-05-02 ENCOUNTER — Encounter: Payer: Self-pay | Admitting: Gastroenterology

## 2022-05-02 NOTE — Telephone Encounter (Signed)
Appt is scheduled

## 2022-05-09 ENCOUNTER — Encounter: Payer: Self-pay | Admitting: Gastroenterology

## 2022-05-13 ENCOUNTER — Encounter: Payer: Self-pay | Admitting: Gastroenterology

## 2022-05-14 ENCOUNTER — Encounter: Payer: Self-pay | Admitting: Registered Nurse

## 2022-05-14 ENCOUNTER — Ambulatory Visit (INDEPENDENT_AMBULATORY_CARE_PROVIDER_SITE_OTHER): Payer: Medicare Other | Admitting: Registered Nurse

## 2022-05-14 VITALS — BP 128/78 | HR 75 | Temp 98.8°F | Resp 20 | Ht >= 80 in | Wt 273.3 lb

## 2022-05-14 DIAGNOSIS — R739 Hyperglycemia, unspecified: Secondary | ICD-10-CM | POA: Diagnosis not present

## 2022-05-14 DIAGNOSIS — E785 Hyperlipidemia, unspecified: Secondary | ICD-10-CM | POA: Diagnosis not present

## 2022-05-14 DIAGNOSIS — R0789 Other chest pain: Secondary | ICD-10-CM

## 2022-05-14 DIAGNOSIS — F419 Anxiety disorder, unspecified: Secondary | ICD-10-CM

## 2022-05-14 DIAGNOSIS — R21 Rash and other nonspecific skin eruption: Secondary | ICD-10-CM

## 2022-05-14 DIAGNOSIS — I10 Essential (primary) hypertension: Secondary | ICD-10-CM

## 2022-05-14 LAB — COMPREHENSIVE METABOLIC PANEL
ALT: 19 U/L (ref 0–53)
AST: 22 U/L (ref 0–37)
Albumin: 4.3 g/dL (ref 3.5–5.2)
Alkaline Phosphatase: 44 U/L (ref 39–117)
BUN: 13 mg/dL (ref 6–23)
CO2: 26 mEq/L (ref 19–32)
Calcium: 9.4 mg/dL (ref 8.4–10.5)
Chloride: 105 mEq/L (ref 96–112)
Creatinine, Ser: 1.13 mg/dL (ref 0.40–1.50)
GFR: 66.91 mL/min (ref >60.00)
Glucose, Bld: 126 mg/dL — ABNORMAL HIGH (ref 70–99)
Potassium: 4.4 mEq/L (ref 3.5–5.1)
Sodium: 139 mEq/L (ref 135–145)
Total Bilirubin: 1 mg/dL (ref 0.2–1.2)
Total Protein: 7 g/dL (ref 6.0–8.3)

## 2022-05-14 LAB — CBC WITH DIFFERENTIAL/PLATELET
Basophils Absolute: 0.1 10*3/uL (ref 0.0–0.1)
Basophils Relative: 1 % (ref 0.0–3.0)
Eosinophils Absolute: 0.2 10*3/uL (ref 0.0–0.7)
Eosinophils Relative: 3.6 % (ref 0.0–5.0)
HCT: 52 % (ref 39.0–52.0)
Hemoglobin: 17.3 g/dL — ABNORMAL HIGH (ref 13.0–17.0)
Lymphocytes Relative: 21 % (ref 12.0–46.0)
Lymphs Abs: 1.2 10*3/uL (ref 0.7–4.0)
MCHC: 33.3 g/dL (ref 30.0–36.0)
MCV: 93.4 fl (ref 78.0–100.0)
Monocytes Absolute: 0.5 10*3/uL (ref 0.1–1.0)
Monocytes Relative: 9.7 % (ref 3.0–12.0)
Neutro Abs: 3.6 10*3/uL (ref 1.4–7.7)
Neutrophils Relative %: 64.7 % (ref 43.0–77.0)
Platelets: 193 10*3/uL (ref 150.0–400.0)
RBC: 5.57 Mil/uL (ref 4.22–5.81)
RDW: 14 % (ref 11.5–15.5)
WBC: 5.5 10*3/uL (ref 4.0–10.5)

## 2022-05-14 LAB — LIPID PANEL
Cholesterol: 125 mg/dL (ref 0–200)
HDL: 26.7 mg/dL — ABNORMAL LOW (ref >39.00)
NonHDL: 97.91
Total CHOL/HDL Ratio: 5
Triglycerides: 225 mg/dL — ABNORMAL HIGH (ref 0.0–149.0)
VLDL: 45 mg/dL — ABNORMAL HIGH (ref 0.0–40.0)

## 2022-05-14 LAB — HEMOGLOBIN A1C: Hgb A1c MFr Bld: 6.1 % (ref 4.6–6.5)

## 2022-05-14 LAB — LDL CHOLESTEROL, DIRECT: Direct LDL: 65 mg/dL

## 2022-05-14 MED ORDER — ATORVASTATIN CALCIUM 10 MG PO TABS: 10.0000 mg | ORAL_TABLET | Freq: Every day | ORAL | 1 refills | Status: DC

## 2022-05-14 MED ORDER — TRIAMCINOLONE ACETONIDE 0.5 % EX OINT: TOPICAL_OINTMENT | CUTANEOUS | 0 refills | Status: DC

## 2022-05-14 MED ORDER — METOPROLOL SUCCINATE ER 25 MG PO TB24: 25.0000 mg | ORAL_TABLET | Freq: Every day | ORAL | 1 refills | Status: DC

## 2022-05-14 MED ORDER — ALPRAZOLAM 1 MG PO TABS
ORAL_TABLET | ORAL | 1 refills | Status: DC
Start: 2022-05-14 — End: 2022-07-16

## 2022-05-14 MED ORDER — POTASSIUM CHLORIDE CRYS ER 20 MEQ PO TBCR: EXTENDED_RELEASE_TABLET | ORAL | 0 refills | Status: DC

## 2022-05-14 NOTE — Progress Notes (Signed)
Established Patient Office Visit  Subjective:  Patient ID: Wayne Rhodes, male    DOB: 1956/05/25  Age: 66 y.o. MRN: [id number]  CC:  Chief Complaint  Patient presents with   Medication Refill    Pt also states feels like there is fluid around his heart when he lays down as well as his big toe is hurting , pt states the heart issue has been going on since he had covid 3 years ago    HPI [name] presents for med refill, chest heaviness  Chest heaviness Pressure around chest when lying down.  No shob, doe, orthopnea Occurs 1-2 nights  Has not treated. No palpitations, chest pain, headache.   Toe Pain L great toe Medial edge of nail No drainage or abscess No injury or trauma  Anxiety Alprazolam use No AE Advised on risks with older adults. He voices understanding  HLD On statin therapy. Tolerates well without AE.  Outpatient Medications Prior to Visit  Medication Sig Dispense Refill   EPINEPHrine (EPI-PEN) 0.3 mg/0.3 mL DEVI Inject 0.3 mg into the muscle as needed. For anaphylaxis     L-Methylfolate 15 MG TABS Take 1 tablet by mouth daily.     oxyCODONE (ROXICODONE) 15 MG immediate release tablet Take 15 mg by mouth every 6 (six) hours as needed (Dr. Nelva Bush).      ALPRAZolam (XANAX) 1 MG tablet TAKE 1/2 TO 1 TABLET BY MOUTH TWO TIMES DAILY AS NEEDED FOR ANXIETY. 60 tablet 0   atorvastatin (LIPITOR) 10 MG tablet TAKE 1 TABLET BY MOUTH EVERY DAY 90 tablet 1   metoprolol succinate (TOPROL-XL) 25 MG 24 hr tablet TAKE 1 TABLET (25 MG TOTAL) BY MOUTH DAILY. 90 tablet 0   potassium chloride SA (KLOR-CON M20) 20 MEQ tablet TAKE 1 TABLET BY MOUTH EVERY DAY 90 tablet 0   triamcinolone ointment (KENALOG) 0.5 % APPLY TO AFFECTED AREA TWICE A DAY 30 g 0   acetic acid-hydrocortisone (VOSOL-HC) OTIC solution Place 3 drops into both ears 3 (three) times daily. (Patient not taking: Reported on 05/14/2022) 10 mL 3   azelastine (ASTELIN) 0.1 % nasal spray Place 1 spray into both  nostrils 2 (two) times daily. Use in each nostril as directed (Patient not taking: Reported on 05/14/2022) 30 mL 12   doxycycline (VIBRA-TABS) 100 MG tablet Take 1 tablet (100 mg total) by mouth 2 (two) times daily. (Patient not taking: Reported on 05/14/2022) 14 tablet 0   hydrOXYzine (ATARAX) 10 MG tablet TAKE 1/2 TO 1 TABLET (5-10 MG TOTAL) BY MOUTH 3 TIMES A DAY AS NEEDED (Patient not taking: Reported on 05/14/2022) 270 tablet 1   methocarbamol (ROBAXIN) 500 MG tablet TAKE 1 TABLET BY MOUTH EVERY 8 HOURS AS NEEDED FOR MUSCLE SPASM (Patient not taking: Reported on 05/14/2022) 60 tablet 0   sildenafil (REVATIO) 20 MG tablet Take 1-2 tablets PO 30 minutes before desired effect. No more than 1 dose in 36 hours. (Patient not taking: Reported on 05/14/2022) 30 tablet 0   No facility-administered medications prior to visit.    Review of Systems  Constitutional: Negative.   HENT: Negative.    Eyes: Negative.   Respiratory: Negative.    Cardiovascular: Negative.   Gastrointestinal: Negative.   Genitourinary: Negative.   Musculoskeletal: Negative.   Skin: Negative.   Neurological: Negative.   Psychiatric/Behavioral: Negative.    All other systems reviewed and are negative.     Objective:     BP 128/78   Pulse 75  Temp 98.8 F (37.1 C)   Resp 20   Ht '6\' 9"'$  (2.057 m)   Wt 273 lb 4.8 oz (124 kg)   SpO2 98%   BMI 29.29 kg/m   Wt Readings from Last 3 Encounters:  05/14/22 273 lb 4.8 oz (124 kg)  02/12/22 274 lb 3.2 oz (124.4 kg)  01/28/22 271 lb 2 oz (123 kg)   Physical Exam Constitutional:      General: He is not in acute distress.    Appearance: Normal appearance. He is normal weight. He is not ill-appearing, toxic-appearing or diaphoretic.  Cardiovascular:     Rate and Rhythm: Normal rate and regular rhythm.     Heart sounds: Normal heart sounds. No murmur heard.    No friction rub. No gallop.  Pulmonary:     Effort: Pulmonary effort is normal. No respiratory distress.     Breath  sounds: Normal breath sounds. No stridor. No wheezing, rhonchi or rales.  Chest:     Chest wall: No tenderness.  Musculoskeletal:     Right lower leg: No edema.     Left lower leg: No edema.  Neurological:     General: No focal deficit present.     Mental Status: He is alert and oriented to person, place, and time. Mental status is at baseline.  Psychiatric:        Mood and Affect: Mood normal.        Behavior: Behavior normal.        Thought Content: Thought content normal.        Judgment: Judgment normal.     No results found for any visits on 05/14/22.    The ASCVD Risk score (Arnett DK, et al., 2019) failed to calculate for the following reasons:   The valid total cholesterol range is 130 to 320 mg/dL    Assessment & Plan:   Problem List Items Addressed This Visit       Other   Anxiety   Relevant Medications   ALPRAZolam (XANAX) 1 MG tablet   Hyperglycemia   Relevant Medications   potassium chloride SA (KLOR-CON M20) 20 MEQ tablet   Other Relevant Orders   Comprehensive metabolic panel   CBC with Differential/Platelet   Lipid panel   Hemoglobin A1c   Other Visit Diagnoses     Chest heaviness    -  Primary   Relevant Orders   Ambulatory referral to Cardiology   Essential hypertension       Relevant Medications   atorvastatin (LIPITOR) 10 MG tablet   metoprolol succinate (TOPROL-XL) 25 MG 24 hr tablet   Other Relevant Orders   Comprehensive metabolic panel   CBC with Differential/Platelet   Lipid panel   Hemoglobin A1c   Hyperlipidemia, unspecified hyperlipidemia type       Relevant Medications   atorvastatin (LIPITOR) 10 MG tablet   metoprolol succinate (TOPROL-XL) 25 MG 24 hr tablet   Other Relevant Orders   Comprehensive metabolic panel   CBC with Differential/Platelet   Lipid panel   Hemoglobin A1c   Rash and nonspecific skin eruption       Relevant Medications   triamcinolone ointment (KENALOG) 0.5 %       Meds ordered this encounter   Medications   ALPRAZolam (XANAX) 1 MG tablet    Sig: Take 1 tablet by mouth twice daily as needed.    Dispense:  60 tablet    Refill:  1    Needs office visit - temporary  refill.    Order Specific Question:   Supervising Provider    Answer:   Carlota Raspberry, JEFFREY R [[id number]]   atorvastatin (LIPITOR) 10 MG tablet    Sig: Take 1 tablet (10 mg total) by mouth daily.    Dispense:  90 tablet    Refill:  1    Order Specific Question:   Supervising Provider    Answer:   Carlota Raspberry, JEFFREY R [[id number]]   metoprolol succinate (TOPROL-XL) 25 MG 24 hr tablet    Sig: Take 1 tablet (25 mg total) by mouth daily.    Dispense:  90 tablet    Refill:  1    Order Specific Question:   Supervising Provider    Answer:   Carlota Raspberry, JEFFREY R [[id number]]   potassium chloride SA (KLOR-CON M20) 20 MEQ tablet    Sig: TAKE 1 TABLET BY MOUTH EVERY DAY    Dispense:  90 tablet    Refill:  0    Order Specific Question:   Supervising Provider    Answer:   Carlota Raspberry, JEFFREY R [[id number]]   triamcinolone ointment (KENALOG) 0.5 %    Sig: APPLY TO AFFECTED AREA TWICE A DAY    Dispense:  30 g    Refill:  0    Order Specific Question:   Supervising Provider    Answer:   Carlota Raspberry, JEFFREY R [[id number]]    Return if symptoms worsen or fail to improve.   PLAN Refill meds Labs collected. Will follow up with the patient as warranted. Unclear etiology to chest pain. Will refer to cardiology for further work up.   Maximiano Coss, NP

## 2022-05-14 NOTE — Patient Instructions (Signed)
Mr. Haneef Hallquist to see you!  If they don't replace me - I recommend these providers:  Inda Coke, PA Dimas Chyle, MD Agustina Caroli, MD Myrna Blazer Early, NP Jeralyn Ruths, DNP  I have refilled meds  Cardiology will call to schedule  Thank you!  Rich

## 2022-06-03 ENCOUNTER — Ambulatory Visit (AMBULATORY_SURGERY_CENTER): Payer: Self-pay

## 2022-06-03 VITALS — Ht >= 80 in | Wt 277.0 lb

## 2022-06-03 DIAGNOSIS — Z86010 Personal history of colonic polyps: Secondary | ICD-10-CM

## 2022-06-03 MED ORDER — PEG 3350-KCL-NA BICARB-NACL 420 G PO SOLR: 4000.0000 mL | Freq: Once | ORAL | 0 refills | Status: AC

## 2022-06-03 NOTE — Progress Notes (Signed)
No egg or soy allergy known to patient  No issues known to pt with past sedation with any surgeries or procedures Patient denies ever being told they had issues or difficulty with intubation  No FH of Malignant Hyperthermia Pt is not on diet pills Pt is not on home 02  Pt is not on blood thinners  Pt denies issues with constipation  No A fib or A flutter Have any cardiac testing pending--NO Pt instructed to use [email or web address] or GoodRx for a price reduction on prep   

## 2022-06-10 ENCOUNTER — Encounter: Payer: Self-pay | Admitting: Gastroenterology

## 2022-06-11 ENCOUNTER — Ambulatory Visit (HOSPITAL_BASED_OUTPATIENT_CLINIC_OR_DEPARTMENT_OTHER): Payer: Medicare Other | Admitting: Cardiovascular Disease

## 2022-06-11 NOTE — Progress Notes (Deleted)
Cardiology Office Note   Date:  06/11/2022   ID:  Zak, Gondek [birth year], MRN [id number]  PCP:  Maximiano Coss, NP  Cardiologist:   Skeet Latch, MD   No chief complaint on file.     History of Present Illness: Wayne Rhodes is a 66 y.o. male with hypertension, hyperlipidemia, and GERD who is being seen today for the evaluation of chest pain at the request of Maximiano Coss, NP.  Mr. Willis saw Maximiano Coss, NP 05/14/2022 and reported chest heaviness.  It occurred when lying down.  He was referred to cardiology for further evaluation.     Past Medical History:  Diagnosis Date   Anxiety    takes xanax for anxiety   Arthritis    Cancer ([place])    melanoma on nose    Cataract    bilateral sx   GERD (gastroesophageal reflux disease)    Hyperlipidemia    on meds   Hypertension    on meds   Pneumonia    had pneumonia 4-5 months ago   Recurrent upper respiratory infection (URI)    recent chest cold - treated with mucinex and cough medicine - now improved    Past Surgical History:  Procedure Laterality Date   ANTERIOR CERVICAL DECOMP/DISCECTOMY FUSION  09/25/2011   Procedure: ANTERIOR CERVICAL DECOMPRESSION/DISCECTOMY FUSION 1 LEVEL;  Surgeon: Dahlia Bailiff;  Location: [facility];  Service: Orthopedics;  Laterality: N/A;  ACDF Cervical 5-6   CATARACT EXTRACTION, BILATERAL Bilateral    COLONOSCOPY  2020   GM-MAC-goly(good)-tics/hems/SSP/TA   ELBOW SURGERY Bilateral    KNEE ARTHROSCOPY Left    POLYPECTOMY  2020   SSP/TA   SHOULDER ARTHROSCOPY Left      Current Outpatient Medications  Medication Sig Dispense Refill   ALPRAZolam (XANAX) 1 MG tablet Take 1 tablet by mouth twice daily as needed. 60 tablet 1   atorvastatin (LIPITOR) 10 MG tablet Take 1 tablet (10 mg total) by mouth daily. 90 tablet 1   EPINEPHrine (EPI-PEN) 0.3 mg/0.3 mL DEVI Inject 0.3 mg into the muscle as needed. For anaphylaxis     L-Methylfolate 15 MG TABS Take 1 tablet by mouth daily.      metoprolol succinate (TOPROL-XL) 25 MG 24 hr tablet Take 1 tablet (25 mg total) by mouth daily. 90 tablet 1   oxyCODONE (ROXICODONE) 15 MG immediate release tablet Take 15 mg by mouth every 6 (six) hours as needed (Dr. Nelva Bush).      potassium chloride SA (KLOR-CON M20) 20 MEQ tablet TAKE 1 TABLET BY MOUTH EVERY DAY 90 tablet 0   triamcinolone ointment (KENALOG) 0.5 % APPLY TO AFFECTED AREA TWICE A DAY 30 g 0   No current facility-administered medications for this visit.    Allergies:   Clonazepam, Quinapril, and Sertraline hcl    Social History:  The patient  reports that he has never smoked. He has never used smokeless tobacco. He reports current alcohol use of about 1.0 - 2.0 standard drink of alcohol per week. He reports that he does not use drugs.   Family History:  The patient's ***family history includes Bladder Cancer in his father; Prostate cancer in his brother.    ROS:  Please see the history of present illness.   Otherwise, review of systems are positive for {NONE DEFAULTED:18576}.   All other systems are reviewed and negative.    PHYSICAL EXAM: VS:  There were no vitals taken for this visit. , BMI There is no  height or weight on file to calculate BMI. GENERAL:  Well appearing HEENT:  Pupils equal round and reactive, fundi not visualized, oral mucosa unremarkable NECK:  No jugular venous distention, waveform within normal limits, carotid upstroke brisk and symmetric, no bruits, no thyromegaly LYMPHATICS:  No cervical adenopathy LUNGS:  Clear to auscultation bilaterally HEART:  RRR.  PMI not displaced or sustained,S1 and S2 within normal limits, no S3, no S4, no clicks, no rubs, *** murmurs ABD:  Flat, positive bowel sounds normal in frequency in pitch, no bruits, no rebound, no guarding, no midline pulsatile mass, no hepatomegaly, no splenomegaly EXT:  2 plus pulses throughout, no edema, no cyanosis no clubbing SKIN:  No rashes no nodules NEURO:  Cranial nerves II through  XII grossly intact, motor grossly intact throughout PSYCH:  Cognitively intact, oriented to person place and time    EKG:  EKG {ACTION; IS/IS XTA:56979480} ordered today. The ekg ordered today demonstrates ***   Recent Labs: 05/14/2022: ALT 19; BUN 13; Creatinine, Ser 1.13; Hemoglobin 17.3; Platelets 193.0; Potassium 4.4; Sodium 139    Lipid Panel    Component Value Date/Time   CHOL 125 05/14/2022 1226   TRIG 225.0 (H) 05/14/2022 1226   HDL 26.70 (L) 05/14/2022 1226   CHOLHDL 5 05/14/2022 1226   VLDL 45.0 (H) 05/14/2022 1226   LDLCALC 82 04/06/2020 0928   LDLDIRECT 65.0 05/14/2022 1226      Wt Readings from Last 3 Encounters:  06/03/22 277 lb (125.6 kg)  05/14/22 273 lb 4.8 oz (124 kg)  02/12/22 274 lb 3.2 oz (124.4 kg)      ASSESSMENT AND PLAN:  ***   Current medicines are reviewed at length with the patient today.  The patient {ACTIONS; HAS/DOES NOT HAVE:19233} concerns regarding medicines.  The following changes have been made:  {PLAN; NO CHANGE:13088:s}  Labs/ tests ordered today include: *** No orders of the defined types were placed in this encounter.    Disposition:   FU with ***     Signed, Ulonda Klosowski C. Oval Linsey, MD, [facility]  06/11/2022 5:25 AM    [place]

## 2022-06-17 ENCOUNTER — Encounter: Payer: Medicare Other | Admitting: Gastroenterology

## 2022-06-25 ENCOUNTER — Encounter: Payer: Self-pay | Admitting: Gastroenterology

## 2022-06-25 ENCOUNTER — Ambulatory Visit (AMBULATORY_SURGERY_CENTER): Payer: Medicare Other | Admitting: Gastroenterology

## 2022-06-25 VITALS — BP 143/86 | HR 83 | Temp 98.7°F | Resp 16 | Ht 76.0 in | Wt 270.0 lb

## 2022-06-25 DIAGNOSIS — Z09 Encounter for follow-up examination after completed treatment for conditions other than malignant neoplasm: Secondary | ICD-10-CM

## 2022-06-25 DIAGNOSIS — D122 Benign neoplasm of ascending colon: Secondary | ICD-10-CM

## 2022-06-25 DIAGNOSIS — D120 Benign neoplasm of cecum: Secondary | ICD-10-CM

## 2022-06-25 DIAGNOSIS — Z86010 Personal history of colonic polyps: Secondary | ICD-10-CM | POA: Diagnosis not present

## 2022-06-25 MED ORDER — SODIUM CHLORIDE 0.9 % IV SOLN: 500.0000 mL | INTRAVENOUS | Status: DC

## 2022-06-25 NOTE — Patient Instructions (Signed)
Eat high fiber diet Use FiberCon 1-2 tabs PO daily   YOU HAD AN ENDOSCOPIC PROCEDURE TODAY: Refer to the procedure report and other information in the discharge instructions given to you for any specific questions about what was found during the examination. If this information does not answer your questions, please call [place] office at [phone] to clarify.   YOU SHOULD EXPECT: Some feelings of bloating in the abdomen. Passage of more gas than usual. Walking can help get rid of the air that was put into your GI tract during the procedure and reduce the bloating. If you had a lower endoscopy (such as a colonoscopy or flexible sigmoidoscopy) you may notice spotting of blood in your stool or on the toilet paper. Some abdominal soreness may be present for a day or two, also.  DIET: Your first meal following the procedure should be a light meal and then it is ok to progress to your normal diet. A half-sandwich or bowl of soup is an example of a good first meal. Heavy or fried foods are harder to digest and may make you feel nauseous or bloated. Drink plenty of fluids but you should avoid alcoholic beverages for 24 hours. If you had a esophageal dilation, please see attached instructions for diet.    ACTIVITY: Your care partner should take you home directly after the procedure. You should plan to take it easy, moving slowly for the rest of the day. You can resume normal activity the day after the procedure however YOU SHOULD NOT DRIVE, use power tools, machinery or perform tasks that involve climbing or major physical exertion for 24 hours (because of the sedation medicines used during the test).   SYMPTOMS TO REPORT IMMEDIATELY: A gastroenterologist can be reached at any hour. Please call [phone]  for any of the following symptoms:  Following lower endoscopy (colonoscopy, flexible sigmoidoscopy) Excessive amounts of blood in the stool  Significant tenderness, worsening of abdominal pains   Swelling of the abdomen that is new, acute  Fever of 100 or higher  FOLLOW UP:  If any biopsies were taken you will be contacted by phone or by letter within the next 1-3 weeks. Call [phone]  if you have not heard about the biopsies in 3 weeks.  Please also call with any specific questions about appointments or follow up tests.

## 2022-06-25 NOTE — Progress Notes (Signed)
GASTROENTEROLOGY PROCEDURE H&P NOTE   Primary Care Physician: Maximiano Coss, NP  HPI: Wayne Rhodes is a 66 y.o. male who presents for Colonoscopy for surveillance of previous TAs and SSPs.  Past Medical History:  Diagnosis Date   Anxiety    takes xanax for anxiety   Arthritis    Cancer ([place])    melanoma on nose    Cataract    bilateral sx   GERD (gastroesophageal reflux disease)    Hyperlipidemia    on meds   Hypertension    on meds   Pneumonia    had pneumonia 4-5 months ago   Recurrent upper respiratory infection (URI)    recent chest cold - treated with mucinex and cough medicine - now improved   Past Surgical History:  Procedure Laterality Date   ANTERIOR CERVICAL DECOMP/DISCECTOMY FUSION  09/25/2011   Procedure: ANTERIOR CERVICAL DECOMPRESSION/DISCECTOMY FUSION 1 LEVEL;  Surgeon: Dahlia Bailiff;  Location: [place];  Service: Orthopedics;  Laterality: N/A;  ACDF Cervical 5-6   CATARACT EXTRACTION, BILATERAL Bilateral    COLONOSCOPY  2020   GM-MAC-goly(good)-tics/hems/SSP/TA   ELBOW SURGERY Bilateral    KNEE ARTHROSCOPY Left    POLYPECTOMY  2020   SSP/TA   SHOULDER ARTHROSCOPY Left    Current Outpatient Medications  Medication Sig Dispense Refill   ALPRAZolam (XANAX) 1 MG tablet Take 1 tablet by mouth twice daily as needed. 60 tablet 1   atorvastatin (LIPITOR) 10 MG tablet Take 1 tablet (10 mg total) by mouth daily. 90 tablet 1   metoprolol succinate (TOPROL-XL) 25 MG 24 hr tablet Take 1 tablet (25 mg total) by mouth daily. 90 tablet 1   potassium chloride SA (KLOR-CON M20) 20 MEQ tablet TAKE 1 TABLET BY MOUTH EVERY DAY 90 tablet 0   EPINEPHrine (EPI-PEN) 0.3 mg/0.3 mL DEVI Inject 0.3 mg into the muscle as needed. For anaphylaxis     L-Methylfolate 15 MG TABS Take 1 tablet by mouth daily.     oxyCODONE (ROXICODONE) 15 MG immediate release tablet Take 15 mg by mouth every 6 (six) hours as needed (Dr. Nelva Bush).      triamcinolone ointment (KENALOG) 0.5 % APPLY  TO AFFECTED AREA TWICE A DAY (Patient not taking: Reported on 06/25/2022) 30 g 0   Current Facility-Administered Medications  Medication Dose Route Frequency Provider Last Rate Last Admin   0.9 %  sodium chloride infusion  500 mL Intravenous Continuous Mansouraty, Telford Nab., MD        Current Outpatient Medications:    ALPRAZolam (XANAX) 1 MG tablet, Take 1 tablet by mouth twice daily as needed., Disp: 60 tablet, Rfl: 1   atorvastatin (LIPITOR) 10 MG tablet, Take 1 tablet (10 mg total) by mouth daily., Disp: 90 tablet, Rfl: 1   metoprolol succinate (TOPROL-XL) 25 MG 24 hr tablet, Take 1 tablet (25 mg total) by mouth daily., Disp: 90 tablet, Rfl: 1   potassium chloride SA (KLOR-CON M20) 20 MEQ tablet, TAKE 1 TABLET BY MOUTH EVERY DAY, Disp: 90 tablet, Rfl: 0   EPINEPHrine (EPI-PEN) 0.3 mg/0.3 mL DEVI, Inject 0.3 mg into the muscle as needed. For anaphylaxis, Disp: , Rfl:    L-Methylfolate 15 MG TABS, Take 1 tablet by mouth daily., Disp: , Rfl:    oxyCODONE (ROXICODONE) 15 MG immediate release tablet, Take 15 mg by mouth every 6 (six) hours as needed (Dr. Nelva Bush). , Disp: , Rfl:    triamcinolone ointment (KENALOG) 0.5 %, APPLY TO AFFECTED AREA TWICE A DAY (  Patient not taking: Reported on 06/25/2022), Disp: 30 g, Rfl: 0  Current Facility-Administered Medications:    0.9 %  sodium chloride infusion, 500 mL, Intravenous, Continuous, Mansouraty, Telford Nab., MD Allergies  Allergen Reactions   Clonazepam     Angioedema    Quinapril     Angioedema    Sertraline Hcl Other (See Comments)    Suicidal thoughts   Family History  Problem Relation Age of Onset   Bladder Cancer Father    Prostate cancer Brother    Colon cancer Neg Hx    Colon polyps Neg Hx    Esophageal cancer Neg Hx    Rectal cancer Neg Hx    Stomach cancer Neg Hx    Social History   Socioeconomic History   Marital status: Single    Spouse name: Not on file   Number of children: 2   Years of education: Not on file    Highest education level: Not on file  Occupational History   Occupation: Retired    Comment: Service Department  Tobacco Use   Smoking status: Never   Smokeless tobacco: Never  Vaping Use   Vaping Use: Never used  Substance and Sexual Activity   Alcohol use: Yes    Alcohol/week: 1.0 - 2.0 standard drink of alcohol    Types: 1 - 2 Standard drinks or equivalent per week    Comment: occasional beer   Drug use: No   Sexual activity: Yes  Other Topics Concern   Not on file  Social History Narrative   Not on file   Social Determinants of Health   Financial Resource Strain: Low Risk  (10/10/2021)   Overall Financial Resource Strain (CARDIA)    Difficulty of Paying Living Expenses: Not hard at all  Food Insecurity: No Food Insecurity (10/10/2021)   Hunger Vital Sign    Worried About Running Out of Food in the Last Year: Never true    Ran Out of Food in the Last Year: Never true  Transportation Needs: No Transportation Needs (10/10/2021)   PRAPARE - Hydrologist (Medical): No    Lack of Transportation (Non-Medical): No  Physical Activity: Insufficiently Active (10/10/2021)   Exercise Vital Sign    Days of Exercise per Week: 2 days    Minutes of Exercise per Session: 30 min  Stress: No Stress Concern Present (10/10/2021)   [place]    Feeling of Stress : Not at all  Social Connections: Socially Isolated (10/10/2021)   Social Connection and Isolation Panel [NHANES]    Frequency of Communication with Friends and Family: Twice a week    Frequency of Social Gatherings with Friends and Family: Twice a week    Attends Religious Services: Never    Marine scientist or Organizations: No    Attends Archivist Meetings: Never    Marital Status: Divorced  Human resources officer Violence: Not At Risk (10/10/2021)   Humiliation, Afraid, Rape, and Kick questionnaire    Fear of Current  or Ex-Partner: No    Emotionally Abused: No    Physically Abused: No    Sexually Abused: No    Physical Exam: Today's Vitals   06/25/22 0837 06/25/22 0842  BP: 131/79   Pulse: (!) 106   Temp: 98.7 F (37.1 C) 98.7 F (37.1 C)  SpO2: 95%   Weight: 270 lb (122.5 kg)   Height: _0  (1.93 m)  Body mass index is 32.87 kg/m. GEN: NAD EYE: Sclerae anicteric ENT: MMM CV: Non-tachycardic GI: Soft, NT/ND NEURO:  Alert & Oriented x 3  Lab Results: No results for input(s): "WBC", "HGB", "HCT", "PLT" in the last 72 hours. BMET No results for input(s): "NA", "K", "CL", "CO2", "GLUCOSE", "BUN", "CREATININE", "CALCIUM" in the last 72 hours. LFT No results for input(s): "PROT", "ALBUMIN", "AST", "ALT", "ALKPHOS", "BILITOT", "BILIDIR", "IBILI" in the last 72 hours. PT/INR No results for input(s): "LABPROT", "INR" in the last 72 hours.   Impression / Plan: This is a 66 y.o.male who presents for Colonoscopy for surveillance of previous TAs and SSPs.  The risks and benefits of endoscopic evaluation/treatment were discussed with the patient and/or family; these include but are not limited to the risk of perforation, infection, bleeding, missed lesions, lack of diagnosis, severe illness requiring hospitalization, as well as anesthesia and sedation related illnesses.  The patient's history has been reviewed, patient examined, no change in status, and deemed stable for procedure.  The patient and/or family is agreeable to proceed.    Justice Britain, MD [facility]oscopy Office # [phone]

## 2022-06-25 NOTE — Progress Notes (Signed)
Patient states no changes to his medical/surgical history since pre-visit. SChaplin, RN,BSN

## 2022-06-25 NOTE — Progress Notes (Signed)
Called to room to assist during endoscopic procedure.  Patient ID and intended procedure confirmed with present staff. Received instructions for my participation in the procedure from the performing physician.  

## 2022-06-25 NOTE — Op Note (Signed)
[place] Patient Name: Wayne Rhodes Procedure Date: 06/25/2022 9:27 AM MRN: [id number] Endoscopist: Justice Britain , MD Age: 66 Referring MD:  Date of Birth: 05/13/1956 Gender: Male Account #: [id number] Procedure:                Colonoscopy Indications:              Surveillance: Personal history of adenomatous                            polyps on last colonoscopy 3 years ago Medicines:                Monitored Anesthesia Care Procedure:                Pre-Anesthesia Assessment:                           - Prior to the procedure, a History and Physical                            was performed, and patient medications and                            allergies were reviewed. The patient's tolerance of                            previous anesthesia was also reviewed. The risks                            and benefits of the procedure and the sedation                            options and risks were discussed with the patient.                            All questions were answered, and informed consent                            was obtained. Prior Anticoagulants: The patient has                            taken no previous anticoagulant or antiplatelet                            agents. ASA Grade Assessment: II - A patient with                            mild systemic disease. After reviewing the risks                            and benefits, the patient was deemed in                            satisfactory condition to undergo the procedure.  After obtaining informed consent, the colonoscope                            was passed under direct vision. Throughout the                            procedure, the patient's blood pressure, pulse, and                            oxygen saturations were monitored continuously. The                            Colonoscope was introduced through the anus and                            advanced to the the cecum,  identified by                            appendiceal orifice and ileocecal valve. The                            colonoscopy was performed without difficulty. The                            patient tolerated the procedure. The quality of the                            bowel preparation was good. The ileocecal valve,                            appendiceal orifice, and rectum were photographed. Scope In: 9:41:45 AM Scope Out: 9:56:39 AM Scope Withdrawal Time: 0 hours 11 minutes 3 seconds  Total Procedure Duration: 0 hours 14 minutes 54 seconds  Findings:                 The digital rectal exam findings include                            hemorrhoids. Pertinent negatives include no                            palpable rectal lesions.                           Three sessile polyps were found in the ascending                            colon (2) and cecum (1). The polyps were 2 to 5 mm                            in size. These polyps were removed with a cold                            snare. Resection and retrieval were complete.  Multiple small-mouthed diverticula were found in                            the entire colon.                           Normal mucosa was found in the entire colon                            otherwise.                           Non-bleeding non-thrombosed external and internal                            hemorrhoids were found during retroflexion, during                            perianal exam and during digital exam. The                            hemorrhoids were Grade II (internal hemorrhoids                            that prolapse but reduce spontaneously). Complications:            No immediate complications. Estimated Blood Loss:     Estimated blood loss was minimal. Impression:               - Hemorrhoids found on digital rectal exam.                           - Three 2 to 5 mm polyps in the ascending colon and                             in the cecum, removed with a cold snare. Resected                            and retrieved.                           - Diverticulosis in the entire examined colon.                           - Normal mucosa in the entire examined colon                            otherwise.                           - Non-bleeding non-thrombosed external and internal                            hemorrhoids. Recommendation:           - The patient will be observed post-procedure,  until all discharge criteria are met.                           - Discharge patient to home.                           - Patient has a contact number available for                            emergencies. The signs and symptoms of potential                            delayed complications were discussed with the                            patient. Return to normal activities tomorrow.                            Written discharge instructions were provided to the                            patient.                           - High fiber diet.                           - Use FiberCon 1-2 tablets PO daily.                           - Continue present medications.                           - Await pathology results.                           - Repeat colonoscopy in 3/5/7 years for                            surveillance based on pathology results.                           - The findings and recommendations were discussed                            with the patient.                           - The findings and recommendations were discussed                            with the designated responsible adult. Justice Britain, MD 06/25/2022 10:01:12 AM

## 2022-06-25 NOTE — Progress Notes (Signed)
Report to PACU, RN, vss, BBS= Clear.  

## 2022-06-26 ENCOUNTER — Telehealth: Payer: Self-pay

## 2022-06-26 NOTE — Telephone Encounter (Signed)
No answer, left message to call if having any issues or concerns, B.Icelyn Navarrete RN 

## 2022-06-28 ENCOUNTER — Encounter: Payer: Self-pay | Admitting: Gastroenterology

## 2022-07-15 ENCOUNTER — Telehealth: Payer: Self-pay | Admitting: Registered Nurse

## 2022-07-15 DIAGNOSIS — F419 Anxiety disorder, unspecified: Secondary | ICD-10-CM

## 2022-07-15 NOTE — Telephone Encounter (Signed)
Encourage patient to contact the pharmacy for refills or they can request refills through [facility]  (Please schedule appointment if patient has not been seen in over a year)    WHAT [place] TO:  [facility] [address] [phone]  MEDICATION NAME & DOSE: alprazolam 1 mg   NOTES/COMMENTS FROM PATIENT: pt needs refill to get him through until he can establish new pcp Wayne Rhodes at [place] location on 07/24/22      Front office please notify patient: It takes 48-72 hours to process rx refill requests Ask patient to call pharmacy to ensure rx is ready before heading there.

## 2022-07-16 MED ORDER — ALPRAZOLAM 1 MG PO TABS: ORAL_TABLET | ORAL | 0 refills | Status: DC

## 2022-07-16 NOTE — Telephone Encounter (Signed)
Prescription sent to pharmacy.

## 2022-07-24 ENCOUNTER — Ambulatory Visit (INDEPENDENT_AMBULATORY_CARE_PROVIDER_SITE_OTHER): Payer: Medicare Other | Admitting: Adult Health

## 2022-07-24 ENCOUNTER — Encounter: Payer: Self-pay | Admitting: Adult Health

## 2022-07-24 VITALS — BP 110/80 | HR 75 | Temp 98.3°F | Ht 75.0 in | Wt 272.0 lb

## 2022-07-24 DIAGNOSIS — E785 Hyperlipidemia, unspecified: Secondary | ICD-10-CM

## 2022-07-24 DIAGNOSIS — M545 Low back pain, unspecified: Secondary | ICD-10-CM

## 2022-07-24 DIAGNOSIS — F419 Anxiety disorder, unspecified: Secondary | ICD-10-CM

## 2022-07-24 DIAGNOSIS — I10 Essential (primary) hypertension: Secondary | ICD-10-CM

## 2022-07-24 DIAGNOSIS — N529 Male erectile dysfunction, unspecified: Secondary | ICD-10-CM

## 2022-07-24 DIAGNOSIS — G8929 Other chronic pain: Secondary | ICD-10-CM

## 2022-07-24 DIAGNOSIS — Z7689 Persons encountering health services in other specified circumstances: Secondary | ICD-10-CM

## 2022-07-24 MED ORDER — SILDENAFIL CITRATE 50 MG PO TABS: 25.0000 mg | ORAL_TABLET | Freq: Every day | ORAL | 3 refills | Status: DC | PRN

## 2022-07-24 NOTE — Progress Notes (Signed)
Patient presents to clinic today to establish care. He is a pleasant 66 year old male who  has a past medical history of Anxiety, Arthritis, Cancer ([place]), Cataract, GERD (gastroesophageal reflux disease), Hyperlipidemia, Hypertension, Pneumonia, and Recurrent upper respiratory infection (URI).  He is a former patient of Maximiano Coss NP   His last CPE was in 05/2022  Acute Concerns: Establish Care   ED - Reports that he has had ED for the last 3-4 years. He has trouble getting an erection and maintaining an erection.  He has bought something online ( unsure of what it is) but he reports that it does not work. Has also used viagra in the past but was not able to afford it any longer - this worked well for him. He is wondering if he can get a new prescription for it   Chronic Issues: Lumbar Radiculopathy-managed by orthopedics, he does see Dr. Nelva Bush for lumbar injections every 3-4 months. Uses oxycodone 15 mg every 6 hours as needed. He reports that his pain is managed well.   Hyperlipidemia-managed with Lipitor 10 mg daily.  He denies myalgia or fatigue Lab Results  Component Value Date   CHOL 125 05/14/2022   HDL 26.70 (L) 05/14/2022   LDLCALC 82 04/06/2020   LDLDIRECT 65.0 05/14/2022   TRIG 225.0 (H) 05/14/2022   CHOLHDL 5 05/14/2022   Anxiety-managed with Xanax 1 mg twice daily. Feels well controlled on that medication.   Hypertension-managed with Toprol 25 mg daily.  He denies dizziness, lightheadedness, chest pain, or shortness of breath.  BP Readings from Last 3 Encounters:  07/24/22 110/80  06/25/22 (!) 143/86  05/14/22 128/78   Health Maintenance: Dental -- Routine Care  Vision --  Routine Care  Immunizations -- Refuses all vaccinations.  Colonoscopy -- last was in 06/2022 - has three polyps.     Past Medical History:  Diagnosis Date   Anxiety    takes xanax for anxiety   Arthritis    Cancer ([place])    melanoma on nose    Cataract    bilateral sx   GERD  (gastroesophageal reflux disease)    Hyperlipidemia    on meds   Hypertension    on meds   Pneumonia    had pneumonia 4-5 months ago   Recurrent upper respiratory infection (URI)    recent chest cold - treated with mucinex and cough medicine - now improved    Past Surgical History:  Procedure Laterality Date   ANTERIOR CERVICAL DECOMP/DISCECTOMY FUSION  09/25/2011   Procedure: ANTERIOR CERVICAL DECOMPRESSION/DISCECTOMY FUSION 1 LEVEL;  Surgeon: Dahlia Bailiff;  Location: [place];  Service: Orthopedics;  Laterality: N/A;  ACDF Cervical 5-6   CATARACT EXTRACTION, BILATERAL Bilateral    COLONOSCOPY  2020   GM-MAC-goly(good)-tics/hems/SSP/TA   ELBOW SURGERY Bilateral    KNEE ARTHROSCOPY Left    POLYPECTOMY  2020   SSP/TA   SHOULDER ARTHROSCOPY Left     Current Outpatient Medications on File Prior to Visit  Medication Sig Dispense Refill   ALPRAZolam (XANAX) 1 MG tablet Take 1 tablet by mouth twice daily as needed. 60 tablet 0   atorvastatin (LIPITOR) 10 MG tablet Take 1 tablet (10 mg total) by mouth daily. 90 tablet 1   Calcium Polycarbophil (FIBER-CAPS PO) Take by mouth.     EPINEPHrine (EPI-PEN) 0.3 mg/0.3 mL DEVI Inject 0.3 mg into the muscle as needed. For anaphylaxis     L-Methylfolate 15 MG TABS Take 1 tablet by mouth  daily.     metoprolol succinate (TOPROL-XL) 25 MG 24 hr tablet Take 1 tablet (25 mg total) by mouth daily. 90 tablet 1   oxyCODONE (ROXICODONE) 15 MG immediate release tablet Take 15 mg by mouth every 6 (six) hours as needed (Dr. Nelva Bush).      potassium chloride SA (KLOR-CON M20) 20 MEQ tablet TAKE 1 TABLET BY MOUTH EVERY DAY 90 tablet 0   triamcinolone ointment (KENALOG) 0.5 % APPLY TO AFFECTED AREA TWICE A DAY 30 g 0   [DISCONTINUED] buPROPion (WELLBUTRIN XL) 150 MG 24 hr tablet Take 150 mg by mouth daily.     [DISCONTINUED] diphenhydrAMINE (BENADRYL) 25 MG tablet Take 25 mg by mouth 2 (two) times daily as needed. For swelling     No current  facility-administered medications on file prior to visit.    Allergies  Allergen Reactions   Clonazepam     Angioedema    Quinapril     Angioedema    Sertraline Hcl Other (See Comments)    Suicidal thoughts    Family History  Problem Relation Age of Onset   Bladder Cancer Father    Prostate cancer Brother    Colon cancer Neg Hx    Colon polyps Neg Hx    Esophageal cancer Neg Hx    Rectal cancer Neg Hx    Stomach cancer Neg Hx     Social History   Socioeconomic History   Marital status: Single    Spouse name: Not on file   Number of children: 2   Years of education: Not on file   Highest education level: Not on file  Occupational History   Occupation: Retired    Comment: Service Department  Tobacco Use   Smoking status: Never   Smokeless tobacco: Never  Vaping Use   Vaping Use: Never used  Substance and Sexual Activity   Alcohol use: Yes    Alcohol/week: 1.0 - 2.0 standard drink of alcohol    Types: 1 - 2 Standard drinks or equivalent per week    Comment: occasional beer   Drug use: No   Sexual activity: Yes  Other Topics Concern   Not on file  Social History Narrative   Not on file   Social Determinants of Health   Financial Resource Strain: Low Risk  (10/10/2021)   Overall Financial Resource Strain (CARDIA)    Difficulty of Paying Living Expenses: Not hard at all  Food Insecurity: No Food Insecurity (10/10/2021)   Hunger Vital Sign    Worried About Running Out of Food in the Last Year: Never true    Ran Out of Food in the Last Year: Never true  Transportation Needs: No Transportation Needs (10/10/2021)   PRAPARE - Hydrologist (Medical): No    Lack of Transportation (Non-Medical): No  Physical Activity: Insufficiently Active (10/10/2021)   Exercise Vital Sign    Days of Exercise per Week: 2 days    Minutes of Exercise per Session: 30 min  Stress: No Stress Concern Present (10/10/2021)   [place]    Feeling of Stress : Not at all  Social Connections: Socially Isolated (10/10/2021)   Social Connection and Isolation Panel [NHANES]    Frequency of Communication with Friends and Family: Twice a week    Frequency of Social Gatherings with Friends and Family: Twice a week    Attends Religious Services: Never    Retail buyer of [employer]  or Organizations: No    Attends Archivist Meetings: Never    Marital Status: Divorced  Human resources officer Violence: Not At Risk (10/10/2021)   Humiliation, Afraid, Rape, and Kick questionnaire    Fear of Current or Ex-Partner: No    Emotionally Abused: No    Physically Abused: No    Sexually Abused: No    Review of Systems  Constitutional: Negative.   HENT: Negative.    Respiratory: Negative.    Cardiovascular: Negative.   Gastrointestinal: Negative.   Genitourinary: Negative.   Musculoskeletal: Negative.   Skin: Negative.   Neurological: Negative.   Psychiatric/Behavioral:  The patient is nervous/anxious.     BP 110/80   Pulse 75   Temp 98.3 F (36.8 C) (Oral)   Ht _0  (1.905 m)   Wt 272 lb (123.4 kg)   SpO2 98%   BMI 34.00 kg/m   Physical Exam Vitals and nursing note reviewed.  Constitutional:      Appearance: Normal appearance. He is obese.  Cardiovascular:     Rate and Rhythm: Normal rate and regular rhythm.     Pulses: Normal pulses.     Heart sounds: Normal heart sounds.  Pulmonary:     Effort: Pulmonary effort is normal.     Breath sounds: Normal breath sounds.  Musculoskeletal:        General: Normal range of motion.  Skin:    General: Skin is warm and dry.     Capillary Refill: Capillary refill takes less than 2 seconds.  Neurological:     General: No focal deficit present.     Mental Status: He is alert and oriented to person, place, and time.  Psychiatric:        Mood and Affect: Mood normal.        Behavior: Behavior normal.        Thought  Content: Thought content normal.        Judgment: Judgment normal.     Recent Results (from the past 2160 hour(s))  Comprehensive metabolic panel     Status: Abnormal   Collection Time: 05/14/22 12:26 PM  Result Value Ref Range   Sodium 139 135 - 145 mEq/L   Potassium 4.4 3.5 - 5.1 mEq/L   Chloride 105 96 - 112 mEq/L   CO2 26 19 - 32 mEq/L   Glucose, Bld 126 (H) 70 - 99 mg/dL   BUN 13 6 - 23 mg/dL   Creatinine, Ser 1.13 0.40 - 1.50 mg/dL   Total Bilirubin 1.0 0.2 - 1.2 mg/dL   Alkaline Phosphatase 44 39 - 117 U/L   AST 22 0 - 37 U/L   ALT 19 0 - 53 U/L   Total Protein 7.0 6.0 - 8.3 g/dL   Albumin 4.3 3.5 - 5.2 g/dL   GFR 66.91 >60.00 mL/min    Comment: Calculated using the CKD-EPI Creatinine Equation (2021)   Calcium 9.4 8.4 - 10.5 mg/dL  CBC with Differential/Platelet     Status: Abnormal   Collection Time: 05/14/22 12:26 PM  Result Value Ref Range   WBC 5.5 4.0 - 10.5 K/uL   RBC 5.57 4.22 - 5.81 Mil/uL   Hemoglobin 17.3 (H) 13.0 - 17.0 g/dL   HCT 52.0 39.0 - 52.0 %   MCV 93.4 78.0 - 100.0 fl   MCHC 33.3 30.0 - 36.0 g/dL   RDW 14.0 11.5 - 15.5 %   Platelets 193.0 150.0 - 400.0 K/uL   Neutrophils Relative % 64.7 43.0 -  77.0 %   Lymphocytes Relative 21.0 12.0 - 46.0 %   Monocytes Relative 9.7 3.0 - 12.0 %   Eosinophils Relative 3.6 0.0 - 5.0 %   Basophils Relative 1.0 0.0 - 3.0 %   Neutro Abs 3.6 1.4 - 7.7 K/uL   Lymphs Abs 1.2 0.7 - 4.0 K/uL   Monocytes Absolute 0.5 0.1 - 1.0 K/uL   Eosinophils Absolute 0.2 0.0 - 0.7 K/uL   Basophils Absolute 0.1 0.0 - 0.1 K/uL  Lipid panel     Status: Abnormal   Collection Time: 05/14/22 12:26 PM  Result Value Ref Range   Cholesterol 125 0 - 200 mg/dL    Comment: ATP III Classification       Desirable:  < 200 mg/dL               Borderline High:  200 - 239 mg/dL          High:  > = 240 mg/dL   Triglycerides 225.0 (H) 0.0 - 149.0 mg/dL    Comment: Normal:  <150 mg/dLBorderline High:  150 - 199 mg/dL   HDL 26.70 (L) >39.00 mg/dL    VLDL 45.0 (H) 0.0 - 40.0 mg/dL   Total CHOL/HDL Ratio 5     Comment:                Men          Women1/2 Average Risk     3.4          3.3Average Risk          5.0          4.42X Average Risk          9.6          7.13X Average Risk          15.0          11.0                       NonHDL 97.91     Comment: NOTE:  Non-HDL goal should be 30 mg/dL higher than patient's LDL goal (i.e. LDL goal of < 70 mg/dL, would have non-HDL goal of < 100 mg/dL)  Hemoglobin A1c     Status: None   Collection Time: 05/14/22 12:26 PM  Result Value Ref Range   Hgb A1c MFr Bld 6.1 4.6 - 6.5 %    Comment: Glycemic Control Guidelines for People with Diabetes:Non Diabetic:  <6%Goal of Therapy: <7%Additional Action Suggested:  >8%   LDL cholesterol, direct     Status: None   Collection Time: 05/14/22 12:26 PM  Result Value Ref Range   Direct LDL 65.0 mg/dL    Comment: Optimal:  <100 mg/dLNear or Above Optimal:  100-129 mg/dLBorderline High:  130-159 mg/dLHigh:  160-189 mg/dLVery High:  >190 mg/dL    Assessment/Plan: 1. Encounter to establish care - Follow up in 05/2023 for your physical   2. Erectile dysfunction, unspecified erectile dysfunction type  - sildenafil (VIAGRA) 50 MG tablet; Take 0.5-1 tablets (25-50 mg total) by mouth daily as needed for erectile dysfunction.  Dispense: 10 tablet; Refill: 3  3. Anxiety - Continue with xanax as directed  4. Essential hypertension - Well controlled.   5. Hyperlipidemia, unspecified hyperlipidemia type - Continue with statin   6. Chronic low back pain, unspecified back pain laterality, unspecified whether sciatica present - Follow up with Dr. Nelva Bush as directed  Dorothyann Peng, NP

## 2022-07-24 NOTE — Patient Instructions (Addendum)
It was great meeting you today   Please follow up after 05/15/2023 for your physical exam   Let me know if you need anything in the meantime

## 2022-08-04 ENCOUNTER — Ambulatory Visit (HOSPITAL_BASED_OUTPATIENT_CLINIC_OR_DEPARTMENT_OTHER): Payer: Medicare Other | Admitting: Cardiovascular Disease

## 2022-08-04 NOTE — Progress Notes (Incomplete)
Cardiology Office Note:    Date:  08/04/2022   ID:  Wayne Rhodes, DOB 1956/02/13, MRN [id number]  PCP:  Dorothyann Peng, NP   [place] Providers Cardiologist:  None { Click to update primary MD,subspecialty MD or APP then REFRESH:1}    Referring MD: Maximiano Coss, NP   No chief complaint on file. ***  History of Present Illness:    Wayne Rhodes is a 66 y.o. male with a hx of ***  Past Medical History:  Diagnosis Date   Anxiety    takes xanax for anxiety   Arthritis    Cancer ([place])    melanoma on nose    Cataract    bilateral sx   GERD (gastroesophageal reflux disease)    Hyperlipidemia    on meds   Hypertension    on meds   Pneumonia    had pneumonia 4-5 months ago   Recurrent upper respiratory infection (URI)    recent chest cold - treated with mucinex and cough medicine - now improved    Past Surgical History:  Procedure Laterality Date   ANTERIOR CERVICAL DECOMP/DISCECTOMY FUSION  09/25/2011   Procedure: ANTERIOR CERVICAL DECOMPRESSION/DISCECTOMY FUSION 1 LEVEL;  Surgeon: Dahlia Bailiff;  Location: [place];  Service: Orthopedics;  Laterality: N/A;  ACDF Cervical 5-6   CATARACT EXTRACTION, BILATERAL Bilateral    COLONOSCOPY  2020   GM-MAC-goly(good)-tics/hems/SSP/TA   ELBOW SURGERY Bilateral    KNEE ARTHROSCOPY Left    POLYPECTOMY  2020   SSP/TA   SHOULDER ARTHROSCOPY Left     Current Medications: No outpatient medications have been marked as taking for the 08/04/22 encounter (Appointment) with Skeet Latch, MD.     Allergies:   Clonazepam, Quinapril, and Sertraline hcl   Social History   Socioeconomic History   Marital status: Single    Spouse name: Not on file   Number of children: 2   Years of education: Not on file   Highest education level: Not on file  Occupational History   Occupation: Retired    Comment: Service Department  Tobacco Use   Smoking status: Never   Smokeless tobacco: Never  Vaping Use   Vaping  Use: Never used  Substance and Sexual Activity   Alcohol use: Yes    Alcohol/week: 1.0 - 2.0 standard drink of alcohol    Types: 1 - 2 Standard drinks or equivalent per week    Comment: occasional beer   Drug use: No   Sexual activity: Yes  Other Topics Concern   Not on file  Social History Narrative   Not on file   Social Determinants of Health   Financial Resource Strain: Low Risk  (10/10/2021)   Overall Financial Resource Strain (CARDIA)    Difficulty of Paying Living Expenses: Not hard at all  Food Insecurity: No Food Insecurity (10/10/2021)   Hunger Vital Sign    Worried About Running Out of Food in the Last Year: Never true    Ran Out of Food in the Last Year: Never true  Transportation Needs: No Transportation Needs (10/10/2021)   PRAPARE - Hydrologist (Medical): No    Lack of Transportation (Non-Medical): No  Physical Activity: Insufficiently Active (10/10/2021)   Exercise Vital Sign    Days of Exercise per Week: 2 days    Minutes of Exercise per Session: 30 min  Stress: No Stress Concern Present (10/10/2021)   [place]  Feeling of Stress : Not at all  Social Connections: Socially Isolated (10/10/2021)   Social Connection and Isolation Panel [NHANES]    Frequency of Communication with Friends and Family: Twice a week    Frequency of Social Gatherings with Friends and Family: Twice a week    Attends Religious Services: Never    Marine scientist or Organizations: No    Attends Music therapist: Never    Marital Status: Divorced     Family History: The patient's ***family history includes Bladder Cancer in his father; Prostate cancer in his brother. There is no history of Colon cancer, Colon polyps, Esophageal cancer, Rectal cancer, or Stomach cancer.  ROS:   Please see the history of present illness.    *** All other systems reviewed and are  negative.  EKGs/Labs/Other Studies Reviewed:    The following studies were reviewed today: ***  EKG:  EKG is *** ordered today.  The ekg ordered today demonstrates ***  Recent Labs: 05/14/2022: ALT 19; BUN 13; Creatinine, Ser 1.13; Hemoglobin 17.3; Platelets 193.0; Potassium 4.4; Sodium 139  Recent Lipid Panel    Component Value Date/Time   CHOL 125 05/14/2022 1226   TRIG 225.0 (H) 05/14/2022 1226   HDL 26.70 (L) 05/14/2022 1226   CHOLHDL 5 05/14/2022 1226   VLDL 45.0 (H) 05/14/2022 1226   LDLCALC 82 04/06/2020 0928   LDLDIRECT 65.0 05/14/2022 1226     Risk Assessment/Calculations:   {Does this patient have ATRIAL FIBRILLATION?[phone]}  No BP recorded.  {Refresh Note OR Click here to enter BP  :1}***         Physical Exam:    VS:  There were no vitals taken for this visit.    Wt Readings from Last 3 Encounters:  07/24/22 272 lb (123.4 kg)  06/25/22 270 lb (122.5 kg)  06/03/22 277 lb (125.6 kg)     GEN: *** Well nourished, well developed in no acute distress HEENT: Normal NECK: No JVD; No carotid bruits LYMPHATICS: No lymphadenopathy CARDIAC: ***RRR, no murmurs, rubs, gallops RESPIRATORY:  Clear to auscultation without rales, wheezing or rhonchi  ABDOMEN: Soft, non-tender, non-distended MUSCULOSKELETAL:  No edema; No deformity  SKIN: Warm and dry NEUROLOGIC:  Alert and oriented x 3 PSYCHIATRIC:  Normal affect   ASSESSMENT:    No diagnosis found. PLAN:    In order of problems listed above:  ***      {Are you ordering a CV Procedure (e.g. stress test, cath, DCCV, TEE, etc)?   Press F2        :[id number]}    Medication Adjustments/Labs and Tests Ordered: Current medicines are reviewed at length with the patient today.  Concerns regarding medicines are outlined above.  No orders of the defined types were placed in this encounter.  No orders of the defined types were placed in this encounter.   There are no Patient Instructions on file for this  visit.    I,Alexis Herring,acting as a Education administrator for Skeet Latch, MD.,have documented all relevant documentation on the behalf of Skeet Latch, MD,as directed by  Skeet Latch, MD while in the presence of Skeet Latch, MD.  *** Gregor Hams  08/04/2022 8:38 AM    [place]

## 2022-08-19 ENCOUNTER — Other Ambulatory Visit: Payer: Self-pay | Admitting: Adult Health

## 2022-08-19 ENCOUNTER — Other Ambulatory Visit: Payer: Self-pay | Admitting: Family Medicine

## 2022-08-19 DIAGNOSIS — F419 Anxiety disorder, unspecified: Secondary | ICD-10-CM

## 2022-08-19 NOTE — Telephone Encounter (Signed)
Spoke with pt and advised that he has started seeing Dorothyann Peng ,NP seen him last week per pt . Can you deny this medication?

## 2022-08-19 NOTE — Telephone Encounter (Signed)
Patient needs refill on ALPRAZolam Duanne Moron) 1 MG tablet    Patient is no longer being seen at [facility]        Please advise

## 2022-08-20 NOTE — Telephone Encounter (Signed)
This has been filled by another provider. Okay to fill?

## 2022-09-06 ENCOUNTER — Other Ambulatory Visit: Payer: Self-pay | Admitting: Adult Health

## 2022-09-06 DIAGNOSIS — R739 Hyperglycemia, unspecified: Secondary | ICD-10-CM

## 2022-09-09 NOTE — Telephone Encounter (Signed)
Okay for refill?  

## 2022-10-21 ENCOUNTER — Ambulatory Visit (INDEPENDENT_AMBULATORY_CARE_PROVIDER_SITE_OTHER): Payer: Medicare Other

## 2022-10-21 VITALS — Ht 75.0 in | Wt 272.0 lb

## 2022-10-21 DIAGNOSIS — Z0000 Encounter for general adult medical examination without abnormal findings: Secondary | ICD-10-CM | POA: Diagnosis not present

## 2022-10-21 NOTE — Progress Notes (Signed)
Subjective:   Wayne Rhodes is a 67 y.o. male who presents for Medicare Annual/Subsequent preventive examination.  Review of Systems    Virtual Visit via Telephone Note  I connected with  Wayne Rhodes on 10/21/22 at 11:15 AM EST by telephone and verified that I am speaking with the correct person using two identifiers.  Location: Patient: Home Provider: Office Persons participating in the virtual visit: patient/Nurse Health Advisor   I discussed the limitations, risks, security and privacy concerns of performing an evaluation and management service by telephone and the availability of in person appointments. The patient expressed understanding and agreed to proceed.  Interactive audio and video telecommunications were attempted between this nurse and patient, however failed, due to patient having technical difficulties OR patient did not have access to video capability.  We continued and completed visit with audio only.  Some vital signs may be absent or patient reported.   Criselda Peaches, LPN  Cardiac Risk Factors include: advanced age (>69mn, >>64women)     Objective:    Today's Vitals   10/21/22 1112  Weight: 272 lb (123.4 kg)  Height: _0  (1.905 m)   Body mass index is 34 kg/m.     10/21/2022   11:19 AM 10/10/2021    9:56 AM 09/03/2020    9:50 AM 08/31/2019   10:17 AM 09/26/2011   10:54 AM 09/25/2011    9:00 PM 09/17/2011    2:21 PM  Advanced Directives  Does Patient Have a Medical Advance Directive? No No No No Patient would not like information Patient does not have advance directive;Patient would not like information Patient does not have advance directive  Would patient like information on creating a medical advance directive? No - Patient declined No - Patient declined Yes (MAU/Ambulatory/Procedural Areas - Information given) Yes (MAU/Ambulatory/Procedural Areas - Information given)     Pre-existing out of facility DNR order (yellow form or pink MOST  form)     No No     Current Medications (verified) Outpatient Encounter Medications as of 10/21/2022  Medication Sig   ALPRAZolam (XANAX) 1 MG tablet TAKE 1 TABLET BY MOUTH TWICE A DAY AS NEEDED   atorvastatin (LIPITOR) 10 MG tablet Take 1 tablet (10 mg total) by mouth daily.   Calcium Polycarbophil (FIBER-CAPS PO) Take by mouth.   EPINEPHrine (EPI-PEN) 0.3 mg/0.3 mL DEVI Inject 0.3 mg into the muscle as needed. For anaphylaxis   L-Methylfolate 15 MG TABS Take 1 tablet by mouth daily.   metoprolol succinate (TOPROL-XL) 25 MG 24 hr tablet Take 1 tablet (25 mg total) by mouth daily.   oxyCODONE (ROXICODONE) 15 MG immediate release tablet Take 15 mg by mouth every 6 (six) hours as needed (Dr. RNelva Bush.    potassium chloride SA (KLOR-CON M20) 20 MEQ tablet TAKE 1 TABLET BY MOUTH EVERY DAY   sildenafil (VIAGRA) 50 MG tablet Take 0.5-1 tablets (25-50 mg total) by mouth daily as needed for erectile dysfunction.   triamcinolone ointment (KENALOG) 0.5 % APPLY TO AFFECTED AREA TWICE A DAY   [DISCONTINUED] buPROPion (WELLBUTRIN XL) 150 MG 24 hr tablet Take 150 mg by mouth daily.   [DISCONTINUED] diphenhydrAMINE (BENADRYL) 25 MG tablet Take 25 mg by mouth 2 (two) times daily as needed. For swelling   No facility-administered encounter medications on file as of 10/21/2022.    Allergies (verified) Clonazepam, Quinapril, and Sertraline hcl   History: Past Medical History:  Diagnosis Date   Anxiety    takes xanax  for anxiety   Arthritis    Cancer ([place])    melanoma on nose    Cataract    bilateral sx   GERD (gastroesophageal reflux disease)    Hyperlipidemia    on meds   Hypertension    on meds   Pneumonia    had pneumonia 4-5 months ago   Recurrent upper respiratory infection (URI)    recent chest cold - treated with mucinex and cough medicine - now improved   Past Surgical History:  Procedure Laterality Date   ANTERIOR CERVICAL DECOMP/DISCECTOMY FUSION  09/25/2011   Procedure: ANTERIOR  CERVICAL DECOMPRESSION/DISCECTOMY FUSION 1 LEVEL;  Surgeon: Dahlia Bailiff;  Location: [facility];  Service: Orthopedics;  Laterality: N/A;  ACDF Cervical 5-6   CATARACT EXTRACTION, BILATERAL Bilateral    COLONOSCOPY  2020   GM-MAC-goly(good)-tics/hems/SSP/TA   ELBOW SURGERY Bilateral    KNEE ARTHROSCOPY Left    POLYPECTOMY  2020   SSP/TA   SHOULDER ARTHROSCOPY Left    Family History  Problem Relation Age of Onset   Bladder Cancer Father    Prostate cancer Brother    Colon cancer Neg Hx    Colon polyps Neg Hx    Esophageal cancer Neg Hx    Rectal cancer Neg Hx    Stomach cancer Neg Hx    Social History   Socioeconomic History   Marital status: Single    Spouse name: Not on file   Number of children: 2   Years of education: Not on file   Highest education level: Not on file  Occupational History   Occupation: Retired    Comment: Service Department  Tobacco Use   Smoking status: Never   Smokeless tobacco: Never  Vaping Use   Vaping Use: Never used  Substance and Sexual Activity   Alcohol use: Yes    Alcohol/week: 1.0 - 2.0 standard drink of alcohol    Types: 1 - 2 Standard drinks or equivalent per week    Comment: occasional beer   Drug use: No   Sexual activity: Yes  Other Topics Concern   Not on file  Social History Narrative   Not on file   Social Determinants of Health   Financial Resource Strain: Low Risk  (10/21/2022)   Overall Financial Resource Strain (CARDIA)    Difficulty of Paying Living Expenses: Not hard at all  Food Insecurity: No Food Insecurity (10/21/2022)   Hunger Vital Sign    Worried About Running Out of Food in the Last Year: Never true    Ran Out of Food in the Last Year: Never true  Transportation Needs: No Transportation Needs (10/21/2022)   PRAPARE - Hydrologist (Medical): No    Lack of Transportation (Non-Medical): No  Physical Activity: Insufficiently Active (10/21/2022)   Exercise Vital Sign    Days of Exercise  per Week: 5 days    Minutes of Exercise per Session: 20 min  Stress: No Stress Concern Present (10/21/2022)   [place]    Feeling of Stress : Not at all  Social Connections: Socially Isolated (10/21/2022)   Social Connection and Isolation Panel [NHANES]    Frequency of Communication with Friends and Family: More than three times a week    Frequency of Social Gatherings with Friends and Family: More than three times a week    Attends Religious Services: Never    Marine scientist or Organizations: No  Attends Archivist Meetings: Never    Marital Status: Divorced    Tobacco Counseling Counseling given: Not Answered   Clinical Intake:  Pre-visit preparation completed: No  Pain : No/denies pain     BMI - recorded: 34 Nutritional Status: BMI > 30  Obese Nutritional Risks: None Diabetes: No  How often do you need to have someone help you when you read instructions, pamphlets, or other written materials from your doctor or pharmacy?: 1 - Never  Diabetic? No  Interpreter Needed?: No  Information entered by :: Rolene Arbour LPN   Activities of Daily Living    10/21/2022   11:18 AM  In your present state of health, do you have any difficulty performing the following activities:  Hearing? 0  Vision? 0  Difficulty concentrating or making decisions? 0  Walking or climbing stairs? 0  Dressing or bathing? 0  Doing errands, shopping? 0  Preparing Food and eating ? N  Using the Toilet? N  In the past six months, have you accidently leaked urine? N  Do you have problems with loss of bowel control? N  Managing your Medications? N  Managing your Finances? N  Housekeeping or managing your Housekeeping? N    Patient Care Team: Dorothyann Peng, NP as PCP - General (Family Medicine) Suella Broad, MD as Consulting Physician (Physical Medicine and Rehabilitation)  Indicate any recent Medical  Services you may have received from other than [facility] providers in the past year (date may be approximate).     Assessment:   This is a routine wellness examination for [facility].  Hearing/Vision screen Hearing Screening - Comments:: Denies hearing difficulties   Vision Screening - Comments::  up to date with routine eye exams with  Deferred  Dietary issues and exercise activities discussed: Current Exercise Habits: Home exercise routine, Type of exercise: walking, Time (Minutes): 20, Frequency (Times/Week): 5, Weekly Exercise (Minutes/Week): 100, Intensity: Moderate, Exercise limited by: None identified   Goals Addressed               This Visit's Progress     Patient Stated (pt-stated)        Drink more water.       Depression Screen    10/21/2022   11:17 AM 07/24/2022    9:18 AM 05/14/2022   11:45 AM 01/28/2022    2:37 PM 11/26/2021   10:18 AM 10/17/2021    1:43 PM 10/10/2021    9:59 AM  PHQ 2/9 Scores  PHQ - 2 Score 0 0 0 0 0 0 0  PHQ- 9 Score  0 0 0 0 0     Fall Risk    10/21/2022   11:18 AM 07/24/2022    9:18 AM 05/14/2022   11:45 AM 02/12/2022    9:55 AM 01/28/2022    2:36 PM  Fall Risk   Falls in the past year? 0 0 0 0 0  Number falls in past yr: 0 0 0 0 0  Injury with Fall? 0 0 0 0 0  Risk for fall due to : _0   Follow up Falls prevention discussed Falls evaluation completed Falls evaluation completed Falls evaluation completed Falls evaluation completed    [place]:  Any stairs in or around the home? No  If so, are there any without handrails? No  Home free of loose throw rugs in walkways, pet beds, electrical  cords, etc? Yes  Adequate lighting in your home to reduce risk of falls? Yes   ASSISTIVE DEVICES UTILIZED TO PREVENT FALLS:  Life alert? No  Use of a cane, walker or w/c? No  Grab bars in the bathroom? Yes  Shower chair or bench in shower? No  Elevated  toilet seat or a handicapped toilet? No   TIMED UP AND GO:  Was the test performed? No . Audio Visit  Cognitive Function:        10/21/2022   11:20 AM  6CIT Screen  What Year? 0 points  What month? 0 points  What time? 0 points  Count back from 20 0 points  Months in reverse 0 points  Repeat phrase 0 points  Total Score 0 points    Immunizations  There is no immunization history on file for this patient.    Flu Vaccine status: Declined, Education has been provided regarding the importance of this vaccine but patient still declined. Advised may receive this vaccine at local pharmacy or Health Dept. Aware to provide a copy of the vaccination record if obtained from local pharmacy or Health Dept. Verbalized acceptance and understanding.  Pneumococcal vaccine status: Declined,  Education has been provided regarding the importance of this vaccine but patient still declined. Advised may receive this vaccine at local pharmacy or Health Dept. Aware to provide a copy of the vaccination record if obtained from local pharmacy or Health Dept. Verbalized acceptance and understanding.   Covid-19 vaccine status: Declined, Education has been provided regarding the importance of this vaccine but patient still declined. Advised may receive this vaccine at local pharmacy or Health Dept.or vaccine clinic. Aware to provide a copy of the vaccination record if obtained from local pharmacy or Health Dept. Verbalized acceptance and understanding.  Qualifies for Shingles Vaccine? Yes   Zostavax completed No   Shingrix Completed?: No.    Education has been provided regarding the importance of this vaccine. Patient has been advised to call insurance company to determine out of pocket expense if they have not yet received this vaccine. Advised may also receive vaccine at local pharmacy or Health Dept. Verbalized acceptance and understanding.  Screening Tests Health Maintenance  Topic Date Due   COVID-19  Vaccine (1) 11/06/2022 (Originally 02/17/1961)   Zoster Vaccines- Shingrix (1 of 2) 01/20/2023 (Originally 02/16/1975)   Pneumonia Vaccine 57+ Years old (1 - PCV) 05/15/2023 (Originally 02/17/2019)   Hepatitis C Screening  05/15/2023 (Originally 02/17/1974)   INFLUENZA VACCINE  12/19/2087 (Originally 05/13/2022)   Medicare Annual Wellness (AWV)  10/22/2023   COLONOSCOPY (Pts 45-70yr Insurance coverage will need to be confirmed)  06/25/2025   HPV VACCINES  Aged Out   DTaP/Tdap/Td  Discontinued    Health Maintenance  There are no preventive care reminders to display for this patient.   Colorectal cancer screening: Type of screening: Colonoscopy. Completed 06/25/22. Repeat every 3 years  Lung Cancer Screening: (Low Dose CT Chest recommended if Age 69-80years, 30 pack-year currently smoking OR have quit w/in 15years.) does not qualify.     Additional Screening:  Hepatitis C Screening: does qualify; Completed Deferred  Vision Screening: Recommended annual ophthalmology exams for early detection of glaucoma and other disorders of the eye. Is the patient up to date with their annual eye exam?  Yes  Who is the provider or what is the name of the office in which the patient attends annual eye exams? Deferred If pt is not established with a provider, would they like  to be referred to a provider to establish care? No .   Dental Screening: Recommended annual dental exams for proper oral hygiene  Community Resource Referral / Chronic Care Management:  CRR required this visit?  No   CCM required this visit?  No      Plan:     I have personally reviewed and noted the following in the patient's chart:   Medical and social history Use of alcohol, tobacco or illicit drugs  Current medications and supplements including opioid prescriptions. Patient is currently taking opioid prescriptions. Information provided to patient regarding non-opioid alternatives. Patient advised to discuss non-opioid  treatment plan with their provider. Functional ability and status Nutritional status Physical activity Advanced directives List of other physicians Hospitalizations, surgeries, and ER visits in previous 12 months Vitals Screenings to include cognitive, depression, and falls Referrals and appointments  In addition, I have reviewed and discussed with patient certain preventive protocols, quality metrics, and best practice recommendations. A written personalized care plan for preventive services as well as general preventive health recommendations were provided to patient.     Criselda Peaches, LPN   06/17/721   Nurse Notes: None

## 2022-10-21 NOTE — Patient Instructions (Addendum)
Wayne Rhodes , Thank you for taking time to come for your Medicare Wellness Visit. I appreciate your ongoing commitment to your health goals. Please review the following plan we discussed and let me know if I can assist you in the future.   These are the goals we discussed:  Goals       Patient Stated (pt-stated)      Drink more water.        This is a list of the screening recommended for you and due dates:  Health Maintenance  Topic Date Due   COVID-19 Vaccine (1) 11/06/2022*   Zoster (Shingles) Vaccine (1 of 2) 01/20/2023*   Pneumonia Vaccine (1 - PCV) 05/15/2023*   Hepatitis C Screening: USPSTF Recommendation to screen - Ages 18-79 yo.  05/15/2023*   Flu Shot  12/19/2087*   Medicare Annual Wellness Visit  10/22/2023   Colon Cancer Screening  06/25/2025   HPV Vaccine  Aged Out   DTaP/Tdap/Td vaccine  Discontinued  *Topic was postponed. The date shown is not the original due date.   Opioid Pain Medicine Management Opioids are powerful medicines that are used to treat moderate to severe pain. When used for short periods of time, they can help you to: Sleep better. Do better in physical or occupational therapy. Feel better in the first few days after an injury. Recover from surgery. Opioids should be taken with the supervision of a trained health care provider. They should be taken for the shortest period of time possible. This is because opioids can be addictive, and the longer you take opioids, the greater your risk of addiction. This addiction can also be called opioid use disorder. What are the risks? Using opioid pain medicines for longer than 3 days increases your risk of side effects. Side effects include: Constipation. Nausea and vomiting. Breathing difficulties (respiratory depression). Drowsiness. Confusion. Opioid use disorder. Itching. Taking opioid pain medicine for a long period of time can affect your ability to do daily tasks. It also puts you at risk  for: Motor vehicle crashes. Depression. Suicide. Heart attack. Overdose, which can be life-threatening. What is a pain treatment plan? A pain treatment plan is an agreement between you and your health care provider. Pain is unique to each person, and treatments vary depending on your condition. To manage your pain, you and your health care provider need to work together. To help you do this: Discuss the goals of your treatment, including how much pain you might expect to have and how you will manage the pain. Review the risks and benefits of taking opioid medicines. Remember that a good treatment plan uses more than one approach and minimizes the chance of side effects. Be honest about the amount of medicines you take and about any drug or alcohol use. Get pain medicine prescriptions from only one health care provider. Pain can be managed with many types of alternative treatments. Ask your health care provider to refer you to one or more specialists who can help you manage pain through: Physical or occupational therapy. Counseling (cognitive behavioral therapy). Good nutrition. Biofeedback. Massage. Meditation. Non-opioid medicine. Following a gentle exercise program. How to use opioid pain medicine Taking medicine Take your pain medicine exactly as told by your health care provider. Take it only when you need it. If your pain gets less severe, you may take less than your prescribed dose if your health care provider approves. If you are not having pain, do nottake pain medicine unless your health care provider  tells you to take it. If your pain is severe, do nottry to treat it yourself by taking more pills than instructed on your prescription. Contact your health care provider for help. Write down the times when you take your pain medicine. It is easy to become confused while on pain medicine. Writing the time can help you avoid overdose. Take other over-the-counter or prescription  medicines only as told by your health care provider. Keeping yourself and others safe  While you are taking opioid pain medicine: Do not drive, use machinery, or power tools. Do not sign legal documents. Do not drink alcohol. Do not take sleeping pills. Do not supervise children by yourself. Do not do activities that require climbing or being in high places. Do not go to a lake, river, ocean, spa, or swimming pool. Do not share your pain medicine with anyone. Keep pain medicine in a locked cabinet or in a secure area where pets and children cannot reach it. Stopping your use of opioids If you have been taking opioid medicine for more than a few weeks, you may need to slowly decrease (taper) how much you take until you stop completely. Tapering your use of opioids can decrease your risk of symptoms of withdrawal, such as: Pain and cramping in the abdomen. Nausea. Sweating. Sleepiness. Restlessness. Uncontrollable shaking (tremors). Cravings for the medicine. Do not attempt to taper your use of opioids on your own. Talk with your health care provider about how to do this. Your health care provider may prescribe a step-down schedule based on how much medicine you are taking and how long you have been taking it. Getting rid of leftover pills Do not save any leftover pills. Get rid of leftover pills safely by: Taking the medicine to a prescription take-back program. This is usually offered by the county or law enforcement. Bringing them to a pharmacy that has a drug disposal container. Flushing them down the toilet. Check the label or package insert of your medicine to see whether this is safe to do. Throwing them out in the trash. Check the label or package insert of your medicine to see whether this is safe to do. If it is safe to throw it out, remove the medicine from the original container, put it into a sealable bag or container, and mix it with used coffee grounds, food scraps, dirt, or  cat litter before putting it in the trash. Follow these instructions at home: Activity Do exercises as told by your health care provider. Avoid activities that make your pain worse. Return to your normal activities as told by your health care provider. Ask your health care provider what activities are safe for you. General instructions You may need to take these actions to prevent or treat constipation: Drink enough fluid to keep your urine pale yellow. Take over-the-counter or prescription medicines. Eat foods that are high in fiber, such as beans, whole grains, and fresh fruits and vegetables. Limit foods that are high in fat and processed sugars, such as fried or sweet foods. Keep all follow-up visits. This is important. Where to find support If you have been taking opioids for a long time, you may benefit from receiving support for quitting from a local support group or counselor. Ask your health care provider for a referral to these resources in your area. Where to find more information Centers for Disease Control and Prevention (CDC): http://www.wolf.info/ [place] Food and Drug Administration (FDA): [place] Get help right away if: You may have taken  too much of an opioid (overdosed). Common symptoms of an overdose: Your breathing is slower or more shallow than normal. You have a very slow heartbeat (pulse). You have slurred speech. You have nausea and vomiting. Your pupils become very small. You have other potential symptoms: You are very confused. You faint or feel like you will faint. You have cold, clammy skin. You have blue lips or fingernails. You have thoughts of harming yourself or harming others. These symptoms may represent a serious problem that is an emergency. Do not wait to see if the symptoms will go away. Get medical help right away. Call your local emergency services (911 in the [place] Do not drive yourself to the hospital.  If you ever feel like you may hurt yourself or  others, or have thoughts about taking your own life, get help right away. Go to your nearest emergency department or: Call your local emergency services (911 in the [place] Call the [facility] (847)445-4523 in the [place] Call a suicide crisis helpline, such as the [place] at 765-584-7631 or 988 in the [place]. This is open 24 hours a day in the [place] Text the Crisis Text Line at 703 714 6285 (in the [place].). Summary Opioid medicines can help you manage moderate to severe pain for a short period of time. A pain treatment plan is an agreement between you and your health care provider. Discuss the goals of your treatment, including how much pain you might expect to have and how you will manage the pain. If you think that you or someone else may have taken too much of an opioid, get medical help right away. This information is not intended to replace advice given to you by your health care provider. Make sure you discuss any questions you have with your health care provider. Document Revised: 04/24/2021 Document Reviewed: 01/09/2021 Elsevier Patient Education  [place] directives: Advance directive discussed with you today. Even though you declined this today, please call our office should you change your mind, and we can give you the proper paperwork for you to fill out.   Conditions/risks identified: None  Next appointment: Follow up in one year for your annual wellness visit.    Preventive Care 9 Years and Older, Male  Preventive care refers to lifestyle choices and visits with your health care provider that can promote health and wellness. What does preventive care include? A yearly physical exam. This is also called an annual well check. Dental exams once or twice a year. Routine eye exams. Ask your health care provider how often you should have your eyes checked. Personal lifestyle choices, including: Daily care of your  teeth and gums. Regular physical activity. Eating a healthy diet. Avoiding tobacco and drug use. Limiting alcohol use. Practicing safe sex. Taking low doses of aspirin every day. Taking vitamin and mineral supplements as recommended by your health care provider. What happens during an annual well check? The services and screenings done by your health care provider during your annual well check will depend on your age, overall health, lifestyle risk factors, and family history of disease. Counseling  Your health care provider may ask you questions about your: Alcohol use. Tobacco use. Drug use. Emotional well-being. Home and relationship well-being. Sexual activity. Eating habits. History of falls. Memory and ability to understand (cognition). Work and work Statistician. Screening  You may have the following tests or measurements: Height, weight, and BMI. Blood pressure. Lipid and cholesterol levels.  These may be checked every 5 years, or more frequently if you are over 33 years old. Skin check. Lung cancer screening. You may have this screening every year starting at age 64 if you have a 30-pack-year history of smoking and currently smoke or have quit within the past 15 years. Fecal occult blood test (FOBT) of the stool. You may have this test every year starting at age 48. Flexible sigmoidoscopy or colonoscopy. You may have a sigmoidoscopy every 5 years or a colonoscopy every 10 years starting at age 12. Prostate cancer screening. Recommendations will vary depending on your family history and other risks. Hepatitis C blood test. Hepatitis B blood test. Sexually transmitted disease (STD) testing. Diabetes screening. This is done by checking your blood sugar (glucose) after you have not eaten for a while (fasting). You may have this done every 1-3 years. Abdominal aortic aneurysm (AAA) screening. You may need this if you are a current or former smoker. Osteoporosis. You may be  screened starting at age 36 if you are at high risk. Talk with your health care provider about your test results, treatment options, and if necessary, the need for more tests. Vaccines  Your health care provider may recommend certain vaccines, such as: Influenza vaccine. This is recommended every year. Tetanus, diphtheria, and acellular pertussis (Tdap, Td) vaccine. You may need a Td booster every 10 years. Zoster vaccine. You may need this after age 42. Pneumococcal 13-valent conjugate (PCV13) vaccine. One dose is recommended after age 4. Pneumococcal polysaccharide (PPSV23) vaccine. One dose is recommended after age 19. Talk to your health care provider about which screenings and vaccines you need and how often you need them. This information is not intended to replace advice given to you by your health care provider. Make sure you discuss any questions you have with your health care provider. Document Released: 10/26/2015 Document Revised: 06/18/2016 Document Reviewed: 07/31/2015 Elsevier Interactive Patient Education  2017 [place] Prevention in the Home Falls can cause injuries. They can happen to people of all ages. There are many things you can do to make your home safe and to help prevent falls. What can I do on the outside of my home? Regularly fix the edges of walkways and driveways and fix any cracks. Remove anything that might make you trip as you walk through a door, such as a raised step or threshold. Trim any bushes or trees on the path to your home. Use bright outdoor lighting. Clear any walking paths of anything that might make someone trip, such as rocks or tools. Regularly check to see if handrails are loose or broken. Make sure that both sides of any steps have handrails. Any raised decks and porches should have guardrails on the edges. Have any leaves, snow, or ice cleared regularly. Use sand or salt on walking paths during winter. Clean up any spills in  your garage right away. This includes oil or grease spills. What can I do in the bathroom? Use night lights. Install grab bars by the toilet and in the tub and shower. Do not use towel bars as grab bars. Use non-skid mats or decals in the tub or shower. If you need to sit down in the shower, use a plastic, non-slip stool. Keep the floor dry. Clean up any water that spills on the floor as soon as it happens. Remove soap buildup in the tub or shower regularly. Attach bath mats securely with double-sided non-slip rug tape. Do not have throw  rugs and other things on the floor that can make you trip. What can I do in the bedroom? Use night lights. Make sure that you have a light by your bed that is easy to reach. Do not use any sheets or blankets that are too big for your bed. They should not hang down onto the floor. Have a firm chair that has side arms. You can use this for support while you get dressed. Do not have throw rugs and other things on the floor that can make you trip. What can I do in the kitchen? Clean up any spills right away. Avoid walking on wet floors. Keep items that you use a lot in easy-to-reach places. If you need to reach something above you, use a strong step stool that has a grab bar. Keep electrical cords out of the way. Do not use floor polish or wax that makes floors slippery. If you must use wax, use non-skid floor wax. Do not have throw rugs and other things on the floor that can make you trip. What can I do with my stairs? Do not leave any items on the stairs. Make sure that there are handrails on both sides of the stairs and use them. Fix handrails that are broken or loose. Make sure that handrails are as long as the stairways. Check any carpeting to make sure that it is firmly attached to the stairs. Fix any carpet that is loose or worn. Avoid having throw rugs at the top or bottom of the stairs. If you do have throw rugs, attach them to the floor with carpet  tape. Make sure that you have a light switch at the top of the stairs and the bottom of the stairs. If you do not have them, ask someone to add them for you. What else can I do to help prevent falls? Wear shoes that: Do not have high heels. Have rubber bottoms. Are comfortable and fit you well. Are closed at the toe. Do not wear sandals. If you use a stepladder: Make sure that it is fully opened. Do not climb a closed stepladder. Make sure that both sides of the stepladder are locked into place. Ask someone to hold it for you, if possible. Clearly mark and make sure that you can see: Any grab bars or handrails. First and last steps. Where the edge of each step is. Use tools that help you move around (mobility aids) if they are needed. These include: Canes. Walkers. Scooters. Crutches. Turn on the lights when you go into a dark area. Replace any light bulbs as soon as they burn out. Set up your furniture so you have a clear path. Avoid moving your furniture around. If any of your floors are uneven, fix them. If there are any pets around you, be aware of where they are. Review your medicines with your doctor. Some medicines can make you feel dizzy. This can increase your chance of falling. Ask your doctor what other things that you can do to help prevent falls. This information is not intended to replace advice given to you by your health care provider. Make sure you discuss any questions you have with your health care provider. Document Released: 07/26/2009 Document Revised: 03/06/2016 Document Reviewed: 11/03/2014 Elsevier Interactive Patient Education  2017 [employer].

## 2022-10-26 ENCOUNTER — Other Ambulatory Visit: Payer: Self-pay | Admitting: Adult Health

## 2022-10-26 DIAGNOSIS — R739 Hyperglycemia, unspecified: Secondary | ICD-10-CM

## 2022-12-23 ENCOUNTER — Other Ambulatory Visit: Payer: Self-pay | Admitting: Adult Health

## 2022-12-23 DIAGNOSIS — F419 Anxiety disorder, unspecified: Secondary | ICD-10-CM

## 2022-12-23 NOTE — Telephone Encounter (Signed)
Okay for refill?  

## 2022-12-24 DIAGNOSIS — Z79899 Other long term (current) drug therapy: Secondary | ICD-10-CM | POA: Diagnosis not present

## 2022-12-24 DIAGNOSIS — Z5181 Encounter for therapeutic drug level monitoring: Secondary | ICD-10-CM | POA: Diagnosis not present

## 2022-12-24 DIAGNOSIS — M5416 Radiculopathy, lumbar region: Secondary | ICD-10-CM | POA: Diagnosis not present

## 2022-12-24 DIAGNOSIS — M5136 Other intervertebral disc degeneration, lumbar region: Secondary | ICD-10-CM | POA: Diagnosis not present

## 2023-01-22 DIAGNOSIS — M5416 Radiculopathy, lumbar region: Secondary | ICD-10-CM | POA: Diagnosis not present

## 2023-01-23 ENCOUNTER — Telehealth: Payer: Self-pay

## 2023-01-23 ENCOUNTER — Other Ambulatory Visit: Payer: Self-pay | Admitting: Adult Health

## 2023-01-23 DIAGNOSIS — I10 Essential (primary) hypertension: Secondary | ICD-10-CM

## 2023-01-23 MED ORDER — METOPROLOL SUCCINATE ER 25 MG PO TB24: 25.0000 mg | ORAL_TABLET | Freq: Every day | ORAL | 1 refills | Status: DC

## 2023-01-23 NOTE — Telephone Encounter (Signed)
Received a fax for metoprolol for new pt. Ok to fill? Please advise

## 2023-01-27 NOTE — Telephone Encounter (Signed)
Noted  

## 2023-02-23 ENCOUNTER — Other Ambulatory Visit: Payer: Self-pay | Admitting: Adult Health

## 2023-02-23 DIAGNOSIS — R739 Hyperglycemia, unspecified: Secondary | ICD-10-CM

## 2023-03-03 ENCOUNTER — Ambulatory Visit (INDEPENDENT_AMBULATORY_CARE_PROVIDER_SITE_OTHER): Payer: Medicare Other | Admitting: Adult Health

## 2023-03-03 ENCOUNTER — Encounter: Payer: Self-pay | Admitting: Adult Health

## 2023-03-03 VITALS — BP 120/80 | HR 67 | Temp 98.5°F | Ht 75.0 in | Wt 268.0 lb

## 2023-03-03 DIAGNOSIS — Z125 Encounter for screening for malignant neoplasm of prostate: Secondary | ICD-10-CM

## 2023-03-03 DIAGNOSIS — I10 Essential (primary) hypertension: Secondary | ICD-10-CM

## 2023-03-03 DIAGNOSIS — E785 Hyperlipidemia, unspecified: Secondary | ICD-10-CM

## 2023-03-03 DIAGNOSIS — E66811 Obesity, class 1: Secondary | ICD-10-CM

## 2023-03-03 DIAGNOSIS — E669 Obesity, unspecified: Secondary | ICD-10-CM

## 2023-03-03 DIAGNOSIS — Z0000 Encounter for general adult medical examination without abnormal findings: Secondary | ICD-10-CM | POA: Diagnosis not present

## 2023-03-03 LAB — HEMOGLOBIN A1C: Hgb A1c MFr Bld: 5.9 % (ref 4.6–6.5)

## 2023-03-03 LAB — CBC
HCT: 53.6 % — ABNORMAL HIGH (ref 39.0–52.0)
Hemoglobin: 17.8 g/dL — ABNORMAL HIGH (ref 13.0–17.0)
MCHC: 33.2 g/dL (ref 30.0–36.0)
MCV: 93.6 fl (ref 78.0–100.0)
Platelets: 222 10*3/uL (ref 150.0–400.0)
RBC: 5.73 Mil/uL (ref 4.22–5.81)
RDW: 13.7 % (ref 11.5–15.5)
WBC: 5.1 10*3/uL (ref 4.0–10.5)

## 2023-03-03 LAB — LIPID PANEL
Cholesterol: 115 mg/dL (ref 0–200)
HDL: 25.2 mg/dL — ABNORMAL LOW (ref >39.00)
LDL Cholesterol: 66 mg/dL (ref 0–99)
NonHDL: 89.82
Total CHOL/HDL Ratio: 5
Triglycerides: 117 mg/dL (ref 0.0–149.0)
VLDL: 23.4 mg/dL (ref 0.0–40.0)

## 2023-03-03 LAB — COMPREHENSIVE METABOLIC PANEL
ALT: 14 U/L (ref 0–53)
AST: 21 U/L (ref 0–37)
Albumin: 4.2 g/dL (ref 3.5–5.2)
Alkaline Phosphatase: 43 U/L (ref 39–117)
BUN: 10 mg/dL (ref 6–23)
CO2: 28 mEq/L (ref 19–32)
Calcium: 9.1 mg/dL (ref 8.4–10.5)
Chloride: 103 mEq/L (ref 96–112)
Creatinine, Ser: 1.24 mg/dL (ref 0.40–1.50)
GFR: 59.52 mL/min — ABNORMAL LOW (ref >60.00)
Glucose, Bld: 85 mg/dL (ref 70–99)
Potassium: 4.8 mEq/L (ref 3.5–5.1)
Sodium: 139 mEq/L (ref 135–145)
Total Bilirubin: 1.3 mg/dL — ABNORMAL HIGH (ref 0.2–1.2)
Total Protein: 6.6 g/dL (ref 6.0–8.3)

## 2023-03-03 LAB — TSH: TSH: 3.19 u[IU]/mL (ref 0.35–5.50)

## 2023-03-03 LAB — PSA: PSA: 1.06 ng/mL (ref 0.10–4.00)

## 2023-03-03 MED ORDER — ATORVASTATIN CALCIUM 10 MG PO TABS
10.0000 mg | ORAL_TABLET | Freq: Every day | ORAL | 3 refills | Status: DC
Start: 2023-03-03 — End: 2024-02-16

## 2023-03-03 NOTE — Patient Instructions (Signed)
It was great seeing you today   We will follow up with you regarding your lab work   Please let me know if you need anything   Please work on weight loss through diet and exercise   

## 2023-03-03 NOTE — Progress Notes (Signed)
Subjective:    Patient ID: Wayne Rhodes, male    DOB: 1956-05-27, 67 y.o.   MRN: [id number]  HPI Patient presents for yearly preventative medicine examination. He is a pleasant 67 year old male who  has a past medical history of Anxiety, Arthritis, Cancer (HCC), Cataract, GERD (gastroesophageal reflux disease), Hyperlipidemia, Hypertension, Pneumonia, and Recurrent upper respiratory infection (URI).  Lumbar Radiculopathy-managed by orthopedics, he does see Dr. Ethelene Hal for lumbar injections every 3-4 months. Uses oxycodone 15 mg every 6 hours as needed. He reports that his pain is managed well.   Hyperlipidemia-managed with Lipitor 10 mg daily.  He denies myalgia or fatigue Lab Results  Component Value Date   CHOL 125 05/14/2022   HDL 26.70 (L) 05/14/2022   LDLCALC 82 04/06/2020   LDLDIRECT 65.0 05/14/2022   TRIG 225.0 (H) 05/14/2022   CHOLHDL 5 05/14/2022    Anxiety-managed with Xanax 1 mg twice daily. Feels well controlled on that medication.   Hypertension-managed with Toprol 25 mg daily.  He denies dizziness, lightheadedness, chest pain, or shortness of breath.  BP Readings from Last 3 Encounters:  03/03/23 120/80  07/24/22 110/80  06/25/22 (!) 143/86    All immunizations and health maintenance protocols were reviewed with the patient and needed orders were placed.He refuses vaccinations   Appropriate screening laboratory values were ordered for the patient including screening of hyperlipidemia, renal function and hepatic function. If indicated by BPH, a PSA was ordered.  Medication reconciliation,  past medical history, social history, problem list and allergies were reviewed in detail with the patient.   Goals were established with regard to weight loss, exercise, and  diet in compliance with medications  He is up to date on routine colon cancer screening.   He has no acute complaints today   Review of Systems  Constitutional: Negative.   HENT: Negative.    Eyes:  Negative.   Respiratory: Negative.    Cardiovascular: Negative.   Gastrointestinal: Negative.   Endocrine: Negative.   Genitourinary: Negative.   Musculoskeletal:  Positive for arthralgias and back pain.  Skin: Negative.   Allergic/Immunologic: Negative.   Neurological: Negative.   Hematological: Negative.   Psychiatric/Behavioral: Negative.    All other systems reviewed and are negative.   Past Medical History:  Diagnosis Date   Anxiety    takes xanax for anxiety   Arthritis    Cancer (HCC)    melanoma on nose    Cataract    bilateral sx   GERD (gastroesophageal reflux disease)    Hyperlipidemia    on meds   Hypertension    on meds   Pneumonia    had pneumonia 4-5 months ago   Recurrent upper respiratory infection (URI)    recent chest cold - treated with mucinex and cough medicine - now improved    Social History   Socioeconomic History   Marital status: Single    Spouse name: Not on file   Number of children: 2   Years of education: Not on file   Highest education level: Not on file  Occupational History   Occupation: Retired    Comment: Service Department  Tobacco Use   Smoking status: Never   Smokeless tobacco: Never  Vaping Use   Vaping Use: Never used  Substance and Sexual Activity   Alcohol use: Yes    Alcohol/week: 1.0 - 2.0 standard drink of alcohol    Types: 1 - 2 Standard drinks or equivalent per week  Comment: occasional beer   Drug use: No   Sexual activity: Yes  Other Topics Concern   Not on file  Social History Narrative   Not on file   Social Determinants of Health   Financial Resource Strain: Low Risk  (10/21/2022)   Overall Financial Resource Strain (CARDIA)    Difficulty of Paying Living Expenses: Not hard at all  Food Insecurity: No Food Insecurity (10/21/2022)   Hunger Vital Sign    Worried About Running Out of Food in the Last Year: Never true    Ran Out of Food in the Last Year: Never true  Transportation Needs: No  Transportation Needs (10/21/2022)   PRAPARE - Administrator, Civil Service (Medical): No    Lack of Transportation (Non-Medical): No  Physical Activity: Insufficiently Active (10/21/2022)   Exercise Vital Sign    Days of Exercise per Week: 5 days    Minutes of Exercise per Session: 20 min  Stress: No Stress Concern Present (10/21/2022)   [employer] of Occupational Health - Occupational Stress Questionnaire    Feeling of Stress : Not at all  Social Connections: Socially Isolated (10/21/2022)   Social Connection and Isolation Panel [NHANES]    Frequency of Communication with Friends and Family: More than three times a week    Frequency of Social Gatherings with Friends and Family: More than three times a week    Attends Religious Services: Never    Database administrator or Organizations: No    Attends Banker Meetings: Never    Marital Status: Divorced  Catering manager Violence: Not At Risk (10/21/2022)   Humiliation, Afraid, Rape, and Kick questionnaire    Fear of Current or Ex-Partner: No    Emotionally Abused: No    Physically Abused: No    Sexually Abused: No    Past Surgical History:  Procedure Laterality Date   ANTERIOR CERVICAL DECOMP/DISCECTOMY FUSION  09/25/2011   Procedure: ANTERIOR CERVICAL DECOMPRESSION/DISCECTOMY FUSION 1 LEVEL;  Surgeon: Alvy Beal;  Location: [facility] OR;  Service: Orthopedics;  Laterality: N/A;  ACDF Cervical 5-6   CATARACT EXTRACTION, BILATERAL Bilateral    COLONOSCOPY  2020   GM-MAC-goly(good)-tics/hems/SSP/TA   ELBOW SURGERY Bilateral    KNEE ARTHROSCOPY Left    POLYPECTOMY  2020   SSP/TA   SHOULDER ARTHROSCOPY Left     Family History  Problem Relation Age of Onset   Bladder Cancer Father    Prostate cancer Brother    Colon cancer Neg Hx    Colon polyps Neg Hx    Esophageal cancer Neg Hx    Rectal cancer Neg Hx    Stomach cancer Neg Hx     Allergies  Allergen Reactions   Clonazepam     Angioedema     Quinapril     Angioedema    Sertraline Hcl Other (See Comments)    Suicidal thoughts    Current Outpatient Medications on File Prior to Visit  Medication Sig Dispense Refill   ALPRAZolam (XANAX) 1 MG tablet TAKE 1 TABLET BY MOUTH TWICE A DAY AS NEEDED 60 tablet 2   Calcium Polycarbophil (FIBER-CAPS PO) Take by mouth.     EPINEPHrine (EPI-PEN) 0.3 mg/0.3 mL DEVI Inject 0.3 mg into the muscle as needed. For anaphylaxis     L-Methylfolate 15 MG TABS Take 1 tablet by mouth daily.     metoprolol succinate (TOPROL-XL) 25 MG 24 hr tablet Take 1 tablet (25 mg total) by mouth daily. 90 tablet  1   oxyCODONE (ROXICODONE) 15 MG immediate release tablet Take 15 mg by mouth every 6 (six) hours as needed (Dr. Ethelene Hal).      potassium chloride SA (KLOR-CON M20) 20 MEQ tablet TAKE 1 TABLET BY MOUTH EVERY DAY 90 tablet 0   sildenafil (VIAGRA) 50 MG tablet Take 0.5-1 tablets (25-50 mg total) by mouth daily as needed for erectile dysfunction. 10 tablet 3   [DISCONTINUED] buPROPion (WELLBUTRIN XL) 150 MG 24 hr tablet Take 150 mg by mouth daily.     [DISCONTINUED] diphenhydrAMINE (BENADRYL) 25 MG tablet Take 25 mg by mouth 2 (two) times daily as needed. For swelling     No current facility-administered medications on file prior to visit.    BP 120/80   Pulse 67   Temp 98.5 F (36.9 C) (Oral)   Ht 6\' 3"  (1.905 m)   Wt 268 lb (121.6 kg)   SpO2 93%   BMI 33.50 kg/m       Objective:   Physical Exam Vitals and nursing note reviewed.  Constitutional:      Appearance: Normal appearance. He is obese.  Cardiovascular:     Rate and Rhythm: Normal rate and regular rhythm.     Pulses: Normal pulses.     Heart sounds: Normal heart sounds.  Pulmonary:     Effort: Pulmonary effort is normal.     Breath sounds: Normal breath sounds.  Musculoskeletal:        General: Normal range of motion.  Skin:    General: Skin is warm and dry.  Neurological:     General: No focal deficit present.     Mental Status:  He is alert and oriented to person, place, and time.  Psychiatric:        Mood and Affect: Mood normal.        Behavior: Behavior normal.        Thought Content: Thought content normal.        Judgment: Judgment normal.        Assessment & Plan:   1. Routine general medical examination at a health care facility Today patient counseled on age appropriate routine health concerns for screening and prevention, each reviewed and up to date or declined. Immunizations reviewed and up to date or declined. Labs ordered and reviewed. Risk factors for depression reviewed and negative. Hearing function and visual acuity are intact. ADLs screened and addressed as needed. Functional ability and level of safety reviewed and appropriate. Education, counseling and referrals performed based on assessed risks today. Patient provided with a copy of personalized plan for preventive services. - Follow up in one year or sooner if needed  2. Essential hypertension - Well controlled. Continue with Metoprolol  - Lipid panel; Future - TSH; Future - CBC; Future - Comprehensive metabolic panel; Future - Hemoglobin A1c; Future  3. Hyperlipidemia, unspecified hyperlipidemia type - Continue lipitor  - Lipid panel; Future - TSH; Future - CBC; Future - Comprehensive metabolic panel; Future - Hemoglobin A1c; Future - atorvastatin (LIPITOR) 10 MG tablet; Take 1 tablet (10 mg total) by mouth daily.  Dispense: 90 tablet; Refill: 3  4. Prostate cancer screening  - PSA; Future  5. Class 1 obesity - Encouraged weight loss through diet and exercise  - Lipid panel; Future - TSH; Future - CBC; Future - Comprehensive metabolic panel; Future - Hemoglobin A1c; Future   Shirline Frees, NP

## 2023-03-23 ENCOUNTER — Other Ambulatory Visit: Payer: Self-pay | Admitting: Adult Health

## 2023-03-23 DIAGNOSIS — F419 Anxiety disorder, unspecified: Secondary | ICD-10-CM

## 2023-03-24 NOTE — Telephone Encounter (Signed)
Ok to fill 

## 2023-04-28 DIAGNOSIS — M5417 Radiculopathy, lumbosacral region: Secondary | ICD-10-CM | POA: Diagnosis not present

## 2023-04-28 DIAGNOSIS — M5416 Radiculopathy, lumbar region: Secondary | ICD-10-CM | POA: Diagnosis not present

## 2023-04-28 DIAGNOSIS — M5136 Other intervertebral disc degeneration, lumbar region: Secondary | ICD-10-CM | POA: Diagnosis not present

## 2023-05-12 DIAGNOSIS — H04123 Dry eye syndrome of bilateral lacrimal glands: Secondary | ICD-10-CM | POA: Diagnosis not present

## 2023-05-20 ENCOUNTER — Other Ambulatory Visit: Payer: Self-pay | Admitting: Adult Health

## 2023-05-20 ENCOUNTER — Ambulatory Visit (INDEPENDENT_AMBULATORY_CARE_PROVIDER_SITE_OTHER): Payer: Medicare Other | Admitting: Adult Health

## 2023-05-20 ENCOUNTER — Encounter: Payer: Self-pay | Admitting: Adult Health

## 2023-05-20 VITALS — BP 120/82 | HR 60 | Temp 97.5°F | Ht 75.0 in | Wt 271.0 lb

## 2023-05-20 DIAGNOSIS — R739 Hyperglycemia, unspecified: Secondary | ICD-10-CM

## 2023-05-20 DIAGNOSIS — R202 Paresthesia of skin: Secondary | ICD-10-CM

## 2023-05-20 DIAGNOSIS — F419 Anxiety disorder, unspecified: Secondary | ICD-10-CM | POA: Diagnosis not present

## 2023-05-20 DIAGNOSIS — I10 Essential (primary) hypertension: Secondary | ICD-10-CM

## 2023-05-20 DIAGNOSIS — R200 Anesthesia of skin: Secondary | ICD-10-CM | POA: Diagnosis not present

## 2023-05-20 MED ORDER — BUPROPION HCL ER (XL) 150 MG PO TB24: 150.0000 mg | ORAL_TABLET | Freq: Every day | ORAL | 0 refills | Status: DC

## 2023-05-20 NOTE — Progress Notes (Signed)
Subjective:    Patient ID: Wayne Rhodes, male    DOB: 04-28-1956, 67 y.o.   MRN: [id number]  HPI 67 year old male who  has a past medical history of Anxiety, Arthritis, Cancer (HCC), Cataract, GERD (gastroesophageal reflux disease), Hyperlipidemia, Hypertension, Pneumonia, and Recurrent upper respiratory infection (URI).  He presents to the office today for concern of circulation in his lower extremities. He reports that for the last few month that he has noticed that when he is sitting for extended periods of time ( 30-60in)  that he gets cramps in his legs and when he touches his feet the " feel ice cold". He does feel like his feet become " numb and tingling". He also reports some lower extremity edema when this happens.He does not sit cross legged. He does not have a history of DM type 2   Further more he is taking Xanax 1 mg BID for anxiety, reports today " 60 tablets is not enough, I need more".    Review of Systems See HPI   Past Medical History:  Diagnosis Date   Anxiety    takes xanax for anxiety   Arthritis    Cancer (HCC)    melanoma on nose    Cataract    bilateral sx   GERD (gastroesophageal reflux disease)    Hyperlipidemia    on meds   Hypertension    on meds   Pneumonia    had pneumonia 4-5 months ago   Recurrent upper respiratory infection (URI)    recent chest cold - treated with mucinex and cough medicine - now improved    Social History   Socioeconomic History   Marital status: Single    Spouse name: Not on file   Number of children: 2   Years of education: Not on file   Highest education level: Not on file  Occupational History   Occupation: Retired    Comment: Service Department  Tobacco Use   Smoking status: Never   Smokeless tobacco: Never  Vaping Use   Vaping status: Never Used  Substance and Sexual Activity   Alcohol use: Yes    Alcohol/week: 1.0 - 2.0 standard drink of alcohol    Types: 1 - 2 Standard drinks or equivalent per week     Comment: occasional beer   Drug use: No   Sexual activity: Yes  Other Topics Concern   Not on file  Social History Narrative   Not on file   Social Determinants of Health   Financial Resource Strain: Low Risk  (10/21/2022)   Overall Financial Resource Strain (CARDIA)    Difficulty of Paying Living Expenses: Not hard at all  Food Insecurity: No Food Insecurity (10/21/2022)   Hunger Vital Sign    Worried About Running Out of Food in the Last Year: Never true    Ran Out of Food in the Last Year: Never true  Transportation Needs: No Transportation Needs (10/21/2022)   PRAPARE - Administrator, Civil Service (Medical): No    Lack of Transportation (Non-Medical): No  Physical Activity: Insufficiently Active (10/21/2022)   Exercise Vital Sign    Days of Exercise per Week: 5 days    Minutes of Exercise per Session: 20 min  Stress: No Stress Concern Present (10/21/2022)   [employer] of Occupational Health - Occupational Stress Questionnaire    Feeling of Stress : Not at all  Social Connections: Socially Isolated (10/21/2022)   Social Connection and Isolation Panel [  NHANES]    Frequency of Communication with Friends and Family: More than three times a week    Frequency of Social Gatherings with Friends and Family: More than three times a week    Attends Religious Services: Never    Database administrator or Organizations: No    Attends Banker Meetings: Never    Marital Status: Divorced  Catering manager Violence: Not At Risk (10/21/2022)   Humiliation, Afraid, Rape, and Kick questionnaire    Fear of Current or Ex-Partner: No    Emotionally Abused: No    Physically Abused: No    Sexually Abused: No    Past Surgical History:  Procedure Laterality Date   ANTERIOR CERVICAL DECOMP/DISCECTOMY FUSION  09/25/2011   Procedure: ANTERIOR CERVICAL DECOMPRESSION/DISCECTOMY FUSION 1 LEVEL;  Surgeon: Alvy Beal;  Location: [facility] OR;  Service: Orthopedics;  Laterality:  N/A;  ACDF Cervical 5-6   CATARACT EXTRACTION, BILATERAL Bilateral    COLONOSCOPY  2020   GM-MAC-goly(good)-tics/hems/SSP/TA   ELBOW SURGERY Bilateral    KNEE ARTHROSCOPY Left    POLYPECTOMY  2020   SSP/TA   SHOULDER ARTHROSCOPY Left     Family History  Problem Relation Age of Onset   Bladder Cancer Father    Prostate cancer Brother    Colon cancer Neg Hx    Colon polyps Neg Hx    Esophageal cancer Neg Hx    Rectal cancer Neg Hx    Stomach cancer Neg Hx     Allergies  Allergen Reactions   Clonazepam     Angioedema    Quinapril     Angioedema    Sertraline Hcl Other (See Comments)    Suicidal thoughts    Current Outpatient Medications on File Prior to Visit  Medication Sig Dispense Refill   ALPRAZolam (XANAX) 1 MG tablet TAKE 1 TABLET BY MOUTH TWICE A DAY AS NEEDED 60 tablet 2   atorvastatin (LIPITOR) 10 MG tablet Take 1 tablet (10 mg total) by mouth daily. 90 tablet 3   Calcium Polycarbophil (FIBER-CAPS PO) Take by mouth.     EPINEPHrine (EPI-PEN) 0.3 mg/0.3 mL DEVI Inject 0.3 mg into the muscle as needed. For anaphylaxis     L-Methylfolate 15 MG TABS Take 1 tablet by mouth daily.     metoprolol succinate (TOPROL-XL) 25 MG 24 hr tablet Take 1 tablet (25 mg total) by mouth daily. 90 tablet 1   oxyCODONE (ROXICODONE) 15 MG immediate release tablet Take 15 mg by mouth every 6 (six) hours as needed (Dr. Ethelene Hal).      potassium chloride SA (KLOR-CON M20) 20 MEQ tablet TAKE 1 TABLET BY MOUTH EVERY DAY 90 tablet 0   sildenafil (VIAGRA) 50 MG tablet Take 0.5-1 tablets (25-50 mg total) by mouth daily as needed for erectile dysfunction. 10 tablet 3   [DISCONTINUED] diphenhydrAMINE (BENADRYL) 25 MG tablet Take 25 mg by mouth 2 (two) times daily as needed. For swelling     [DISCONTINUED] buPROPion (WELLBUTRIN XL) 150 MG 24 hr tablet Take 150 mg by mouth daily.     No current facility-administered medications on file prior to visit.    BP 120/82   Pulse 60   Temp (!) 97.5 F  (36.4 C) (Oral)   Ht 6\' 3"  (1.905 m)   Wt 271 lb (122.9 kg)   SpO2 95%   BMI 33.87 kg/m       Objective:   Physical Exam Vitals and nursing note reviewed.  Constitutional:  Appearance: Normal appearance.  Cardiovascular:     Rate and Rhythm: Normal rate and regular rhythm.     Pulses: Normal pulses.          Popliteal pulses are 2+ on the right side and 2+ on the left side.       Dorsalis pedis pulses are 2+ on the right side and 2+ on the left side.       Posterior tibial pulses are 2+ on the right side and 2+ on the left side.     Heart sounds: Normal heart sounds.  Pulmonary:     Effort: Pulmonary effort is normal.     Breath sounds: Normal breath sounds.  Musculoskeletal:        General: No swelling, tenderness or deformity. Normal range of motion.     Right lower leg: No edema.     Left lower leg: No edema.  Skin:    General: Skin is warm and dry.     Capillary Refill: Capillary refill takes less than 2 seconds.  Neurological:     General: No focal deficit present.     Mental Status: He is alert and oriented to person, place, and time.  Psychiatric:        Mood and Affect: Mood normal.        Behavior: Behavior normal.        Thought Content: Thought content normal.        Judgment: Judgment normal.       Assessment & Plan:  1. Numbness and tingling of left lower extremity - No signs of vascular compromise in the office today. Due to his report of symptoms will order ABI's. I am wondering if the sensation he is feeling may also be neuropathy.  - VAS [place] ABI WITH/WO TBI; Future  2. Anxiety - Advised I will not increase his Xanax usage but will start him on Wellbutrin to help with his anxiety symptoms  - buPROPion (WELLBUTRIN XL) 150 MG 24 hr tablet; Take 1 tablet (150 mg total) by mouth daily.  Dispense: 30 tablet; Refill: 0   Shirline Frees, NP

## 2023-05-20 NOTE — Patient Instructions (Addendum)
I am going to order some testing on your legs to look for circulation issues - someone one will call you   I am also going to add a medication called Wellbutrin to help with Anxiety   Please follow up in 30 days

## 2023-05-28 ENCOUNTER — Encounter (INDEPENDENT_AMBULATORY_CARE_PROVIDER_SITE_OTHER): Payer: Self-pay

## 2023-06-16 ENCOUNTER — Ambulatory Visit (INDEPENDENT_AMBULATORY_CARE_PROVIDER_SITE_OTHER): Payer: Medicare Other

## 2023-06-16 DIAGNOSIS — R200 Anesthesia of skin: Secondary | ICD-10-CM | POA: Diagnosis not present

## 2023-06-16 DIAGNOSIS — R202 Paresthesia of skin: Secondary | ICD-10-CM

## 2023-06-17 LAB — VAS US ABI WITH/WO TBI
Left ABI: 1.28
Right ABI: 1.36

## 2023-06-18 ENCOUNTER — Encounter: Payer: Self-pay | Admitting: Adult Health

## 2023-06-18 ENCOUNTER — Ambulatory Visit (INDEPENDENT_AMBULATORY_CARE_PROVIDER_SITE_OTHER): Payer: Medicare Other | Admitting: Adult Health

## 2023-06-18 VITALS — BP 132/84 | HR 70 | Temp 98.1°F | Wt 266.0 lb

## 2023-06-18 DIAGNOSIS — F419 Anxiety disorder, unspecified: Secondary | ICD-10-CM

## 2023-06-18 DIAGNOSIS — R200 Anesthesia of skin: Secondary | ICD-10-CM | POA: Diagnosis not present

## 2023-06-18 DIAGNOSIS — R202 Paresthesia of skin: Secondary | ICD-10-CM

## 2023-06-18 MED ORDER — BUPROPION HCL ER (XL) 150 MG PO TB24
150.0000 mg | ORAL_TABLET | Freq: Every day | ORAL | 1 refills | Status: DC
Start: 2023-06-18 — End: 2023-10-20

## 2023-06-18 NOTE — Progress Notes (Signed)
Subjective:    Patient ID: Wayne Rhodes, male    DOB: 1956/01/14, 67 y.o.   MRN: [id number]  HPI 67 year old male who  has a past medical history of Anxiety, Arthritis, Cancer (HCC), Cataract, GERD (gastroesophageal reflux disease), Hyperlipidemia, Hypertension, Pneumonia, and Recurrent upper respiratory infection (URI).  He is being evaluated today for 1 month follow-up regarding anxiety.  When he was last seen he reported that his anxiety seems to be worse and that Xanax 1 mg twice daily was "not enough".  I did not increase his Wellbutrin dose but instead started on Wellbutrin to help with his anxiety symptoms.Today he reports that he feels as though his anxiety is better and he is having to use less Xanax then before he went on the mediation.   He also continues to have numbness and tingling in his left lower extremity.  He had vascular ABIs done earlier this month which were normal.  Feels as though the numbness and tingling is coming from his low back and is only present when he sits for "a long period of time".  And has an appointment set up for next week to have an epidural injection.  Review of Systems See HPI   Past Medical History:  Diagnosis Date   Anxiety    takes xanax for anxiety   Arthritis    Cancer (HCC)    melanoma on nose    Cataract    bilateral sx   GERD (gastroesophageal reflux disease)    Hyperlipidemia    on meds   Hypertension    on meds   Pneumonia    had pneumonia 4-5 months ago   Recurrent upper respiratory infection (URI)    recent chest cold - treated with mucinex and cough medicine - now improved    Social History   Socioeconomic History   Marital status: Single    Spouse name: Not on file   Number of children: 2   Years of education: Not on file   Highest education level: Not on file  Occupational History   Occupation: Retired    Comment: Service Department  Tobacco Use   Smoking status: Never   Smokeless tobacco: Never  Vaping Use    Vaping status: Never Used  Substance and Sexual Activity   Alcohol use: Yes    Alcohol/week: 1.0 - 2.0 standard drink of alcohol    Types: 1 - 2 Standard drinks or equivalent per week    Comment: occasional beer   Drug use: No   Sexual activity: Yes  Other Topics Concern   Not on file  Social History Narrative   Not on file   Social Determinants of Health   Financial Resource Strain: Low Risk  (10/21/2022)   Overall Financial Resource Strain (CARDIA)    Difficulty of Paying Living Expenses: Not hard at all  Food Insecurity: No Food Insecurity (10/21/2022)   Hunger Vital Sign    Worried About Running Out of Food in the Last Year: Never true    Ran Out of Food in the Last Year: Never true  Transportation Needs: No Transportation Needs (10/21/2022)   PRAPARE - Administrator, Civil Service (Medical): No    Lack of Transportation (Non-Medical): No  Physical Activity: Insufficiently Active (10/21/2022)   Exercise Vital Sign    Days of Exercise per Week: 5 days    Minutes of Exercise per Session: 20 min  Stress: No Stress Concern Present (10/21/2022)   [place]  [employer] - Occupational Stress Questionnaire    Feeling of Stress : Not at all  Social Connections: Socially Isolated (10/21/2022)   Social Connection and Isolation Panel [NHANES]    Frequency of Communication with Friends and Family: More than three times a week    Frequency of Social Gatherings with Friends and Family: More than three times a week    Attends Religious Services: Never    Database administrator or Organizations: No    Attends Banker Meetings: Never    Marital Status: Divorced  Catering manager Violence: Not At Risk (10/21/2022)   Humiliation, Afraid, Rape, and Kick questionnaire    Fear of Current or Ex-Partner: No    Emotionally Abused: No    Physically Abused: No    Sexually Abused: No    Past Surgical History:  Procedure Laterality Date   ANTERIOR CERVICAL  DECOMP/DISCECTOMY FUSION  09/25/2011   Procedure: ANTERIOR CERVICAL DECOMPRESSION/DISCECTOMY FUSION 1 LEVEL;  Surgeon: Alvy Beal;  Location: [facility] OR;  Service: Orthopedics;  Laterality: N/A;  ACDF Cervical 5-6   CATARACT EXTRACTION, BILATERAL Bilateral    COLONOSCOPY  2020   GM-MAC-goly(good)-tics/hems/SSP/TA   ELBOW SURGERY Bilateral    KNEE ARTHROSCOPY Left    POLYPECTOMY  2020   SSP/TA   SHOULDER ARTHROSCOPY Left     Family History  Problem Relation Age of Onset   Bladder Cancer Father    Prostate cancer Brother    Colon cancer Neg Hx    Colon polyps Neg Hx    Esophageal cancer Neg Hx    Rectal cancer Neg Hx    Stomach cancer Neg Hx     Allergies  Allergen Reactions   Clonazepam     Angioedema    Quinapril     Angioedema    Sertraline Hcl Other (See Comments)    Suicidal thoughts    Current Outpatient Medications on File Prior to Visit  Medication Sig Dispense Refill   ALPRAZolam (XANAX) 1 MG tablet TAKE 1 TABLET BY MOUTH TWICE A DAY AS NEEDED 60 tablet 2   atorvastatin (LIPITOR) 10 MG tablet Take 1 tablet (10 mg total) by mouth daily. 90 tablet 3   buPROPion (WELLBUTRIN XL) 150 MG 24 hr tablet Take 1 tablet (150 mg total) by mouth daily. 30 tablet 0   Calcium Polycarbophil (FIBER-CAPS PO) Take by mouth.     EPINEPHrine (EPI-PEN) 0.3 mg/0.3 mL DEVI Inject 0.3 mg into the muscle as needed. For anaphylaxis     L-Methylfolate 15 MG TABS Take 1 tablet by mouth daily.     metoprolol succinate (TOPROL-XL) 25 MG 24 hr tablet TAKE 1 TABLET (25 MG TOTAL) BY MOUTH DAILY. 90 tablet 1   oxyCODONE (ROXICODONE) 15 MG immediate release tablet Take 15 mg by mouth every 6 (six) hours as needed (Dr. Ethelene Hal).      potassium chloride SA (KLOR-CON M20) 20 MEQ tablet TAKE 1 TABLET BY MOUTH EVERY DAY 90 tablet 0   sildenafil (VIAGRA) 50 MG tablet Take 0.5-1 tablets (25-50 mg total) by mouth daily as needed for erectile dysfunction. 10 tablet 3   [DISCONTINUED] diphenhydrAMINE (BENADRYL)  25 MG tablet Take 25 mg by mouth 2 (two) times daily as needed. For swelling     No current facility-administered medications on file prior to visit.    BP 132/84 (BP Location: Left Arm, Patient Position: Sitting, Cuff Size: Large)   Pulse 70   Temp 98.1 F (36.7 C) (Oral)  Wt 266 lb (120.7 kg)   SpO2 95%   BMI 33.25 kg/m       Objective:   Physical Exam Vitals and nursing note reviewed.  Constitutional:      Appearance: Normal appearance.  Cardiovascular:     Rate and Rhythm: Normal rate and regular rhythm.     Pulses: Normal pulses.     Heart sounds: Normal heart sounds.  Pulmonary:     Effort: Pulmonary effort is normal.     Breath sounds: Normal breath sounds.  Skin:    General: Skin is warm and dry.  Neurological:     General: No focal deficit present.     Mental Status: He is alert and oriented to person, place, and time.  Psychiatric:        Mood and Affect: Mood normal.        Behavior: Behavior normal.        Thought Content: Thought content normal.        Judgment: Judgment normal.        Assessment & Plan:  1. Anxiety Improved. Continue with Wellbutrin 150 mg XR - buPROPion (WELLBUTRIN XL) 150 MG 24 hr tablet; Take 1 tablet (150 mg total) by mouth daily.  Dispense: 90 tablet; Refill: 1  2. Numbness and tingling of left lower extremity - Follow up with [facility]    Shirline Frees, NP

## 2023-06-19 ENCOUNTER — Ambulatory Visit: Payer: Medicare Other | Admitting: Adult Health

## 2023-06-22 ENCOUNTER — Other Ambulatory Visit: Payer: Self-pay | Admitting: Adult Health

## 2023-06-22 DIAGNOSIS — F419 Anxiety disorder, unspecified: Secondary | ICD-10-CM

## 2023-06-23 ENCOUNTER — Ambulatory Visit: Payer: Medicare Other | Admitting: Adult Health

## 2023-06-23 NOTE — Telephone Encounter (Signed)
Okay for refill?  

## 2023-06-25 DIAGNOSIS — M5416 Radiculopathy, lumbar region: Secondary | ICD-10-CM | POA: Diagnosis not present

## 2023-08-27 DIAGNOSIS — M5417 Radiculopathy, lumbosacral region: Secondary | ICD-10-CM | POA: Diagnosis not present

## 2023-08-27 DIAGNOSIS — M5416 Radiculopathy, lumbar region: Secondary | ICD-10-CM | POA: Diagnosis not present

## 2023-08-27 DIAGNOSIS — M51362 Other intervertebral disc degeneration, lumbar region with discogenic back pain and lower extremity pain: Secondary | ICD-10-CM | POA: Diagnosis not present

## 2023-09-21 ENCOUNTER — Other Ambulatory Visit: Payer: Self-pay | Admitting: Adult Health

## 2023-09-21 DIAGNOSIS — F419 Anxiety disorder, unspecified: Secondary | ICD-10-CM

## 2023-09-22 NOTE — Telephone Encounter (Signed)
Okay for refill?  

## 2023-10-15 ENCOUNTER — Other Ambulatory Visit: Payer: Self-pay | Admitting: Adult Health

## 2023-10-15 DIAGNOSIS — R739 Hyperglycemia, unspecified: Secondary | ICD-10-CM

## 2023-10-19 ENCOUNTER — Other Ambulatory Visit: Payer: Self-pay | Admitting: Adult Health

## 2023-10-19 DIAGNOSIS — F419 Anxiety disorder, unspecified: Secondary | ICD-10-CM

## 2023-10-19 DIAGNOSIS — I10 Essential (primary) hypertension: Secondary | ICD-10-CM

## 2023-10-19 DIAGNOSIS — N529 Male erectile dysfunction, unspecified: Secondary | ICD-10-CM

## 2023-11-03 DIAGNOSIS — M5416 Radiculopathy, lumbar region: Secondary | ICD-10-CM | POA: Diagnosis not present

## 2023-11-18 ENCOUNTER — Other Ambulatory Visit: Payer: Self-pay | Admitting: Adult Health

## 2023-11-18 DIAGNOSIS — R739 Hyperglycemia, unspecified: Secondary | ICD-10-CM

## 2023-12-17 ENCOUNTER — Other Ambulatory Visit: Payer: Self-pay | Admitting: Adult Health

## 2023-12-17 DIAGNOSIS — F419 Anxiety disorder, unspecified: Secondary | ICD-10-CM

## 2023-12-17 NOTE — Telephone Encounter (Signed)
 Okay for refill?

## 2023-12-24 DIAGNOSIS — M5416 Radiculopathy, lumbar region: Secondary | ICD-10-CM | POA: Diagnosis not present

## 2023-12-24 DIAGNOSIS — Z79899 Other long term (current) drug therapy: Secondary | ICD-10-CM | POA: Diagnosis not present

## 2023-12-24 DIAGNOSIS — M51362 Other intervertebral disc degeneration, lumbar region with discogenic back pain and lower extremity pain: Secondary | ICD-10-CM | POA: Diagnosis not present

## 2023-12-24 DIAGNOSIS — Z5181 Encounter for therapeutic drug level monitoring: Secondary | ICD-10-CM | POA: Diagnosis not present

## 2023-12-24 DIAGNOSIS — M5417 Radiculopathy, lumbosacral region: Secondary | ICD-10-CM | POA: Diagnosis not present

## 2024-01-21 ENCOUNTER — Encounter: Payer: Self-pay | Admitting: Family Medicine

## 2024-01-21 ENCOUNTER — Ambulatory Visit: Payer: Medicare Other | Admitting: Family Medicine

## 2024-01-21 DIAGNOSIS — Z0000 Encounter for general adult medical examination without abnormal findings: Secondary | ICD-10-CM | POA: Diagnosis not present

## 2024-01-21 NOTE — Progress Notes (Signed)
 PATIENT CHECK-IN and HEALTH RISK ASSESSMENT QUESTIONNAIRE:  -completed by phone/video for upcoming Medicare Preventive Visit  Pre-Visit Check-in: 1)Vitals (height, wt, BP, etc) - record in vitals section for visit on day of visit Request home vitals (wt, BP, etc.) and enter into vitals, THEN update Vital Signs SmartPhrase below at the top of the HPI. See below.  2)Review and Update Medications, Allergies PMH, Surgeries, Social history in Epic 3)Hospitalizations in the last year with date/reason? No  4)Review and Update Care Team (patient's specialists) in Epic 5) Complete PHQ9 in Epic  6) Complete Fall Screening in Epic 7)Review all Health Maintenance Due and order under PCP if not done.  Medicare Wellness Patient Questionnaire:  Answer theses question about your habits: How often do you have a drink containing alcohol?N/A How many drinks containing alcohol do you have on a typical day when you are drinking?N/A  How often do you have six or more drinks on one occasion?N/A Have you ever smoked?No Quit date if applicable? N/A  How many packs a day do/did you smoke? N/A Do you use smokeless tobacco?No Do you use an illicit drugs?No On average, how many days per week do you engage in moderate to strenuous exercise (like a brisk walk)? Walking, leg lifts; 2-3 days a week, about 15 minutes Are you sexually active? Yes Number of partners? 1  Typical breakfast: Varies  Typical lunch: Varies  Typical dinner: Varies  Typical snacks: Crackers  Beverages:  Soda and water   Answer theses question about your everyday activities: Can you perform most household chores? Yes  Are you deaf or have significant trouble hearing? No Do you feel that you have a problem with memory?No Do you feel safe at home?Yes  Last dentist visit?  8. Do you have any difficulty performing your everyday activities?No Are you having any difficulty walking, taking medications on your own, and or difficulty managing  daily home needs?No Do you have difficulty walking or climbing stairs?No Do you have difficulty dressing or bathing?No Do you have difficulty doing errands alone such as visiting a doctor's office or shopping?No Do you currently have any difficulty preparing food and eating?No Do you currently have any difficulty using the toilet?No Do you have any difficulty managing your finances?No Do you have any difficulties with housekeeping of managing your housekeeping?No   Do you have Advanced Directives in place (Living Will, Healthcare Power or Attorney)?  No   Last eye Exam and location? Has upcoming appt    Do you currently use prescribed or non-prescribed narcotic or opioid pain medications? Yes   Do you have a history or close family history of breast, ovarian, tubal or peritoneal cancer or a family member with BRCA (breast cancer susceptibility 1 and 2) gene mutations? No  Nurse/Assistant Credentials/time stamp:   ----------------------------------------------------------------------------------------------------------------------------------------------------------------------------------------------------------------------  Because this visit was a virtual/telehealth visit, some criteria may be missing or patient reported. Any vitals not documented were not able to be obtained and vitals that have been documented are patient reported.    MEDICARE ANNUAL PREVENTIVE CARE VISIT WITH PROVIDER (Welcome to Medicare, initial annual wellness or annual wellness exam)  Virtual Visit via Phone Note  I connected with Wayne Rhodes on 01/21/24  by phone and verified that I am speaking with the correct person using two identifiers. He declined a video visit.   Location patient: home Location provider:work or home office Persons participating in the virtual visit: patient, provider  Concerns and/or follow up today: no new concerns  See HM section in Epic for other details of completed  HM.    ROS: negative for report of fevers, unintentional weight loss, vision changes, vision loss, hearing loss or change, chest pain, sob, hemoptysis, melena, hematochezia, hematuria, falls, bleeding or bruising, thoughts of suicide or self harm, memory loss  Patient-completed extensive health risk assessment - reviewed and discussed with the patient: See Health Risk Assessment completed with patient prior to the visit either above or in recent phone note. This was reviewed in detailed with the patient today and appropriate recommendations, orders and referrals were placed as needed per Summary below and patient instructions.   Review of Medical History: -PMH, PSH, Family History and current specialty and care providers reviewed and updated and listed below   Patient Care Team: Shirline Frees, NP as PCP - General (Family Medicine) Sheran Luz, MD as Consulting Physician (Physical Medicine and Rehabilitation)   Past Medical History:  Diagnosis Date   Anxiety    takes xanax for anxiety   Arthritis    Cancer (HCC)    melanoma on nose    Cataract    bilateral sx   GERD (gastroesophageal reflux disease)    Hyperlipidemia    on meds   Hypertension    on meds   Pneumonia    had pneumonia 4-5 months ago   Recurrent upper respiratory infection (URI)    recent chest cold - treated with mucinex and cough medicine - now improved    Past Surgical History:  Procedure Laterality Date   ANTERIOR CERVICAL DECOMP/DISCECTOMY FUSION  09/25/2011   Procedure: ANTERIOR CERVICAL DECOMPRESSION/DISCECTOMY FUSION 1 LEVEL;  Surgeon: Alvy Beal;  Location: [facility] OR;  Service: Orthopedics;  Laterality: N/A;  ACDF Cervical 5-6   CATARACT EXTRACTION, BILATERAL Bilateral    COLONOSCOPY  2020   GM-MAC-goly(good)-tics/hems/SSP/TA   ELBOW SURGERY Bilateral    KNEE ARTHROSCOPY Left    POLYPECTOMY  2020   SSP/TA   SHOULDER ARTHROSCOPY Left     Social History   Socioeconomic History   Marital  status: Single    Spouse name: Not on file   Number of children: 2   Years of education: Not on file   Highest education level: Not on file  Occupational History   Occupation: Retired    Comment: Service Department  Tobacco Use   Smoking status: Never   Smokeless tobacco: Never  Vaping Use   Vaping status: Never Used  Substance and Sexual Activity   Alcohol use: Yes    Alcohol/week: 1.0 - 2.0 standard drink of alcohol    Types: 1 - 2 Standard drinks or equivalent per week    Comment: occasional beer   Drug use: No   Sexual activity: Yes  Other Topics Concern   Not on file  Social History Narrative   Not on file   Social Drivers of Health   Financial Resource Strain: Low Risk  (01/21/2024)   Overall Financial Resource Strain (CARDIA)    Difficulty of Paying Living Expenses: Not hard at all  Food Insecurity: No Food Insecurity (01/21/2024)   Hunger Vital Sign    Worried About Running Out of Food in the Last Year: Never true    Ran Out of Food in the Last Year: Never true  Transportation Needs: No Transportation Needs (01/21/2024)   PRAPARE - Administrator, Civil Service (Medical): No    Lack of Transportation (Non-Medical): No  Physical Activity: Insufficiently Active (10/21/2022)   Exercise Vital Sign  Days of Exercise per Week: 5 days    Minutes of Exercise per Session: 20 min  Stress: No Stress Concern Present (01/21/2024)   [employer] of Occupational Health - Occupational Stress Questionnaire    Feeling of Stress : Not at all  Social Connections: Unknown (01/21/2024)   Social Connection and Isolation Panel [NHANES]    Frequency of Communication with Friends and Family: Twice a week    Frequency of Social Gatherings with Friends and Family: Patient unable to answer    Attends Religious Services: Never    Database administrator or Organizations: No    Attends Banker Meetings: Never    Marital Status: Divorced  Catering manager Violence:  Not At Risk (01/21/2024)   Humiliation, Afraid, Rape, and Kick questionnaire    Fear of Current or Ex-Partner: No    Emotionally Abused: No    Physically Abused: No    Sexually Abused: No    Family History  Problem Relation Age of Onset   Bladder Cancer Father    Prostate cancer Brother    Colon cancer Neg Hx    Colon polyps Neg Hx    Esophageal cancer Neg Hx    Rectal cancer Neg Hx    Stomach cancer Neg Hx     Current Outpatient Medications on File Prior to Visit  Medication Sig Dispense Refill   ALPRAZolam (XANAX) 1 MG tablet TAKE 1 TABLET BY MOUTH TWICE A DAY AS NEEDED 60 tablet 2   atorvastatin (LIPITOR) 10 MG tablet Take 1 tablet (10 mg total) by mouth daily. 90 tablet 3   buPROPion (WELLBUTRIN XL) 150 MG 24 hr tablet TAKE 1 TABLET BY MOUTH EVERY DAY 90 tablet 1   Calcium Polycarbophil (FIBER-CAPS PO) Take by mouth.     EPINEPHrine (EPI-PEN) 0.3 mg/0.3 mL DEVI Inject 0.3 mg into the muscle as needed. For anaphylaxis     L-Methylfolate 15 MG TABS Take 1 tablet by mouth daily.     metoprolol succinate (TOPROL-XL) 25 MG 24 hr tablet TAKE 1 TABLET (25 MG TOTAL) BY MOUTH DAILY. 90 tablet 1   oxyCODONE (ROXICODONE) 15 MG immediate release tablet Take 15 mg by mouth every 6 (six) hours as needed (Dr. Ethelene Hal).      potassium chloride SA (KLOR-CON M) 20 MEQ tablet TAKE 1 TABLET BY MOUTH EVERY DAY 90 tablet 0   sildenafil (VIAGRA) 50 MG tablet TAKE 0.5-1 TABLETS (25-50 MG TOTAL) BY MOUTH DAILY AS NEEDED FOR ERECTILE DYSFUNCTION. 4 tablet 9   [DISCONTINUED] diphenhydrAMINE (BENADRYL) 25 MG tablet Take 25 mg by mouth 2 (two) times daily as needed. For swelling     No current facility-administered medications on file prior to visit.    Allergies  Allergen Reactions   Clonazepam     Angioedema    Quinapril     Angioedema    Sertraline Hcl Other (See Comments)    Suicidal thoughts       Physical Exam Vitals requested from patient and listed below if patient had equipment and  was able to obtain at home for this virtual visit: There were no vitals filed for this visit. Estimated body mass index is 33.25 kg/m as calculated from the following:   Height as of 05/20/23: 6\' 3"  (1.905 m).   Weight as of 06/18/23: 266 lb (120.7 kg).  EKG (optional): deferred due to virtual visit  GENERAL: alert, oriented, no acute distress detected; full vision exam deferred due to pandemic and/or virtual encounter  PSYCH/NEURO: pleasant and cooperative, no obvious depression or anxiety, speech and thought processing grossly intact, Cognitive function grossly intact  Flowsheet Row Office Visit from 01/21/2024 in [facility] [place]  PHQ-9 Total Score 0           01/21/2024    3:22 PM 10/21/2022   11:17 AM 07/24/2022    9:18 AM 05/14/2022   11:45 AM 01/28/2022    2:37 PM  Depression screen PHQ 2/9  Decreased Interest 0 0 0 0 0  Down, Depressed, Hopeless 0 0 0 0 0  PHQ - 2 Score 0 0 0 0 0  Altered sleeping 0  0 0 0  Tired, decreased energy 0  0 0 0  Change in appetite 0  0 0 0  Feeling bad or failure about yourself  0  0 0 0  Trouble concentrating 0  0 0 0  Moving slowly or fidgety/restless 0  0 0 0  Suicidal thoughts 0  0 0 0  PHQ-9 Score 0  0 0 0  Difficult doing work/chores Not difficult at all  Not difficult at all  Not difficult at all       02/12/2022    9:55 AM 05/14/2022   11:45 AM 07/24/2022    9:18 AM 10/21/2022   11:18 AM 01/21/2024    3:24 PM  Fall Risk  Falls in the past year? 0 0 0 0 0  Was there an injury with Fall? 0 0 0 0 0  Fall Risk Category Calculator 0 0 0 0 0  Fall Risk Category (Retired) Low Low Low Low   (RETIRED) Patient Fall Risk Level Low fall risk Low fall risk Low fall risk Low fall risk   Patient at Risk for Falls Due to No Fall Risks No Fall Risks No Fall Risks No Fall Risks No Fall Risks  Fall risk Follow up Falls evaluation completed Falls evaluation completed Falls evaluation completed Falls prevention discussed Falls  evaluation completed     SUMMARY AND PLAN:  Encounter for Medicare annual wellness exam  Discussed applicable health maintenance/preventive health measures and advised and referred or ordered per patient preferences: -he declined all vaccines Health Maintenance  Topic Date Due   Hepatitis C Screening  Never done   COVID-19 Vaccine (1) 02/06/2024 (Originally 02/17/1961)   Zoster Vaccines- Shingrix (1 of 2) 04/21/2024 (Originally 02/16/1973)   Pneumonia Vaccine 42+ Years old (1 of 1 - PCV) 01/20/2025 (Originally 02/17/2004)   INFLUENZA VACCINE  12/19/2087 (Originally 05/13/2024)   Medicare Annual Wellness (AWV)  01/20/2025   Colonoscopy  06/25/2025   HPV VACCINES  Aged Out   Meningococcal B Vaccine  Aged Out   DTaP/Tdap/Td  Discontinued     Education and counseling on the following was provided based on the above review of health and a plan/checklist for the patient, along with additional information discussed, was provided for the patient in the patient instructions :  -Advised on importance of completing advanced directives, discussed options for completing and provided information in patient instructions as well -Advised and counseled on a healthy lifestyle - including the importance of a healthy diet, regular physical activity, -Reviewed patient's current diet. Advised and counseled on a whole foods based healthy diet. A summary of a healthy diet was provided in the Patient Instructions.  -reviewed patient's current physical activity level and discussed exercise guidelines for adults. Discussed community resources and ideas for safe exercise at home to assist in meeting exercise guideline recommendations in  a safe and healthy way.  -Advise yearly dental visits at minimum and regular eye exams   Follow up: see patient instructions   Patient Instructions  I really enjoyed getting to talk with you today! I am available on Tuesdays and Thursdays for virtual visits if you have any questions  or concerns, or if I can be of any further assistance.   CHECKLIST FROM ANNUAL WELLNESS VISIT:  -Follow up (please call to schedule if not scheduled after visit):   -at least once a year in person with Shirline Frees   -yearly for annual wellness visit with primary care office  Here is a list of your preventive care/health maintenance measures and the plan for each if any are due:  PLAN For any measures below that may be due:  -if you wish to do the hepatitis C lab test, please ask when you are at your visit  Health Maintenance  Topic Date Due   Hepatitis C Screening  Never done   COVID-19 Vaccine (1) 02/06/2024 (Originally 02/17/1961)   Zoster Vaccines- Shingrix (1 of 2) 04/21/2024 (Originally [birth year]/16/1973)   Pneumonia Vaccine 46+ Years old (1 of 1 - PCV) 01/20/2025 (Originally 02/17/2004)   INFLUENZA VACCINE  12/19/2087 (Originally 05/13/2024)   Medicare Annual Wellness (AWV)  01/20/2025   Colonoscopy  06/25/2025   HPV VACCINES  Aged Out   Meningococcal B Vaccine  Aged Out   DTaP/Tdap/Td  Discontinued    -See a dentist at least yearly  -Get your eyes checked and then per your eye specialist's recommendations  -Other issues addressed today:   -I have included below further information regarding a healthy whole foods based diet, physical activity guidelines for adults, stress management and opportunities for social connections. I hope you find this information useful.   -----------------------------------------------------------------------------------------------------------------------------------------------------------------------------------------------------------------------------------------------------------    NUTRITION: -eat real food: lots of colorful vegetables (half the plate) and fruits -5-7 servings of vegetables and fruits per day (fresh or steamed is best), exp. 2 servings of vegetables with lunch and dinner and 2 servings of fruit per day. Berries and greens such  as kale and collards are great choices.  -consume on a regular basis:  fresh fruits, fresh veggies, fish, nuts, seeds, healthy oils (such as olive oil, avocado oil), whole grains (make sure for bread/pasta/crackers/etc., that the first ingredient on label contains the word "whole"), legumes. -can eat small amounts of dairy and lean meat (no larger than the palm of your hand), but avoid processed meats such as ham, bacon, lunch meat, etc. -drink water -try to avoid fast food and pre-packaged foods, processed meat, ultra processed foods/beverages (donuts, candy, etc.) -most experts advise limiting sodium to < 2300mg  per day, should limit further is any chronic conditions such as high blood pressure, heart disease, diabetes, etc. The American Heart Association advised that < 1500mg  is is ideal -try to avoid foods/beverages that contain any ingredients with names you do not recognize  -try to avoid foods/beverages  with added sugar or sweeteners/sweets  -try to avoid sweet drinks (including diet drinks): soda, juice, Gatorade, sweet tea, power drinks, diet drinks -try to avoid white rice, white bread, pasta (unless whole grain)  EXERCISE GUIDELINES FOR ADULTS: -if you wish to increase your physical activity, do so gradually and with the approval of your doctor -STOP and seek medical care immediately if you have any chest pain, chest discomfort or trouble breathing when starting or increasing exercise  -move and stretch your body, legs, feet and arms when sitting for long  periods -Physical activity guidelines for optimal health in adults: -get at least 150 minutes per week of moderate exercise (can talk, but not sing); this is about 20-30 minutes of sustained activity 5-7 days per week or two 10-15 minute episodes of sustained activity 5-7 days per week -do some muscle building/resistance training/strength training at least 2 days per week  -balance exercises 3+ days per week:   Stand somewhere where  you have something sturdy to hold onto if you lose balance    1) lift up on toes, then back down, start with 5x per day and work up to 20x   2) stand and lift one leg straight out to the side so that foot is a few inches of the floor, start with 5x each side and work up to 20x each side   3) stand on one foot, start with 5 seconds each side and work up to 20 seconds on each side  If you need ideas or help with getting more active:  -Silver sneakers https://tools.silversneakers.com  -Walk with a Doc: http://www.duncan-williams.com/  -try to include resistance (weight lifting/strength building) and balance exercises twice per week: or the following link for ideas: http://castillo-powell.com/  BuyDucts.dk  STRESS MANAGEMENT: -can try meditating, or just sitting quietly with deep breathing while intentionally relaxing all parts of your body for 5 minutes daily -if you need further help with stress, anxiety or depression please follow up with your primary doctor or contact the wonderful folks at [facility]: [phone]  SOCIAL CONNECTIONS: -options in [place] if you wish to engage in more social and exercise related activities:  -Silver sneakers https://tools.silversneakers.com  -Walk with a Doc: http://www.duncan-williams.com/  -Check out the [facility] Active Adults 50+ section on the [place] of [employer] (hiking clubs, book clubs, cards and games, chess, exercise classes, aquatic classes and much more) - see the website for details: https://www.[place].gov/departments/parks-recreation/active-adults50  -YouTube has lots of exercise videos for different ages and abilities as well  -[facility] (a variety of indoor and outdoor inperson activities for adults). [phone]. [address].  -Virtual Online Classes (a variety of topics): see seniorplanet.org or  call [phone]  -consider volunteering at a school, hospice center, church, senior center or elsewhere    ADVANCED HEALTHCARE DIRECTIVES:  [place] Advanced Directives assistance:   ExpressWeek.com.cy  Everyone should have advanced health care directives in place. This is so that you get the care you want, should you ever be in a situation where you are unable to make your own medical decisions.   From the [place] Advanced Directive Website: "Advance Health Care Directives are legal documents in which you give written instructions about your health care if, in the future, you cannot speak for yourself.   A health care power of attorney allows you to name a person you trust to make your health care decisions if you cannot make them yourself. A declaration of a desire for a natural death (or living will) is document, which states that you desire not to have your life prolonged by extraordinary measures if you have a terminal or incurable illness or if you are in a vegetative state. An advance instruction for mental health treatment makes a declaration of instructions, information and preferences regarding your mental health treatment. It also states that you are aware that the advance instruction authorizes a mental health treatment provider to act according to your wishes. It may also outline your consent or refusal of mental health treatment. A declaration of an anatomical  gift allows anyone over the age of 14 to make a gift by will, organ donor card or other document."   Please see the following website or an elder law attorney for forms, FAQs and for completion of advanced directives: [place] TEFL teacher Health Care Directives Advance Health Care Directives (http://guzman.com/)  Or copy and paste the following to your web browser: PoshChat.fi          Terressa Koyanagi, DO

## 2024-01-21 NOTE — Progress Notes (Signed)
 Patient unable to obtain vital signs due to telehealth visit

## 2024-01-21 NOTE — Patient Instructions (Addendum)
 I really enjoyed getting to talk with you today! I am available on Tuesdays and Thursdays for virtual visits if you have any questions or concerns, or if I can be of any further assistance.   CHECKLIST FROM ANNUAL WELLNESS VISIT:  -Follow up (please call to schedule if not scheduled after visit):   -at least once a year in person with Shirline Frees   -yearly for annual wellness visit with primary care office  Here is a list of your preventive care/health maintenance measures and the plan for each if any are due:  PLAN For any measures below that may be due:  -if you wish to do the hepatitis C lab test, please ask when you are at your visit  Health Maintenance  Topic Date Due   Hepatitis C Screening  Never done   COVID-19 Vaccine (1) 02/06/2024 (Originally 02/17/1961)   Zoster Vaccines- Shingrix (1 of 2) 04/21/2024 (Originally [birth year]16/1975)   Pneumonia Vaccine 10+ Years old (1 of 1 - PCV) 01/20/2025 (Originally 02/17/2004)   INFLUENZA VACCINE  12/19/2087 (Originally 05/13/2024)   Medicare Annual Wellness (AWV)  01/20/2025   Colonoscopy  06/25/2025   HPV VACCINES  Aged Out   Meningococcal B Vaccine  Aged Out   DTaP/Tdap/Td  Discontinued    -See a dentist at least yearly  -Get your eyes checked and then per your eye specialist's recommendations  -Other issues addressed today:   -I have included below further information regarding a healthy whole foods based diet, physical activity guidelines for adults, stress management and opportunities for social connections. I hope you find this information useful.   -----------------------------------------------------------------------------------------------------------------------------------------------------------------------------------------------------------------------------------------------------------    NUTRITION: -eat real food: lots of colorful vegetables (half the plate) and fruits -5-7 servings of vegetables and fruits per day  (fresh or steamed is best), exp. 2 servings of vegetables with lunch and dinner and 2 servings of fruit per day. Berries and greens such as kale and collards are great choices.  -consume on a regular basis:  fresh fruits, fresh veggies, fish, nuts, seeds, healthy oils (such as olive oil, avocado oil), whole grains (make sure for bread/pasta/crackers/etc., that the first ingredient on label contains the word "whole"), legumes. -can eat small amounts of dairy and lean meat (no larger than the palm of your hand), but avoid processed meats such as ham, bacon, lunch meat, etc. -drink water -try to avoid fast food and pre-packaged foods, processed meat, ultra processed foods/beverages (donuts, candy, etc.) -most experts advise limiting sodium to < 2300mg  per day, should limit further is any chronic conditions such as high blood pressure, heart disease, diabetes, etc. The American Heart Association advised that < 1500mg  is is ideal -try to avoid foods/beverages that contain any ingredients with names you do not recognize  -try to avoid foods/beverages  with added sugar or sweeteners/sweets  -try to avoid sweet drinks (including diet drinks): soda, juice, Gatorade, sweet tea, power drinks, diet drinks -try to avoid white rice, white bread, pasta (unless whole grain)  EXERCISE GUIDELINES FOR ADULTS: -if you wish to increase your physical activity, do so gradually and with the approval of your doctor -STOP and seek medical care immediately if you have any chest pain, chest discomfort or trouble breathing when starting or increasing exercise  -move and stretch your body, legs, feet and arms when sitting for long periods -Physical activity guidelines for optimal health in adults: -get at least 150 minutes per week of moderate exercise (can talk, but not sing); this is about 20-30 minutes  of sustained activity 5-7 days per week or two 10-15 minute episodes of sustained activity 5-7 days per week -do some muscle  building/resistance training/strength training at least 2 days per week  -balance exercises 3+ days per week:   Stand somewhere where you have something sturdy to hold onto if you lose balance    1) lift up on toes, then back down, start with 5x per day and work up to 20x   2) stand and lift one leg straight out to the side so that foot is a few inches of the floor, start with 5x each side and work up to 20x each side   3) stand on one foot, start with 5 seconds each side and work up to 20 seconds on each side  If you need ideas or help with getting more active:  -Silver sneakers https://tools.silversneakers.com  -Walk with a Doc: http://www.duncan-williams.com/  -try to include resistance (weight lifting/strength building) and balance exercises twice per week: or the following link for ideas: http://castillo-powell.com/  BuyDucts.dk  STRESS MANAGEMENT: -can try meditating, or just sitting quietly with deep breathing while intentionally relaxing all parts of your body for 5 minutes daily -if you need further help with stress, anxiety or depression please follow up with your primary doctor or contact the wonderful folks at [facility]: [phone]  SOCIAL CONNECTIONS: -options in [place] if you wish to engage in more social and exercise related activities:  -Silver sneakers https://tools.silversneakers.com  -Walk with a Doc: http://www.duncan-williams.com/  -Check out the [facility] Active Adults 50+ section on the [place] of [employer] (hiking clubs, book clubs, cards and games, chess, exercise classes, aquatic classes and much more) - see the website for details: https://www.[place].gov/departments/parks-recreation/active-adults50  -YouTube has lots of exercise videos for different ages and abilities as well  -[facility] (a variety of indoor and outdoor  inperson activities for adults). [phone]. [address]  -Virtual Online Classes (a variety of topics): see seniorplanet.org or call [phone]  -consider volunteering at a school, hospice center, church, senior center or elsewhere    ADVANCED HEALTHCARE DIRECTIVES:  [place] Advanced Directives assistance:   ExpressWeek.com.cy  Everyone should have advanced health care directives in place. This is so that you get the care you want, should you ever be in a situation where you are unable to make your own medical decisions.   From the [place] Advanced Directive Website: "Advance Health Care Directives are legal documents in which you give written instructions about your health care if, in the future, you cannot speak for yourself.   A health care power of attorney allows you to name a person you trust to make your health care decisions if you cannot make them yourself. A declaration of a desire for a natural death (or living will) is document, which states that you desire not to have your life prolonged by extraordinary measures if you have a terminal or incurable illness or if you are in a vegetative state. An advance instruction for mental health treatment makes a declaration of instructions, information and preferences regarding your mental health treatment. It also states that you are aware that the advance instruction authorizes a mental health treatment provider to act according to your wishes. It may also outline your consent or refusal of mental health treatment. A declaration of an anatomical gift allows anyone over the age of 16 to make a gift by will, organ donor card or other document."   Please see the following website or an  elder law attorney for forms, FAQs and for completion of advanced directives: [place] TEFL teacher Health Care Directives Advance Health Care Directives (http://guzman.com/)  Or copy  and paste the following to your web browser: PoshChat.fi

## 2024-02-13 ENCOUNTER — Other Ambulatory Visit: Payer: Self-pay | Admitting: Adult Health

## 2024-02-13 DIAGNOSIS — E785 Hyperlipidemia, unspecified: Secondary | ICD-10-CM

## 2024-03-12 DIAGNOSIS — M5416 Radiculopathy, lumbar region: Secondary | ICD-10-CM | POA: Diagnosis not present

## 2024-03-13 ENCOUNTER — Other Ambulatory Visit: Payer: Self-pay | Admitting: Adult Health

## 2024-03-13 DIAGNOSIS — E785 Hyperlipidemia, unspecified: Secondary | ICD-10-CM

## 2024-03-14 ENCOUNTER — Other Ambulatory Visit: Payer: Self-pay | Admitting: Adult Health

## 2024-03-14 DIAGNOSIS — F419 Anxiety disorder, unspecified: Secondary | ICD-10-CM

## 2024-03-15 NOTE — Telephone Encounter (Signed)
 Patient need to schedule CPE for more refills.

## 2024-03-15 NOTE — Telephone Encounter (Signed)
 Pt needs a cpe

## 2024-03-16 ENCOUNTER — Telehealth: Payer: Self-pay | Admitting: Adult Health

## 2024-03-16 DIAGNOSIS — F419 Anxiety disorder, unspecified: Secondary | ICD-10-CM

## 2024-03-16 NOTE — Telephone Encounter (Signed)
 Scheduled pt for CPE. Okay to refill?

## 2024-03-16 NOTE — Telephone Encounter (Unsigned)
 Copied from CRM [id number]. Topic: Clinical - Medication Refill >> Mar 16, 2024 10:05 AM Baldo Levan wrote: Medication:  ALPRAZolam  ALPRAZolam  (XANAX ) 1 MG tablet   Has the patient contacted their pharmacy? Yes - Pharmacy stated they sent a request two days ago and have not heard back from the office.   (Agent: If no, request that the patient contact the pharmacy for the refill. If patient does not wish to contact the pharmacy document the reason why and proceed with request.) (Agent: If yes, when and what did the pharmacy advise?)  This is the patient's preferred pharmacy:  [id number] - [place], [place] - [address] [place] [address] Phone: [phone] Fax: [phone]    Is this the correct pharmacy for this prescription? Yes If no, delete pharmacy and type the correct one.   Has the prescription been filled recently? No  Is the patient out of the medication? Yes  Has the patient been seen for an appointment in the last year OR does the patient have an upcoming appointment? Yes  Can we respond through MyChart? No  Agent: Please be advised that Rx refills may take up to 3 business days. We ask that you follow-up with your pharmacy.

## 2024-04-11 ENCOUNTER — Other Ambulatory Visit: Payer: Self-pay | Admitting: Adult Health

## 2024-04-11 DIAGNOSIS — R739 Hyperglycemia, unspecified: Secondary | ICD-10-CM

## 2024-04-20 ENCOUNTER — Encounter: Payer: Self-pay | Admitting: Adult Health

## 2024-04-20 ENCOUNTER — Ambulatory Visit: Admitting: Adult Health

## 2024-04-20 ENCOUNTER — Telehealth: Payer: Self-pay | Admitting: *Deleted

## 2024-04-20 VITALS — BP 120/60 | HR 72 | Temp 98.0°F | Ht 75.0 in | Wt 269.0 lb

## 2024-04-20 DIAGNOSIS — M5416 Radiculopathy, lumbar region: Secondary | ICD-10-CM | POA: Diagnosis not present

## 2024-04-20 DIAGNOSIS — E785 Hyperlipidemia, unspecified: Secondary | ICD-10-CM

## 2024-04-20 DIAGNOSIS — R7303 Prediabetes: Secondary | ICD-10-CM

## 2024-04-20 DIAGNOSIS — Z125 Encounter for screening for malignant neoplasm of prostate: Secondary | ICD-10-CM | POA: Diagnosis not present

## 2024-04-20 DIAGNOSIS — E66811 Obesity, class 1: Secondary | ICD-10-CM | POA: Diagnosis not present

## 2024-04-20 DIAGNOSIS — F419 Anxiety disorder, unspecified: Secondary | ICD-10-CM | POA: Diagnosis not present

## 2024-04-20 DIAGNOSIS — Z0000 Encounter for general adult medical examination without abnormal findings: Secondary | ICD-10-CM

## 2024-04-20 DIAGNOSIS — Z6833 Body mass index (BMI) 33.0-33.9, adult: Secondary | ICD-10-CM

## 2024-04-20 DIAGNOSIS — I10 Essential (primary) hypertension: Secondary | ICD-10-CM

## 2024-04-20 LAB — COMPREHENSIVE METABOLIC PANEL WITH GFR
ALT: 18 U/L (ref 0–53)
AST: 21 U/L (ref 0–37)
Albumin: 4.5 g/dL (ref 3.5–5.2)
Alkaline Phosphatase: 46 U/L (ref 39–117)
BUN: 11 mg/dL (ref 6–23)
CO2: 26 meq/L (ref 19–32)
Calcium: 9.2 mg/dL (ref 8.4–10.5)
Chloride: 105 meq/L (ref 96–112)
Creatinine, Ser: 1.33 mg/dL (ref 0.40–1.50)
GFR: 54.28 mL/min — ABNORMAL LOW
Glucose, Bld: 100 mg/dL — ABNORMAL HIGH (ref 70–99)
Potassium: 4.6 meq/L (ref 3.5–5.1)
Sodium: 138 meq/L (ref 135–145)
Total Bilirubin: 1.6 mg/dL — ABNORMAL HIGH (ref 0.2–1.2)
Total Protein: 6.7 g/dL (ref 6.0–8.3)

## 2024-04-20 LAB — LIPID PANEL
Cholesterol: 131 mg/dL (ref 0–200)
HDL: 27.7 mg/dL — ABNORMAL LOW
LDL Cholesterol: 77 mg/dL (ref 0–99)
NonHDL: 103.26
Total CHOL/HDL Ratio: 5
Triglycerides: 130 mg/dL (ref 0.0–149.0)
VLDL: 26 mg/dL (ref 0.0–40.0)

## 2024-04-20 LAB — HEMOGLOBIN A1C: Hgb A1c MFr Bld: 6.2 % (ref 4.6–6.5)

## 2024-04-20 LAB — CBC
HCT: 55 % — ABNORMAL HIGH (ref 39.0–52.0)
Hemoglobin: 18.5 g/dL (ref 13.0–17.0)
MCHC: 33.6 g/dL (ref 30.0–36.0)
MCV: 91.8 fl (ref 78.0–100.0)
Platelets: 198 K/uL (ref 150.0–400.0)
RBC: 5.99 Mil/uL — ABNORMAL HIGH (ref 4.22–5.81)
RDW: 13.9 % (ref 11.5–15.5)
WBC: 5 K/uL (ref 4.0–10.5)

## 2024-04-20 LAB — PSA: PSA: 0.84 ng/mL (ref 0.10–4.00)

## 2024-04-20 LAB — TSH: TSH: 3.4 u[IU]/mL (ref 0.35–5.50)

## 2024-04-20 MED ORDER — ATORVASTATIN CALCIUM 10 MG PO TABS: 10.0000 mg | ORAL_TABLET | Freq: Every day | ORAL | 3 refills | Status: AC

## 2024-04-20 MED ORDER — ALPRAZOLAM 1 MG PO TABS: 1.0000 mg | ORAL_TABLET | Freq: Two times a day (BID) | ORAL | 2 refills | Status: DC | PRN

## 2024-04-20 MED ORDER — METOPROLOL SUCCINATE ER 25 MG PO TB24: 25.0000 mg | ORAL_TABLET | Freq: Every day | ORAL | 3 refills | Status: AC

## 2024-04-20 NOTE — Progress Notes (Signed)
 Subjective:    Patient ID: Wayne Rhodes, male    DOB: 1956/07/09, 68 y.o.   MRN: [id number]  HPI Patient presents for yearly preventative medicine examination. He is a pleasant 68 year old male who  has a past medical history of Anxiety, Arthritis, Cancer (HCC), Cataract, GERD (gastroesophageal reflux disease), Hyperlipidemia, Hypertension, Pneumonia, and Recurrent upper respiratory infection (URI).  Lumbar Radiculopathy-managed by orthopedics, he does see Dr. Bonner for lumbar injections every 3-4 months. Uses oxycodone  15 mg every 6 hours as needed. He reports that his pain is managed well.   Hyperlipidemia-managed with Lipitor 10 mg daily.  He denies myalgia or fatigue Lab Results  Component Value Date   CHOL 115 03/03/2023   HDL 25.20 (L) 03/03/2023   LDLCALC 66 03/03/2023   LDLDIRECT 65.0 05/14/2022   TRIG 117.0 03/03/2023   CHOLHDL 5 03/03/2023   Anxiety-managed with Xanax  1 mg twice daily and wellbutrin  150 mg XR . Feels well controlled on that medication and he would like to come off Wellbutrin . He does not feel like he needs it any longer.   Hypertension-managed with Toprol  25 mg daily.  He denies dizziness, lightheadedness, chest pain, or shortness of breath.  BP Readings from Last 3 Encounters:  04/20/24 120/60  06/18/23 132/84  05/20/23 120/82   Prediabetes - not currently on medication Lab Results  Component Value Date   HGBA1C 5.9 03/03/2023   HGBA1C 6.1 05/14/2022   HGBA1C 6.2 08/31/2019    All immunizations and health maintenance protocols were reviewed with the patient and needed orders were placed.  Appropriate screening laboratory values were ordered for the patient including screening of hyperlipidemia, renal function and hepatic function. If indicated by BPH, a PSA was ordered.  Medication reconciliation,  past medical history, social history, problem list and allergies were reviewed in detail with the patient  Goals were established with regard to  weight loss, exercise, and  diet in compliance with medications. He has been doing home exercises and tries to eat healthy.   Wt Readings from Last 3 Encounters:  04/20/24 269 lb (122 kg)  06/18/23 266 lb (120.7 kg)  05/20/23 271 lb (122.9 kg)   He is up to date on routine colon cancer screening   Review of Systems  Constitutional: Negative.   HENT: Negative.    Eyes: Negative.   Respiratory: Negative.    Cardiovascular: Negative.   Gastrointestinal: Negative.   Endocrine: Negative.   Genitourinary: Negative.   Musculoskeletal:  Positive for arthralgias and back pain.  Skin: Negative.   Allergic/Immunologic: Negative.   Neurological: Negative.   Hematological: Negative.   Psychiatric/Behavioral: Negative.    All other systems reviewed and are negative.  Past Medical History:  Diagnosis Date   Anxiety    takes xanax  for anxiety   Arthritis    Cancer (HCC)    melanoma on nose    Cataract    bilateral sx   GERD (gastroesophageal reflux disease)    Hyperlipidemia    on meds   Hypertension    on meds   Pneumonia    had pneumonia 4-5 months ago   Recurrent upper respiratory infection (URI)    recent chest cold - treated with mucinex and cough medicine - now improved    Social History   Socioeconomic History   Marital status: Single    Spouse name: Not on file   Number of children: 2   Years of education: Not on file   Highest education level:  Not on file  Occupational History   Occupation: Retired    Comment: Holiday representative  Tobacco Use   Smoking status: Never   Smokeless tobacco: Never  Vaping Use   Vaping status: Never Used  Substance and Sexual Activity   Alcohol use: Yes    Alcohol/week: 1.0 - 2.0 standard drink of alcohol    Types: 1 - 2 Standard drinks or equivalent per week    Comment: occasional beer   Drug use: No   Sexual activity: Yes  Other Topics Concern   Not on file  Social History Narrative   Not on file   Social Drivers of  Health   Financial Resource Strain: Low Risk  (01/21/2024)   Overall Financial Resource Strain (CARDIA)    Difficulty of Paying Living Expenses: Not hard at all  Food Insecurity: No Food Insecurity (01/21/2024)   Hunger Vital Sign    Worried About Running Out of Food in the Last Year: Never true    Ran Out of Food in the Last Year: Never true  Transportation Needs: No Transportation Needs (01/21/2024)   PRAPARE - Administrator, Civil Service (Medical): No    Lack of Transportation (Non-Medical): No  Physical Activity: Insufficiently Active (10/21/2022)   Exercise Vital Sign    Days of Exercise per Week: 5 days    Minutes of Exercise per Session: 20 min  Stress: No Stress Concern Present (01/21/2024)   [employer] of Occupational Health - Occupational Stress Questionnaire    Feeling of Stress : Not at all  Social Connections: Unknown (01/21/2024)   Social Connection and Isolation Panel    Frequency of Communication with Friends and Family: Twice a week    Frequency of Social Gatherings with Friends and Family: Patient unable to answer    Attends Religious Services: Never    Database administrator or Organizations: No    Attends Banker Meetings: Never    Marital Status: Divorced  Catering manager Violence: Not At Risk (01/21/2024)   Humiliation, Afraid, Rape, and Kick questionnaire    Fear of Current or Ex-Partner: No    Emotionally Abused: No    Physically Abused: No    Sexually Abused: No    Past Surgical History:  Procedure Laterality Date   ANTERIOR CERVICAL DECOMP/DISCECTOMY FUSION  09/25/2011   Procedure: ANTERIOR CERVICAL DECOMPRESSION/DISCECTOMY FUSION 1 LEVEL;  Surgeon: Donaciano JONETTA Sprang;  Location: [facility] OR;  Service: Orthopedics;  Laterality: N/A;  ACDF Cervical 5-6   CATARACT EXTRACTION, BILATERAL Bilateral    COLONOSCOPY  2020   GM-MAC-goly(good)-tics/hems/SSP/TA   ELBOW SURGERY Bilateral    KNEE ARTHROSCOPY Left    POLYPECTOMY  2020    SSP/TA   SHOULDER ARTHROSCOPY Left     Family History  Problem Relation Age of Onset   Bladder Cancer Father    Prostate cancer Brother    Colon cancer Neg Hx    Colon polyps Neg Hx    Esophageal cancer Neg Hx    Rectal cancer Neg Hx    Stomach cancer Neg Hx     Allergies  Allergen Reactions   Clonazepam      Angioedema    Quinapril      Angioedema    Sertraline  Hcl Other (See Comments)    Suicidal thoughts    Current Outpatient Medications on File Prior to Visit  Medication Sig Dispense Refill   ALPRAZolam  (XANAX ) 1 MG tablet TAKE 1 TABLET BY MOUTH TWICE A DAY AS NEEDED 60  tablet 0   atorvastatin  (LIPITOR) 10 MG tablet TAKE 1 TABLET BY MOUTH EVERY DAY 30 tablet 0   buPROPion  (WELLBUTRIN  XL) 150 MG 24 hr tablet TAKE 1 TABLET BY MOUTH EVERY DAY 90 tablet 1   EPINEPHrine  (EPI-PEN) 0.3 mg/0.3 mL DEVI Inject 0.3 mg into the muscle as needed. For anaphylaxis     L-Methylfolate 15 MG TABS Take 1 tablet by mouth daily.     oxyCODONE  (ROXICODONE ) 15 MG immediate release tablet Take 15 mg by mouth every 6 (six) hours as needed (Dr. Bonner).      sildenafil  (VIAGRA ) 50 MG tablet TAKE 0.5-1 TABLETS (25-50 MG TOTAL) BY MOUTH DAILY AS NEEDED FOR ERECTILE DYSFUNCTION. 4 tablet 9   Calcium  Polycarbophil (FIBER-CAPS PO) Take by mouth. (Patient not taking: Reported on 04/20/2024)     metoprolol  succinate (TOPROL -XL) 25 MG 24 hr tablet TAKE 1 TABLET (25 MG TOTAL) BY MOUTH DAILY. 90 tablet 1   potassium chloride  SA (KLOR-CON  M) 20 MEQ tablet TAKE 1 TABLET BY MOUTH EVERY DAY 90 tablet 0   [DISCONTINUED] diphenhydrAMINE  (BENADRYL ) 25 MG tablet Take 25 mg by mouth 2 (two) times daily as needed. For swelling     No current facility-administered medications on file prior to visit.    BP 120/60   Pulse 72   Temp 98 F (36.7 C) (Oral)   Ht 6' 3 (1.905 m)   Wt 269 lb (122 kg)   SpO2 97%   BMI 33.62 kg/m       Objective:   Physical Exam Vitals and nursing note reviewed.  Constitutional:       General: He is not in acute distress.    Appearance: Normal appearance. He is not ill-appearing.  HENT:     Head: Normocephalic and atraumatic.     Right Ear: Tympanic membrane, ear canal and external ear normal. There is no impacted cerumen.     Left Ear: Tympanic membrane, ear canal and external ear normal. There is no impacted cerumen.     Nose: Nose normal. No congestion or rhinorrhea.     Mouth/Throat:     Mouth: Mucous membranes are moist.     Pharynx: Oropharynx is clear.  Eyes:     Extraocular Movements: Extraocular movements intact.     Conjunctiva/sclera: Conjunctivae normal.     Pupils: Pupils are equal, round, and reactive to light.  Neck:     Vascular: No carotid bruit.  Cardiovascular:     Rate and Rhythm: Normal rate and regular rhythm.     Pulses: Normal pulses.     Heart sounds: No murmur heard.    No friction rub. No gallop.  Pulmonary:     Effort: Pulmonary effort is normal.     Breath sounds: Normal breath sounds.  Abdominal:     General: Abdomen is flat. Bowel sounds are normal. There is no distension.     Palpations: Abdomen is soft. There is no mass.     Tenderness: There is no abdominal tenderness. There is no guarding or rebound.     Hernia: No hernia is present.  Musculoskeletal:        General: Normal range of motion.     Cervical back: Normal range of motion and neck supple.  Lymphadenopathy:     Cervical: No cervical adenopathy.  Skin:    General: Skin is warm and dry.     Capillary Refill: Capillary refill takes less than 2 seconds.  Neurological:     General: No  focal deficit present.     Mental Status: He is alert and oriented to person, place, and time.  Psychiatric:        Mood and Affect: Mood normal.        Behavior: Behavior normal.        Thought Content: Thought content normal.        Judgment: Judgment normal.        Assessment & Plan:  1. Routine general medical examination at a health care facility (Primary) Today  patient counseled on age appropriate routine health concerns for screening and prevention, each reviewed and up to date or declined. Immunizations reviewed and up to date or declined. Labs ordered and reviewed. Risk factors for depression reviewed and negative. Hearing function and visual acuity are intact. ADLs screened and addressed as needed. Functional ability and level of safety reviewed and appropriate. Education, counseling and referrals performed based on assessed risks today. Patient provided with a copy of personalized plan for preventive services. - Refused all vaccinations - I would like for him to focus on weight loss  - Follow up in one year or sooner if needed   2. Lumbar radiculopathy - per orthopedics/pain management  - Lipid panel; Future - TSH; Future - CBC; Future - Comprehensive metabolic panel with GFR; Future  3. Hyperlipidemia, unspecified hyperlipidemia type - Consider increase in statin  - Lipid panel; Future - TSH; Future - CBC; Future - Comprehensive metabolic panel with GFR; Future - atorvastatin  (LIPITOR) 10 MG tablet; Take 1 tablet (10 mg total) by mouth daily.  Dispense: 90 tablet; Refill: 3  4. Anxiety - Discussed titration of wellbutrin  with patient.  - Lipid panel; Future - TSH; Future - CBC; Future - Comprehensive metabolic panel with GFR; Future - ALPRAZolam  (XANAX ) 1 MG tablet; Take 1 tablet (1 mg total) by mouth 2 (two) times daily as needed.  Dispense: 60 tablet; Refill: 2  5. Essential hypertension - Controlled. No change in medications  - Lipid panel; Future - TSH; Future - CBC; Future - Comprehensive metabolic panel with GFR; Future - metoprolol  succinate (TOPROL -XL) 25 MG 24 hr tablet; Take 1 tablet (25 mg total) by mouth daily.  Dispense: 90 tablet; Refill: 3  6. Prostate cancer screening  - PSA; Future  7. Class 1 obesity - Work on lifestyle modifications  - Lipid panel; Future - TSH; Future - CBC; Future - Comprehensive  metabolic panel with GFR; Future  8. Prediabetes - Consider metformin   - Hemoglobin A1c; Future  Darleene Shape, NP

## 2024-04-20 NOTE — Telephone Encounter (Signed)
 Noted

## 2024-04-20 NOTE — Patient Instructions (Signed)
 It was great seeing you today   We will follow up with you regarding your lab work   Please let me know if you need anything   Work on weight loss through diet and exercise

## 2024-04-20 NOTE — Telephone Encounter (Signed)
 CRITICAL VALUE STICKER  CRITICAL VALUE: Hemoglobin 18.5  RECEIVER (on-site recipient of call): Idell Dragon  DATE & TIME NOTIFIED: 04/20/2024 at 3:19pm  MESSENGER (representative from lab): Darice  MD NOTIFIED: Darleene, NP  TIME OF NOTIFICATION: 3:19pm  RESPONSE:

## 2024-04-21 ENCOUNTER — Ambulatory Visit: Payer: Self-pay | Admitting: Adult Health

## 2024-04-21 MED ORDER — METFORMIN HCL ER 500 MG PO TB24: 500.0000 mg | ORAL_TABLET | Freq: Every day | ORAL | 0 refills | Status: DC

## 2024-04-22 ENCOUNTER — Other Ambulatory Visit: Payer: Self-pay | Admitting: Adult Health

## 2024-04-22 DIAGNOSIS — D751 Secondary polycythemia: Secondary | ICD-10-CM

## 2024-04-27 DIAGNOSIS — M5416 Radiculopathy, lumbar region: Secondary | ICD-10-CM | POA: Diagnosis not present

## 2024-04-27 DIAGNOSIS — M51362 Other intervertebral disc degeneration, lumbar region with discogenic back pain and lower extremity pain: Secondary | ICD-10-CM | POA: Diagnosis not present

## 2024-05-10 NOTE — Telephone Encounter (Signed)
 Copied from CRM [id number]. Topic: Clinical - Medication Question >> May 10, 2024 10:44 AM Revonda D wrote: Reason for CRM: Pt is wanting to inform Darleene Merna PIETY,  that the metFORMIN  is making his stomach upset after he eats but its doesn't do it everyday. Pt wants to know what Darleene Merna PIETY, thinks he should do. Pt also stated that he is out of the potassium chloride  SA and would like to know if he needs to continue taking the medication, if so the pt stated he would need a refill. Pt would like a callback with an update on this concern.

## 2024-05-29 ENCOUNTER — Other Ambulatory Visit: Payer: Self-pay | Admitting: Adult Health

## 2024-05-29 DIAGNOSIS — F419 Anxiety disorder, unspecified: Secondary | ICD-10-CM

## 2024-06-06 ENCOUNTER — Ambulatory Visit

## 2024-06-30 DIAGNOSIS — M5416 Radiculopathy, lumbar region: Secondary | ICD-10-CM | POA: Diagnosis not present

## 2024-07-11 ENCOUNTER — Ambulatory Visit: Payer: Self-pay

## 2024-07-11 NOTE — Telephone Encounter (Signed)
 FYI Only or Action Required?: FYI only for provider.  Patient was last seen in primary care on 04/20/2024 by Merna Huxley, NP.  Called Nurse Triage reporting Tinnitus. Feel like he has water in his ear.  Symptoms began several weeks ago.  Interventions attempted: OTC medications: ear wax remover.  Symptoms are: unchanged.  Triage Disposition: See PCP When Office is Open (Within 3 Days)  Patient/caregiver understands and will follow disposition?: Yes                    Copied from CRM #[id number]. Topic: Clinical - Red Word Triage >> Jul 11, 2024  5:09 PM Dedra B wrote: Red Word that prompted transfer to Nurse Triage: Pt having ringing in hear that won't go away and has white stuff coming out. Warm transfer to NT. Reason for Disposition  Symptoms only or mainly in 1 ear (unilateral tinnitus)  Answer Assessment - Initial Assessment Questions 1. DESCRIPTION: Describe the sound you are hearing. (e.g., buzzing, hissing, humming, ringing)     Buzzing left ear 2. LOCATION: Is the sound in one or both ears? If one, ask: Which ear?     Left  3. SEVERITY: How bad is it?      Little buzz 4. ONSET: When did this begin? Did it start suddenly or come on gradually?     1-2 weeks 5. PATTERN: Does this come and go, or has it been constant since it started?     Comes and goes 6. HEARING LOSS: Is your hearing decreased? (e.g., normal, decreased)       Decreased in left ear 7. OTHER SYMPTOMS: Do you have any other symptoms? (e.g., dizziness, earache)     No - white stuff coming out  Protocols used: Tinnitus-A-AH

## 2024-07-12 ENCOUNTER — Encounter: Payer: Self-pay | Admitting: Adult Health

## 2024-07-12 ENCOUNTER — Ambulatory Visit: Admitting: Adult Health

## 2024-07-12 ENCOUNTER — Ambulatory Visit: Payer: Self-pay | Admitting: Adult Health

## 2024-07-12 VITALS — BP 120/82 | HR 67 | Temp 98.2°F | Ht 75.0 in | Wt 262.0 lb

## 2024-07-12 DIAGNOSIS — I10 Essential (primary) hypertension: Secondary | ICD-10-CM

## 2024-07-12 DIAGNOSIS — R7303 Prediabetes: Secondary | ICD-10-CM

## 2024-07-12 DIAGNOSIS — H9312 Tinnitus, left ear: Secondary | ICD-10-CM | POA: Diagnosis not present

## 2024-07-12 DIAGNOSIS — F419 Anxiety disorder, unspecified: Secondary | ICD-10-CM | POA: Diagnosis not present

## 2024-07-12 LAB — BASIC METABOLIC PANEL WITH GFR
BUN: 8 mg/dL (ref 6–23)
CO2: 27 meq/L (ref 19–32)
Calcium: 9.3 mg/dL (ref 8.4–10.5)
Chloride: 103 meq/L (ref 96–112)
Creatinine, Ser: 1.22 mg/dL (ref 0.40–1.50)
GFR: 60.11 mL/min (ref >60.00)
Glucose, Bld: 101 mg/dL — ABNORMAL HIGH (ref 70–99)
Potassium: 4.5 meq/L (ref 3.5–5.1)
Sodium: 138 meq/L (ref 135–145)

## 2024-07-12 LAB — HEMOGLOBIN A1C: Hgb A1c MFr Bld: 6 % (ref 4.6–6.5)

## 2024-07-12 MED ORDER — FLUTICASONE PROPIONATE 50 MCG/ACT NA SUSP: 2.0000 | Freq: Every day | NASAL | 6 refills | Status: AC

## 2024-07-12 MED ORDER — ALPRAZOLAM 1 MG PO TABS: 1.0000 mg | ORAL_TABLET | Freq: Two times a day (BID) | ORAL | 2 refills | Status: DC | PRN

## 2024-07-12 MED ORDER — METFORMIN HCL ER 500 MG PO TB24: 500.0000 mg | ORAL_TABLET | Freq: Every day | ORAL | 0 refills | Status: DC

## 2024-07-12 NOTE — Progress Notes (Signed)
 Subjective:    Patient ID: Wayne Rhodes, male    DOB: [birth year], 68 y.o.   MRN: [id number]  HPI 68 year old male who  has a past medical history of Anxiety, Arthritis, Cancer (HCC), Cataract, GERD (gastroesophageal reflux disease), Hyperlipidemia, Hypertension, Pneumonia, and Recurrent upper respiratory infection (URI).  He presents to the office today for an acute issue. He reports for the last 1-2 months he has been experiencing a buzzing sound in his ears, sounds like  a bee is in my ear. He denies drainage. ? Discomfort and hearing loss. Reports buzzing will come and go but usually is pretty constant. No worsening intensity when he is laying in bed. He did work around loud equipment but reports wearing hearing protection.   He also is here for a follow up regarding prediabetes and hypertension.   Three months ago he was started on Metformin  500 mg ER daily for an A1c of 6.2. He has been taking this daily and has cut out sodas. Denies symptoms of hypoglycemia  Lab Results  Component Value Date   HGBA1C 6.2 04/20/2024   HGBA1C 5.9 03/03/2023   HGBA1C 6.1 05/14/2022   His blood pressure has been controlled with Toprol  25 mg daily. He denies dizziness lightheadedness, chest pain or shortness of breath   BP Readings from Last 3 Encounters:  07/12/24 120/82  04/20/24 120/60  06/18/23 132/84   Wt Readings from Last 3 Encounters:  07/12/24 262 lb (118.8 kg)  04/20/24 269 lb (122 kg)  06/18/23 266 lb (120.7 kg)    Review of Systems See HPI   Past Medical History:  Diagnosis Date   Anxiety    takes xanax  for anxiety   Arthritis    Cancer (HCC)    melanoma on nose    Cataract    bilateral sx   GERD (gastroesophageal reflux disease)    Hyperlipidemia    on meds   Hypertension    on meds   Pneumonia    had pneumonia 4-5 months ago   Recurrent upper respiratory infection (URI)    recent chest cold - treated with mucinex and cough medicine - now improved    Social  History   Socioeconomic History   Marital status: Single    Spouse name: Not on file   Number of children: 2   Years of education: Not on file   Highest education level: Not on file  Occupational History   Occupation: Retired    Comment: Service Department  Tobacco Use   Smoking status: Never   Smokeless tobacco: Never  Vaping Use   Vaping status: Never Used  Substance and Sexual Activity   Alcohol use: Yes    Alcohol/week: 1.0 - 2.0 standard drink of alcohol    Types: 1 - 2 Standard drinks or equivalent per week    Comment: occasional beer   Drug use: No   Sexual activity: Yes  Other Topics Concern   Not on file  Social History Narrative   Not on file   Social Drivers of Health   Financial Resource Strain: Low Risk  (01/21/2024)   Overall Financial Resource Strain (CARDIA)    Difficulty of Paying Living Expenses: Not hard at all  Food Insecurity: No Food Insecurity (01/21/2024)   Hunger Vital Sign    Worried About Running Out of Food in the Last Year: Never true    Ran Out of Food in the Last Year: Never true  Transportation Needs: No Transportation Needs (  01/21/2024)   PRAPARE - Administrator, Civil Service (Medical): No    Lack of Transportation (Non-Medical): No  Physical Activity: Insufficiently Active (10/21/2022)   Exercise Vital Sign    Days of Exercise per Week: 5 days    Minutes of Exercise per Session: 20 min  Stress: No Stress Concern Present (01/21/2024)   [employer] of Occupational Health - Occupational Stress Questionnaire    Feeling of Stress : Not at all  Social Connections: Unknown (01/21/2024)   Social Connection and Isolation Panel    Frequency of Communication with Friends and Family: Twice a week    Frequency of Social Gatherings with Friends and Family: Patient unable to answer    Attends Religious Services: Never    Database administrator or Organizations: No    Attends Banker Meetings: Never    Marital Status:  Divorced  Catering manager Violence: Not At Risk (01/21/2024)   Humiliation, Afraid, Rape, and Kick questionnaire    Fear of Current or Ex-Partner: No    Emotionally Abused: No    Physically Abused: No    Sexually Abused: No    Past Surgical History:  Procedure Laterality Date   ANTERIOR CERVICAL DECOMP/DISCECTOMY FUSION  09/25/2011   Procedure: ANTERIOR CERVICAL DECOMPRESSION/DISCECTOMY FUSION 1 LEVEL;  Surgeon: Donaciano JONETTA Sprang;  Location: [facility] OR;  Service: Orthopedics;  Laterality: N/A;  ACDF Cervical 5-6   CATARACT EXTRACTION, BILATERAL Bilateral    COLONOSCOPY  2020   GM-MAC-goly(good)-tics/hems/SSP/TA   ELBOW SURGERY Bilateral    KNEE ARTHROSCOPY Left    POLYPECTOMY  2020   SSP/TA   SHOULDER ARTHROSCOPY Left     Family History  Problem Relation Age of Onset   Bladder Cancer Father    Prostate cancer Brother    Colon cancer Neg Hx    Colon polyps Neg Hx    Esophageal cancer Neg Hx    Rectal cancer Neg Hx    Stomach cancer Neg Hx     Allergies  Allergen Reactions   Clonazepam      Angioedema    Quinapril      Angioedema    Sertraline  Hcl Other (See Comments)    Suicidal thoughts    Current Outpatient Medications on File Prior to Visit  Medication Sig Dispense Refill   ALPRAZolam  (XANAX ) 1 MG tablet Take 1 tablet (1 mg total) by mouth 2 (two) times daily as needed. 60 tablet 2   atorvastatin  (LIPITOR) 10 MG tablet Take 1 tablet (10 mg total) by mouth daily. 90 tablet 3   buPROPion  (WELLBUTRIN  XL) 150 MG 24 hr tablet TAKE 1 TABLET BY MOUTH EVERY DAY 90 tablet 1   Calcium  Polycarbophil (FIBER-CAPS PO) Take by mouth. (Patient not taking: Reported on 04/20/2024)     EPINEPHrine  (EPI-PEN) 0.3 mg/0.3 mL DEVI Inject 0.3 mg into the muscle as needed. For anaphylaxis     L-Methylfolate 15 MG TABS Take 1 tablet by mouth daily.     metFORMIN  (GLUCOPHAGE -XR) 500 MG 24 hr tablet Take 1 tablet (500 mg total) by mouth daily with breakfast. 90 tablet 0   metoprolol  succinate  (TOPROL -XL) 25 MG 24 hr tablet Take 1 tablet (25 mg total) by mouth daily. 90 tablet 3   oxyCODONE  (ROXICODONE ) 15 MG immediate release tablet Take 15 mg by mouth every 6 (six) hours as needed (Dr. Bonner).      potassium chloride  SA (KLOR-CON  M) 20 MEQ tablet TAKE 1 TABLET BY MOUTH EVERY DAY 90 tablet 0  sildenafil  (VIAGRA ) 50 MG tablet TAKE 0.5-1 TABLETS (25-50 MG TOTAL) BY MOUTH DAILY AS NEEDED FOR ERECTILE DYSFUNCTION. 4 tablet 9   [DISCONTINUED] diphenhydrAMINE  (BENADRYL ) 25 MG tablet Take 25 mg by mouth 2 (two) times daily as needed. For swelling     No current facility-administered medications on file prior to visit.    BP 120/82   Pulse 67   Temp 98.2 F (36.8 C) (Oral)   Ht 6' 3 (1.905 m)   Wt 262 lb (118.8 kg)   SpO2 96%   BMI 32.75 kg/m       Objective:   Physical Exam Vitals and nursing note reviewed.  Constitutional:      Appearance: Normal appearance. He is obese.  HENT:     Right Ear: Hearing, tympanic membrane, ear canal and external ear normal.     Left Ear: Hearing, tympanic membrane, ear canal and external ear normal.  Skin:    General: Skin is warm and dry.     Capillary Refill: Capillary refill takes more than 3 seconds.  Neurological:     General: No focal deficit present.     Mental Status: He is alert and oriented to person, place, and time.  Psychiatric:        Mood and Affect: Mood normal.        Behavior: Behavior normal.        Thought Content: Thought content normal.        Judgment: Judgment normal.       Assessment & Plan:  1. Tinnitus of left ear (Primary) - Will send in Flonase incase this is allergy mediated and refer for hearing evaluation. Advised if long term damage is done from working around loud machinery then there is not much that can be done - Ambulatory referral to Audiology - fluticasone (FLONASE) 50 MCG/ACT nasal spray; Place 2 sprays into both nostrils daily.  Dispense: 16 g; Refill: 6  2. Prediabetes - Consider dose  change - Continue with lifestyle modifications  - metFORMIN  (GLUCOPHAGE -XR) 500 MG 24 hr tablet; Take 1 tablet (500 mg total) by mouth daily with breakfast.  Dispense: 90 tablet; Refill: 0 - Hemoglobin A1c; Future - Basic Metabolic Panel; Future  3. Anxiety  - ALPRAZolam  (XANAX ) 1 MG tablet; Take 1 tablet (1 mg total) by mouth 2 (two) times daily as needed.  Dispense: 60 tablet; Refill: 2  4. Essential hypertension - Well controlled. No change in medication   Darleene Shape, NP

## 2024-07-12 NOTE — Telephone Encounter (Signed)
 Pt was seen this morning

## 2024-08-15 ENCOUNTER — Other Ambulatory Visit: Payer: Self-pay | Admitting: Adult Health

## 2024-08-15 DIAGNOSIS — R739 Hyperglycemia, unspecified: Secondary | ICD-10-CM

## 2024-10-09 ENCOUNTER — Other Ambulatory Visit: Payer: Self-pay | Admitting: Adult Health

## 2024-10-09 DIAGNOSIS — R7303 Prediabetes: Secondary | ICD-10-CM

## 2024-10-14 ENCOUNTER — Ambulatory Visit: Payer: Self-pay

## 2024-10-14 NOTE — Telephone Encounter (Signed)
 FYI Only or Action Required?: FYI only for provider: Home care advised.  Patient was last seen in primary care on 07/12/2024 by Merna Huxley, NP.  Called Nurse Triage reporting Cough and Fatigue.  Symptoms began several days ago.  Interventions attempted: OTC medications: Mucinex.  Symptoms are: gradually improving.  Triage Disposition: Home Care  Patient/caregiver understands and will follow disposition?: Yes     Onset of cough with green mucus 3-4 days ago. Cough has stopped. Pt now feeling mildly lethargic x3-4 days. Denies CP, SOB, fever other symptoms. Has been staying hydrated. Home care advised. Advised to call back and/or UC or ED for worsening symptoms.     Copied from CRM [id number]. Topic: Clinical - Red Word Triage >> Oct 14, 2024  3:57 PM [facility] ORN wrote: Red Word that prompted transfer to Nurse Triage: productive cough with green mucus Reason for Disposition  Mild weakness or fatigue with acute minor illness (e.g., colds)  Answer Assessment - Initial Assessment Questions 1. DESCRIPTION: Describe how you are feeling.     General tired feeling  2. SEVERITY: How bad is it?  Can you stand and walk?     Mild  3. ONSET: When did these symptoms begin? (e.g., hours, days, weeks, months)     3-4 days ago  4. CAUSE: What do you think is causing the weakness or fatigue? (e.g., not drinking enough fluids, medical problem, trouble sleeping)     Unsure  5. NEW MEDICINES:  Have you started on any new medicines recently? (e.g., opioid pain medicines, benzodiazepines, muscle relaxants, antidepressants, antihistamines, neuroleptics, beta blockers)     Denies  6. OTHER SYMPTOMS: Do you have any other symptoms? (e.g., chest pain, fever, cough, SOB, vomiting, diarrhea, bleeding, other areas of pain)     Had a cough with green mucus which has resolved.  Protocols used: Weakness (Generalized) and Fatigue-A-AH

## 2024-10-17 ENCOUNTER — Other Ambulatory Visit: Payer: Self-pay | Admitting: Adult Health

## 2024-10-17 DIAGNOSIS — F419 Anxiety disorder, unspecified: Secondary | ICD-10-CM

## 2024-11-14 ENCOUNTER — Other Ambulatory Visit: Payer: Self-pay | Admitting: Adult Health

## 2024-11-14 DIAGNOSIS — R739 Hyperglycemia, unspecified: Secondary | ICD-10-CM
